# Patient Record
Sex: Male | Born: 1975
Health system: Southern US, Community
[De-identification: ages and names within clinical notes are randomized; demographics above are authoritative.]

## PROBLEM LIST (undated history)

## (undated) DIAGNOSIS — M1990 Unspecified osteoarthritis, unspecified site: Secondary | ICD-10-CM

## (undated) DIAGNOSIS — E669 Obesity, unspecified: Secondary | ICD-10-CM

## (undated) DIAGNOSIS — Z9989 Dependence on other enabling machines and devices: Secondary | ICD-10-CM

## (undated) DIAGNOSIS — Q336 Congenital hypoplasia and dysplasia of lung: Secondary | ICD-10-CM

## (undated) DIAGNOSIS — J302 Other seasonal allergic rhinitis: Secondary | ICD-10-CM

## (undated) DIAGNOSIS — I10 Essential (primary) hypertension: Secondary | ICD-10-CM

## (undated) DIAGNOSIS — R7301 Impaired fasting glucose: Secondary | ICD-10-CM

## (undated) DIAGNOSIS — E785 Hyperlipidemia, unspecified: Secondary | ICD-10-CM

## (undated) DIAGNOSIS — S8991XA Unspecified injury of right lower leg, initial encounter: Secondary | ICD-10-CM

## (undated) DIAGNOSIS — E119 Type 2 diabetes mellitus without complications: Secondary | ICD-10-CM

## (undated) HISTORY — DX: Obesity, unspecified: E66.9

## (undated) HISTORY — PX: OTHER SURGICAL HISTORY: SHX169

## (undated) HISTORY — DX: Impaired fasting glucose: R73.01

## (undated) HISTORY — DX: Hyperlipidemia, unspecified: E78.5

## (undated) HISTORY — DX: Essential (primary) hypertension: I10

## (undated) HISTORY — DX: Other seasonal allergic rhinitis: J30.2

## (undated) HISTORY — DX: Type 2 diabetes mellitus without complications: E11.9

## (undated) HISTORY — DX: Congenital hypoplasia and dysplasia of lung: Q33.6

---

## 2002-11-12 ENCOUNTER — Encounter: Payer: Self-pay | Admitting: Family Medicine

## 2002-11-12 ENCOUNTER — Ambulatory Visit (HOSPITAL_COMMUNITY): Admission: RE | Admit: 2002-11-12 | Discharge: 2002-11-12 | Payer: Self-pay | Admitting: Family Medicine

## 2004-03-09 ENCOUNTER — Ambulatory Visit: Payer: Self-pay | Admitting: Family Medicine

## 2004-05-10 ENCOUNTER — Ambulatory Visit: Payer: Self-pay | Admitting: Family Medicine

## 2004-09-12 ENCOUNTER — Ambulatory Visit: Payer: Self-pay | Admitting: Family Medicine

## 2004-10-20 ENCOUNTER — Ambulatory Visit: Payer: Self-pay | Admitting: Family Medicine

## 2005-01-03 ENCOUNTER — Ambulatory Visit: Payer: Self-pay | Admitting: Family Medicine

## 2005-01-25 ENCOUNTER — Ambulatory Visit: Payer: Self-pay | Admitting: Family Medicine

## 2005-03-06 HISTORY — PX: OTHER SURGICAL HISTORY: SHX169

## 2005-04-27 ENCOUNTER — Ambulatory Visit: Payer: Self-pay | Admitting: Family Medicine

## 2005-07-05 ENCOUNTER — Ambulatory Visit: Payer: Self-pay | Admitting: Family Medicine

## 2005-12-08 ENCOUNTER — Ambulatory Visit: Payer: Self-pay | Admitting: Family Medicine

## 2006-01-18 ENCOUNTER — Ambulatory Visit: Payer: Self-pay | Admitting: Family Medicine

## 2006-03-09 ENCOUNTER — Ambulatory Visit: Payer: Self-pay | Admitting: Family Medicine

## 2006-03-09 ENCOUNTER — Ambulatory Visit (HOSPITAL_COMMUNITY): Admission: RE | Admit: 2006-03-09 | Discharge: 2006-03-09 | Payer: Self-pay | Admitting: Family Medicine

## 2006-04-03 ENCOUNTER — Ambulatory Visit: Payer: Self-pay | Admitting: Family Medicine

## 2006-04-16 ENCOUNTER — Ambulatory Visit (HOSPITAL_COMMUNITY): Admission: RE | Admit: 2006-04-16 | Discharge: 2006-04-16 | Payer: Self-pay | Admitting: Family Medicine

## 2006-08-02 ENCOUNTER — Ambulatory Visit (HOSPITAL_COMMUNITY): Admission: RE | Admit: 2006-08-02 | Discharge: 2006-08-02 | Payer: Self-pay | Admitting: Family Medicine

## 2006-08-02 ENCOUNTER — Ambulatory Visit: Payer: Self-pay | Admitting: Family Medicine

## 2006-11-02 ENCOUNTER — Ambulatory Visit: Payer: Self-pay | Admitting: Family Medicine

## 2006-11-02 LAB — CONVERTED CEMR LAB
CO2: 23 meq/L (ref 19–32)
Calcium: 9.3 mg/dL (ref 8.4–10.5)
Chloride: 101 meq/L (ref 96–112)
Glucose, Bld: 93 mg/dL (ref 70–99)
LDL Cholesterol: 159 mg/dL — ABNORMAL HIGH (ref 0–99)
Sodium: 137 meq/L (ref 135–145)
Total CHOL/HDL Ratio: 5
VLDL: 16 mg/dL (ref 0–40)

## 2006-12-19 ENCOUNTER — Ambulatory Visit: Payer: Self-pay | Admitting: Family Medicine

## 2007-01-17 ENCOUNTER — Ambulatory Visit: Payer: Self-pay | Admitting: Family Medicine

## 2007-02-26 ENCOUNTER — Ambulatory Visit: Payer: Self-pay | Admitting: Family Medicine

## 2007-03-14 ENCOUNTER — Ambulatory Visit: Payer: Self-pay | Admitting: Family Medicine

## 2007-09-20 ENCOUNTER — Ambulatory Visit: Payer: Self-pay | Admitting: Family Medicine

## 2007-09-20 LAB — CONVERTED CEMR LAB
BUN: 10 mg/dL (ref 6–23)
CO2: 18 meq/L — ABNORMAL LOW (ref 19–32)
Chloride: 102 meq/L (ref 96–112)
Glucose, Bld: 92 mg/dL (ref 70–99)
LDL Cholesterol: 167 mg/dL — ABNORMAL HIGH (ref 0–99)
Lymphocytes Relative: 23 % (ref 12–46)
Lymphs Abs: 2.4 10*3/uL (ref 0.7–4.0)
MCV: 83.7 fL (ref 78.0–100.0)
Monocytes Relative: 6 % (ref 3–12)
Neutro Abs: 7.1 10*3/uL (ref 1.7–7.7)
Neutrophils Relative %: 68 % (ref 43–77)
Potassium: 4.1 meq/L (ref 3.5–5.3)
RBC: 5.03 M/uL (ref 4.22–5.81)
Sodium: 137 meq/L (ref 135–145)
Total CHOL/HDL Ratio: 4.9
VLDL: 12 mg/dL (ref 0–40)
WBC: 10.4 10*3/uL (ref 4.0–10.5)

## 2007-09-23 ENCOUNTER — Encounter: Payer: Self-pay | Admitting: Family Medicine

## 2007-09-23 DIAGNOSIS — J301 Allergic rhinitis due to pollen: Secondary | ICD-10-CM

## 2007-09-23 DIAGNOSIS — J309 Allergic rhinitis, unspecified: Secondary | ICD-10-CM | POA: Insufficient documentation

## 2007-09-23 DIAGNOSIS — I10 Essential (primary) hypertension: Secondary | ICD-10-CM | POA: Insufficient documentation

## 2007-09-23 DIAGNOSIS — E785 Hyperlipidemia, unspecified: Secondary | ICD-10-CM

## 2007-09-23 LAB — CONVERTED CEMR LAB
Albumin: 4.3 g/dL (ref 3.5–5.2)
Bilirubin, Direct: 0.4 mg/dL — ABNORMAL HIGH (ref 0.0–0.3)
Total Bilirubin: 0.4 mg/dL (ref 0.3–1.2)

## 2007-12-15 ENCOUNTER — Emergency Department (HOSPITAL_COMMUNITY): Admission: EM | Admit: 2007-12-15 | Discharge: 2007-12-15 | Payer: Self-pay | Admitting: Emergency Medicine

## 2007-12-27 ENCOUNTER — Ambulatory Visit: Payer: Self-pay | Admitting: Family Medicine

## 2007-12-30 ENCOUNTER — Encounter: Payer: Self-pay | Admitting: Family Medicine

## 2007-12-30 LAB — CONVERTED CEMR LAB
ALT: 21 units/L (ref 0–53)
AST: 20 units/L (ref 0–37)
Albumin: 4.1 g/dL (ref 3.5–5.2)
Bilirubin, Direct: <0.1 mg/dL (ref 0.0–0.3)
CO2: 23 meq/L (ref 19–32)
Calcium: 9.2 mg/dL (ref 8.4–10.5)
Cholesterol: 136 mg/dL (ref 0–200)
Glucose, Bld: 101 mg/dL — ABNORMAL HIGH (ref 70–99)
HDL: 47 mg/dL (ref >39)
Potassium: 4.3 meq/L (ref 3.5–5.3)
Sodium: 138 meq/L (ref 135–145)
Total Bilirubin: 0.5 mg/dL (ref 0.3–1.2)
Total CHOL/HDL Ratio: 2.9
VLDL: 14 mg/dL (ref 0–40)

## 2008-01-21 ENCOUNTER — Encounter: Payer: Self-pay | Admitting: Family Medicine

## 2008-01-29 ENCOUNTER — Encounter: Payer: Self-pay | Admitting: Family Medicine

## 2008-03-03 ENCOUNTER — Encounter: Payer: Self-pay | Admitting: Family Medicine

## 2008-04-27 ENCOUNTER — Encounter: Payer: Self-pay | Admitting: Family Medicine

## 2008-06-08 ENCOUNTER — Encounter: Payer: Self-pay | Admitting: Family Medicine

## 2008-07-06 ENCOUNTER — Ambulatory Visit: Payer: Self-pay | Admitting: Family Medicine

## 2008-07-06 DIAGNOSIS — J42 Unspecified chronic bronchitis: Secondary | ICD-10-CM | POA: Insufficient documentation

## 2008-08-05 ENCOUNTER — Encounter: Payer: Self-pay | Admitting: Family Medicine

## 2008-08-05 LAB — CONVERTED CEMR LAB
ALT: 11 units/L (ref 0–53)
AST: 12 units/L (ref 0–37)
Albumin: 3.7 g/dL (ref 3.5–5.2)
Bilirubin, Direct: 0.1 mg/dL (ref 0.0–0.3)
CO2: 25 meq/L (ref 19–32)
Calcium: 9.2 mg/dL (ref 8.4–10.5)
Cholesterol: 137 mg/dL (ref 0–200)
Glucose, Bld: 105 mg/dL — ABNORMAL HIGH (ref 70–99)
HDL: 49 mg/dL (ref >39)
Potassium: 4.5 meq/L (ref 3.5–5.3)
Sodium: 140 meq/L (ref 135–145)
Total CHOL/HDL Ratio: 2.8
VLDL: 9 mg/dL (ref 0–40)

## 2008-09-15 ENCOUNTER — Encounter: Payer: Self-pay | Admitting: Family Medicine

## 2008-10-06 ENCOUNTER — Ambulatory Visit: Payer: Self-pay | Admitting: Family Medicine

## 2008-10-11 DIAGNOSIS — F411 Generalized anxiety disorder: Secondary | ICD-10-CM

## 2008-11-19 ENCOUNTER — Telehealth: Payer: Self-pay | Admitting: Family Medicine

## 2008-12-24 ENCOUNTER — Ambulatory Visit: Payer: Self-pay | Admitting: Family Medicine

## 2009-02-03 ENCOUNTER — Encounter: Payer: Self-pay | Admitting: Family Medicine

## 2009-03-12 ENCOUNTER — Ambulatory Visit: Payer: Self-pay | Admitting: Family Medicine

## 2009-03-12 DIAGNOSIS — J209 Acute bronchitis, unspecified: Secondary | ICD-10-CM | POA: Insufficient documentation

## 2009-03-12 LAB — CONVERTED CEMR LAB
Albumin: 4 g/dL (ref 3.5–5.2)
Alkaline Phosphatase: 84 units/L (ref 39–117)
CO2: 27 meq/L (ref 19–32)
Chloride: 101 meq/L (ref 96–112)
Creatinine, Ser: 0.98 mg/dL (ref 0.40–1.50)
HDL: 50 mg/dL (ref >39)
LDL Cholesterol: 74 mg/dL (ref 0–99)
Lymphocytes Relative: 23 % (ref 12–46)
Lymphs Abs: 2.5 10*3/uL (ref 0.7–4.0)
Monocytes Relative: 8 % (ref 3–12)
Neutro Abs: 6.8 10*3/uL (ref 1.7–7.7)
Neutrophils Relative %: 63 % (ref 43–77)
Platelets: 321 10*3/uL (ref 150–400)
Potassium: 4.2 meq/L (ref 3.5–5.3)
RBC: 4.66 M/uL (ref 4.22–5.81)
Sodium: 140 meq/L (ref 135–145)
Total CHOL/HDL Ratio: 2.8
Total Protein: 7.9 g/dL (ref 6.0–8.3)
Triglycerides: 70 mg/dL (ref <150)
VLDL: 14 mg/dL (ref 0–40)
WBC: 10.9 10*3/uL — ABNORMAL HIGH (ref 4.0–10.5)

## 2009-03-15 ENCOUNTER — Encounter: Payer: Self-pay | Admitting: Family Medicine

## 2009-03-15 ENCOUNTER — Telehealth: Payer: Self-pay | Admitting: Family Medicine

## 2009-07-23 ENCOUNTER — Ambulatory Visit: Payer: Self-pay | Admitting: Family Medicine

## 2009-07-23 DIAGNOSIS — B369 Superficial mycosis, unspecified: Secondary | ICD-10-CM | POA: Insufficient documentation

## 2009-07-28 LAB — CONVERTED CEMR LAB
ALT: 11 units/L (ref 0–53)
Albumin: 3.8 g/dL (ref 3.5–5.2)
Basophils Absolute: 0 10*3/uL (ref 0.0–0.1)
Cholesterol: 193 mg/dL (ref 0–200)
Hemoglobin: 12.2 g/dL — ABNORMAL LOW (ref 13.0–17.0)
Hgb A1c MFr Bld: 6.5 % — ABNORMAL HIGH (ref <5.7)
Lymphocytes Relative: 24 % (ref 12–46)
Lymphs Abs: 2.8 10*3/uL (ref 0.7–4.0)
Monocytes Absolute: 0.7 10*3/uL (ref 0.1–1.0)
Neutro Abs: 7.5 10*3/uL (ref 1.7–7.7)
Potassium: 4.6 meq/L (ref 3.5–5.3)
RDW: 15.8 % — ABNORMAL HIGH (ref 11.5–15.5)
Sodium: 137 meq/L (ref 135–145)
Total CHOL/HDL Ratio: 4
Total Protein: 7.8 g/dL (ref 6.0–8.3)
Triglycerides: 57 mg/dL (ref <150)
VLDL: 11 mg/dL (ref 0–40)
Vit D, 25-Hydroxy: 11 ng/mL — ABNORMAL LOW (ref 30–89)
WBC: 11.5 10*3/uL — ABNORMAL HIGH (ref 4.0–10.5)

## 2009-11-19 ENCOUNTER — Ambulatory Visit: Payer: Self-pay | Admitting: Family Medicine

## 2009-12-08 ENCOUNTER — Ambulatory Visit: Payer: Self-pay | Admitting: Family Medicine

## 2009-12-09 ENCOUNTER — Telehealth: Payer: Self-pay | Admitting: Family Medicine

## 2009-12-09 DIAGNOSIS — R7301 Impaired fasting glucose: Secondary | ICD-10-CM | POA: Insufficient documentation

## 2009-12-09 HISTORY — DX: Impaired fasting glucose: R73.01

## 2009-12-09 LAB — CONVERTED CEMR LAB
AST: 15 units/L (ref 0–37)
Albumin: 4.1 g/dL (ref 3.5–5.2)
Alkaline Phosphatase: 87 units/L (ref 39–117)
Calcium: 9.5 mg/dL (ref 8.4–10.5)
Cholesterol: 140 mg/dL (ref 0–200)
Creatinine, Ser: 1.06 mg/dL (ref 0.40–1.50)
HDL: 45 mg/dL (ref >39)
Total Bilirubin: 0.5 mg/dL (ref 0.3–1.2)
Total Protein: 8.2 g/dL (ref 6.0–8.3)
Triglycerides: 64 mg/dL (ref <150)

## 2009-12-17 ENCOUNTER — Telehealth: Payer: Self-pay | Admitting: Family Medicine

## 2009-12-21 ENCOUNTER — Encounter: Payer: Self-pay | Admitting: Family Medicine

## 2009-12-24 ENCOUNTER — Encounter: Payer: Self-pay | Admitting: Family Medicine

## 2009-12-27 ENCOUNTER — Telehealth (INDEPENDENT_AMBULATORY_CARE_PROVIDER_SITE_OTHER): Payer: Self-pay | Admitting: *Deleted

## 2010-04-05 NOTE — Assessment & Plan Note (Signed)
Summary: office visit   Vital Signs:  Patient profile:   35 year old male Height:      66.5 inches Weight:      366.75 pounds BMI:     58.52 O2 Sat:      97 % on Room air Pulse rate:   86 / minute Pulse rhythm:   regular Resp:     16 per minute BP sitting:   104 / 68  (left arm)  Vitals Entered By: Adella Hare LPN (Jul 23, 2009 11:48 AM)  Nutrition Counseling: Patient's BMI is greater than 25 and therefore counseled on weight management options.  O2 Flow:  Room air CC: follow-up visit Is Patient Diabetic? No Pain Assessment Patient in pain? no        Primary Care Provider:  Syliva Overman MD  CC:  follow-up visit.  History of Present Illness: Reports  that he has been  doing well. Denies recent fever or chills. Denies sinus pressure, nasal congestion , ear pain or sore throat. Denies chest congestion, or cough productive of sputum. Denies chest pain, palpitations, PND, orthopnea or leg swelling. Denies abdominal pain, nausea, vomitting, diarrhea or constipation. Denies change in bowel movements or bloody stool. Denies dysuria , frequency, incontinence or hesitancy. Denies  joint pain, swelling, or reduced mobility. Denies headaches, vertigo, seizures. Denies depression, anxiety or insomnia. Denies  rash, lesions, or itch. [place] is still not walking regularly, neither jhas he modified his diet, he unfortunately has gaine d weight in the past severa;l mths.     Current Medications (verified): 1)  Hydrochlorothiazide 25 Mg Tabs (Hydrochlorothiazide) .... One Tab By Mouth Qd 2)  Klor-Con M20 20 Meq Cr-Tabs (Potassium Chloride Crys Cr) .... One Tab By Mouth Qd 3)  Loratadine 10 Mg Tabs (Loratadine) .... One Tab By Mouth Qd 4)  Nebulizing Machine 5)  Buspar 5 Mg Tabs (Buspirone Hcl) .... Take 1 Tablet By Mouth Three Times A Day 6)  Atrovent 0.03 % Soln (Ipratropium Bromide) .... One Inhalation Every 6 To 8 Hors As Needed 7)  Albuterol Sulfate (2.5 Mg/30ml)  0.083% Nebu (Albuterol Sulfate) .... One Vial Per Nebulizer Every 6 To 8 Hous Prn 8)  Simvastatin 40 Mg Tabs (Simvastatin) .... One Tab By Mouth At Bedtime  Allergies (verified): 1)  ! Sulfa  Review of Systems      See HPI Eyes:  Denies blurring and discharge. Derm:  Complains of itching, lesion(s), and rash; left leg hyperpigmented rash for years. Psych:  Complains of anxiety; anxiety uncontrolled on buspar, jumps at sounds. Endo:  Denies excessive thirst and excessive urination. Heme:  Denies abnormal bruising and bleeding. Allergy:  Denies hives or rash and itching eyes.  Physical Exam  General:  Well-developed,morbidly obese,in no acute distress; alert,appropriate and cooperative throughout examination HEENT: No facial asymmetry,  EOMI, No sinus tenderness, TM's Clear, oropharynx  pink and moist.   Chest: decreased air entry, bilateral crackles CVS: S1, S2, No murmurs, No S3.   Abd: Soft, Nontender.  MS: decreased  ROM spine, hips,  and knees.  Ext: No edema.   CNS: CN 2-12 intact, power tone and sensation normal throughout.   Skin: Intact, hyperpigmented maculop[apukar lesion on anterior right leg  Psych: Good eye contact, normal affect.  Memory intact, not anxious or depressed appearing.    Impression & Recommendations:  Problem # 1:  DERMATITIS (ICD-692.9) Assessment Comment Only  His updated medication list for this problem includes:    Loratadine 10 Mg Tabs (Loratadine) .Marland KitchenMarland KitchenMarland KitchenMarland Kitchen  One tab by mouth qd  Orders: Dermatology Referral (Derma)  Problem # 2:  GENERALIZED ANXIETY DISORDER (ICD-300.02) Assessment: Deteriorated  The following medications were removed from the medication list:    Buspar 5 Mg Tabs (Buspirone hcl) .Marland Kitchen... Take 1 tablet by mouth three times a day His updated medication list for this problem includes:    Buspirone Hcl 7.5 Mg Tabs (Buspirone hcl) .Marland Kitchen... Take 1 tablet by mouth two times a day  Problem # 3:  HYPERLIPIDEMIA (ICD-272.4) Assessment:  Comment Only  The following medications were removed from the medication list:    Zocor 40 Mg Tabs (Simvastatin) .Marland Kitchen... Take 1 tab by mouth at bedtime His updated medication list for this problem includes:    Simvastatin 40 Mg Tabs (Simvastatin) ..... One tab by mouth at bedtime  Orders: T-Lipid Profile [id number]) T-Hepatic Function [id number])  Labs Reviewed: SGOT: 17 (03/12/2009)   SGPT: 15 (03/12/2009)   HDL:50 (03/12/2009), 49 (08/05/2008)  LDL:74 (03/12/2009), 79 (08/05/2008)  Chol:138 (03/12/2009), 137 (08/05/2008)  Trig:70 (03/12/2009), 46 (08/05/2008)  Problem # 4:  HYPERTENSION (ICD-401.9) Assessment: Unchanged  His updated medication list for this problem includes:    Hydrochlorothiazide 25 Mg Tabs (Hydrochlorothiazide) ..... One tab by mouth qd  Orders: T-Basic Metabolic Panel [phone])  BP today: 104/68 Prior BP: 120/84 (03/12/2009)  Labs Reviewed: K+: 4.2 (03/12/2009) Creat: : 0.98 (03/12/2009)   Chol: 138 (03/12/2009)   HDL: 50 (03/12/2009)   LDL: 74 (03/12/2009)   TG: 70 (03/12/2009)  Problem # 5:  OBESITY (ICD-278.00) Assessment: Deteriorated  Ht: 66.5 (07/23/2009)   Wt: 366.75 (07/23/2009)   BMI: 58.52 (07/23/2009)  Complete Medication List: 1)  Hydrochlorothiazide 25 Mg Tabs (Hydrochlorothiazide) .... One tab by mouth qd 2)  Klor-con M20 20 Meq Cr-tabs (Potassium chloride crys cr) .... One tab by mouth qd 3)  Loratadine 10 Mg Tabs (Loratadine) .... One tab by mouth qd 4)  Nebulizing Machine  5)  Atrovent 0.03 % Soln (Ipratropium bromide) .... One inhalation every 6 to 8 hors as needed 6)  Albuterol Sulfate (2.5 Mg/98ml) 0.083% Nebu (Albuterol sulfate) .... One vial per nebulizer every 6 to 8 hous prn 7)  Simvastatin 40 Mg Tabs (Simvastatin) .... One tab by mouth at bedtime 8)  Buspirone Hcl 7.5 Mg Tabs (Buspirone hcl) .... Take 1 tablet by mouth two times a day  Other Orders: T-CBC w/Diff ([id number]) T- Hemoglobin A1C ([id number]) T-Vitamin  D (25-Hydroxy) ([id number])  Patient Instructions: 1)  Please schedule a follow-up appointment in 4.5 months. 2)  It is important that you exercise regularly at least 20 minutes 5 times a week. If you develop chest pain, have severe difficulty breathing, or feel very tired , stop exercising immediately and seek medical attention. 3)  You need to lose weight. Consider a lower calorie diet and regular exercise.  4)  BMP prior to visit, ICD-9: 5)  Hepatic Panel prior to visit, ICD-9: 6)  Lipid Panel prior to visit, ICD-9:   fasting today 7)  CBC w/ Diff prior to visit, ICD-9: 8)  HbgA1C prior to visit, ICD-9: 9)  Vitamin D 10)  You will be referred to skin doc next month Prescriptions: BUSPIRONE HCL 7.5 MG TABS (BUSPIRONE HCL) Take 1 tablet by mouth two times a day  #60 x 3   Entered by:   Adella Hare LPN   Authorized by:   Syliva Overman MD   Signed by:   Adella Hare LPN on 43/32/9518   Method used:   Printed then faxed  to ...       [facility]* (retail)       [address]       [place], [place]  [address]       Ph: [phone]       Fax: [phone]   RxID:   [id number] BUSPIRONE HCL 7.5 MG TABS (BUSPIRONE HCL) Take 1 tablet by mouth two times a day  #60 x 3   Entered and Authorized by:   Syliva Overman MD   Signed by:   Syliva Overman MD on 07/23/2009   Method used:   Electronically to        [facility]* (retail)       [address]       [place], [place]  [address]       Ph: [phone]       Fax: [phone]   RxID:   [id number]

## 2010-04-05 NOTE — Progress Notes (Signed)
  Phone Note From Pharmacy   Caller: [facility]* Summary of Call: medicaid will not pay for ipratropium/albuterol (duoneb)  ** however it will pay for the two seperately. Initial call taken by: Worthy Keeler LPN,  March 15, 2009 10:15 AM  Follow-up for Phone Call        substitue has been sent in let pt  know  Follow-up by: Syliva Overman MD,  March 15, 2009 12:23 PM  Additional Follow-up for Phone Call Additional follow up Details #1::        patient mother aware Additional Follow-up by: Worthy Keeler LPN,  March 15, 2009 2:13 PM    New/Updated Medications: ALBUTEROL SULFATE 1.25 MG/3ML NEBU (ALBUTEROL SULFATE) one inhalatuion every 6 to 8 hours as needed ATROVENT 0.03 % SOLN (IPRATROPIUM BROMIDE) one inhalation every 6 to 8 hors as needed Prescriptions: ATROVENT 0.03 % SOLN (IPRATROPIUM BROMIDE) one inhalation every 6 to 8 hors as needed  #120 x 4   Entered and Authorized by:   Syliva Overman MD   Signed by:   Syliva Overman MD on 03/15/2009   Method used:   Electronically to        [facility]* (retail)       [address]       [place], [place]  [address]       Ph: [phone]       Fax: [phone]   RxID:   [id number] ALBUTEROL SULFATE 1.25 MG/3ML NEBU (ALBUTEROL SULFATE) one inhalatuion every 6 to 8 hours as needed  #120 x 4   Entered and Authorized by:   Syliva Overman MD   Signed by:   Syliva Overman MD on 03/15/2009   Method used:   Electronically to        [facility]* (retail)       [address]       [place], [place]  [address]       Ph: [phone]       Fax: [phone]   RxID:   [id number]

## 2010-04-05 NOTE — Progress Notes (Signed)
  Phone Note Call from Patient   Summary of Call: Wanted to make you aware that patient was requesting refill on Simvastatin 80mg . He was here on 10/5 and it was left on his medlist. Should he still be taking or changed? [facility] Initial call taken by: Everitt Amber LPN,  December 17, 2009 2:28 PM  Follow-up for Phone Call        plThanks pls fax the new script entered for 40mg  , d/c 80mg  , and also let mom know Follow-up by: Syliva Overman MD,  December 17, 2009 3:12 PM  Additional Follow-up for Phone Call Additional follow up Details #1::        called mom, left message, wrote d/c simvastatin 80 mg and faxed in. Wrote on rx to notify patient of change on pickup Additional Follow-up by: Everitt Amber LPN,  December 17, 2009 3:20 PM    New/Updated Medications: SIMVASTATIN 40 MG TABS (SIMVASTATIN) Take 1 tab by mouth at bedtime Prescriptions: SIMVASTATIN 40 MG TABS (SIMVASTATIN) Take 1 tab by mouth at bedtime  #30 x 4   Entered by:   Everitt Amber LPN   Authorized by:   Syliva Overman MD   Signed by:   Everitt Amber LPN on 78/46/9629   Method used:   Printed then faxed to ...       [facility]* (retail)       [address]       [place], [place]  [address]       Ph: [phone]       Fax: [phone]   RxID:   [id number] SIMVASTATIN 40 MG TABS (SIMVASTATIN) Take 1 tab by mouth at bedtime  #30 x 4   Entered and Authorized by:   Syliva Overman MD   Signed by:   Syliva Overman MD on 12/17/2009   Method used:   Historical   RxID:   [id number]

## 2010-04-05 NOTE — Assessment & Plan Note (Signed)
Summary: flu shot  Nurse Visit   Allergies: 1)  ! Sulfa  Immunizations Administered:  Influenza Vaccine # 1:    Vaccine Type: Fluvax Non-MCR    Site: right deltoid    Mfr: novartis    Dose: 0.5 ml    Route: IM    Given by: Adella Hare LPN    Exp. Date: 07/2010    Lot #: [id number]    VIS given: 09/28/09 version given November 19, 2009.  Orders Added: 1)  Influenza Vaccine NON MCR [00028]

## 2010-04-05 NOTE — Miscellaneous (Signed)
Summary: refill  Clinical Lists Changes  Medications: Removed medication of ALBUTEROL SULFATE 1.25 MG/3ML NEBU (ALBUTEROL SULFATE) one inhalatuion every 6 to 8 hours as needed Added new medication of ALBUTEROL SULFATE (2.5 MG/3ML) 0.083% NEBU (ALBUTEROL SULFATE) one vial per nebulizer every 6 to 8 hous prn - Signed Rx of ALBUTEROL SULFATE (2.5 MG/3ML) 0.083% NEBU (ALBUTEROL SULFATE) one vial per nebulizer every 6 to 8 hous prn;  #336ml x 2;  Signed;  Entered by: Worthy Keeler LPN;  Authorized by: Syliva Overman MD;  Method used: [facility] to [facility] [address], [place], [place], [place]  [address], Ph: [phone], Fax: [phone]    Prescriptions: ALBUTEROL SULFATE (2.5 MG/3ML) 0.083% NEBU (ALBUTEROL SULFATE) one vial per nebulizer every 6 to 8 hous prn  #316ml x 2   Entered by:   Worthy Keeler LPN   Authorized by:   Syliva Overman MD   Signed by:   Worthy Keeler LPN on 78/46/9629   Method used:   Electronically to        [facility]* (retail)       [address]       [place], [place]  [address]       Ph: [phone]       Fax: [phone]   RxID:   [id number]

## 2010-04-05 NOTE — Progress Notes (Signed)
Summary: results of lab work  Phone Note Call from Patient   Summary of Call: Pt would like to get results of lab work [phone] Initial call taken by: Rudene Anda,  December 09, 2009 11:10 AM  Follow-up for Phone Call        Patients mom aware Follow-up by: Everitt Amber LPN,  December 09, 2009 11:47 AM  New Problems: IMPAIRED FASTING GLUCOSE (ICD-790.21)   New Problems: IMPAIRED FASTING GLUCOSE (ICD-790.21)

## 2010-04-05 NOTE — Progress Notes (Signed)
  Phone Note Call from Patient   Summary of Call: Patient's mother Rosey Bath called and wants to know his blood work results.  [phone] Initial call taken by: Rosine Beat,  December 27, 2009 12:08 PM  Follow-up for Phone Call        Patient's mother aware Follow-up by: Mauricia Area CMA,  December 27, 2009 2:13 PM

## 2010-04-05 NOTE — Assessment & Plan Note (Signed)
Summary: 5 month follow up/cnd   Vital Signs:  Patient profile:   35 year old male Height:      66.5 inches Weight:      359.75 pounds BMI:     57.40 O2 Sat:      94 % Pulse rate:   54 / minute Pulse rhythm:   regular Resp:     16 per minute BP sitting:   120 / 84 Cuff size:   xl  Vitals Entered By: Everitt Amber (March 12, 2009 11:24 AM)  Nutrition Counseling: Patient's BMI is greater than 25 and therefore counseled on weight management options. CC: Follow up chronic problems   Primary Care Provider:  Syliva Overman MD  CC:  Follow up chronic problems.  History of Present Illness: 2 week h/o chest congestion and cough productive of green sputum with chills. Reports  that he had been doing well prior to this. Denies recent fever or chills. Denies sinus pressure, nasal congestion , ear pain or sore throat.  Denies chest pain, palpitations, PND, orthopnea or leg swelling. Denies abdominal pain, nausea, vomitting, diarrhea or constipation. Denies change in bowel movements or bloody stool. Denies dysuria , frequency, incontinence or hesitancy. Denies  joint pain, swelling, or reduced mobility. Denies headaches, vertigo, seizures. Denies depression, or insomnia.Mother still reprts excesssive "nervousness", reportedly "jumps" often with little stimulation. Denies  rash, lesions, or itch.     Current Medications (verified): 1)  Hydrochlorothiazide 25 Mg Tabs (Hydrochlorothiazide) .... One Tab By Mouth Qd 2)  Klor-Con M20 20 Meq Cr-Tabs (Potassium Chloride Crys Cr) .... One Tab By Mouth Qd 3)  Loratadine 10 Mg Tabs (Loratadine) .... One Tab By Mouth Qd 4)  Zocor 40 Mg Tabs (Simvastatin) .... Take 1 Tab By Mouth At Bedtime 5)  Duoneb 0.5-2.5 (3) Mg/49ml Soln (Ipratropium-Albuterol) .... Use With Nebulizing Machine Every 6-8 Hours As Needed 6)  Nebulizing Machine 7)  Buspar 5 Mg Tabs (Buspirone Hcl) .... Take 1 Tablet By Mouth Two Times A Day  Allergies (verified): 1)  !  Sulfa  Review of Systems      See HPI Eyes:  Denies blurring and discharge. Neuro:  Denies headaches, seizures, and sensation of room spinning. Heme:  Denies abnormal bruising and bleeding. Allergy:  Complains of seasonal allergies; denies hives or rash and itching eyes.  Physical Exam  General:  Well-developed,morbidly obese,in no acute distress; alert,appropriate and cooperative throughout examination HEENT: No facial asymmetry,  EOMI, No sinus tenderness, TM's Clear, oropharynx  pink and moist.   Chest: decreased air entry, bilateral crackles CVS: S1, S2, No murmurs, No S3.   Abd: Soft, Nontender.  MS: decreased  ROM spine, hips,  and knees.  Ext: No edema.   CNS: CN 2-12 intact, power tone and sensation normal throughout.   Skin: Intact, no visible lesions or rashes.  Psych: Good eye contact, normal affect.  Memory intact, not anxious or depressed appearing.    Impression & Recommendations:  Problem # 1:  ACUTE BRONCHITIS (ICD-466.0) Assessment Comment Only  His updated medication list for this problem includes:    Duoneb 0.5-2.5 (3) Mg/63ml Soln (Ipratropium-albuterol) ..... Use with nebulizing machine every 6-8 hours as needed    Veetids 500 Mg Tabs (Penicillin v potassium) .Marland Kitchen... Take 1 tablet by mouth three times a day  Orders: T-CBC w/Diff ([id number]) Rocephin  250mg  (N0272) Admin of Therapeutic Inj  intramuscular or subcutaneous ([address])  Problem # 2:  GENERALIZED ANXIETY DISORDER (ICD-300.02) Assessment: Unchanged  The following medications  were removed from the medication list:    Buspar 5 Mg Tabs (Buspirone hcl) .Marland Kitchen... Take 1 tablet by mouth two times a day His updated medication list for this problem includes:    Buspar 5 Mg Tabs (Buspirone hcl) .Marland Kitchen... Take 1 tablet by mouth three times a day  Problem # 3:  HYPERLIPIDEMIA (ICD-272.4) Assessment: Comment Only  His updated medication list for this problem includes:    Zocor 40 Mg Tabs (Simvastatin) .Marland Kitchen...  Take 1 tab by mouth at bedtime  Orders: T-Lipid Profile [id number]) T-Hepatic Function [id number])  Labs Reviewed: SGOT: 12 (08/05/2008)   SGPT: 11 (08/05/2008)   HDL:49 (08/05/2008), 47 (12/27/2007)  LDL:79 (08/05/2008), 75 (12/27/2007)  Chol:137 (08/05/2008), 136 (12/27/2007)  Trig:46 (08/05/2008), 70 (12/27/2007)  Problem # 4:  OBESITY (ICD-278.00) Assessment: Improved  Ht: 66.5 (03/12/2009)   Wt: 359.75 (03/12/2009)   BMI: 57.40 (03/12/2009)  Problem # 5:  HYPERTENSION (ICD-401.9) Assessment: Unchanged  His updated medication list for this problem includes:    Hydrochlorothiazide 25 Mg Tabs (Hydrochlorothiazide) ..... One tab by mouth qd  Orders: T-Basic Metabolic Panel [id number])  BP today: 120/84 Prior BP: 110/80 (10/06/2008)  Labs Reviewed: K+: 4.5 (08/05/2008) Creat: : 0.98 (08/05/2008)   Chol: 137 (08/05/2008)   HDL: 49 (08/05/2008)   LDL: 79 (08/05/2008)   TG: 46 (08/05/2008)  Problem # 6:  ALLERGIC RHINITIS, SEASONAL (ICD-477.0) Assessment: Unchanged continue loratiine  Complete Medication List: 1)  Hydrochlorothiazide 25 Mg Tabs (Hydrochlorothiazide) .... One tab by mouth qd 2)  Klor-con M20 20 Meq Cr-tabs (Potassium chloride crys cr) .... One tab by mouth qd 3)  Loratadine 10 Mg Tabs (Loratadine) .... One tab by mouth qd 4)  Zocor 40 Mg Tabs (Simvastatin) .... Take 1 tab by mouth at bedtime 5)  Duoneb 0.5-2.5 (3) Mg/109ml Soln (Ipratropium-albuterol) .... Use with nebulizing machine every 6-8 hours as needed 6)  Nebulizing Machine  7)  Veetids 500 Mg Tabs (Penicillin v potassium) .... Take 1 tablet by mouth three times a day 8)  Buspar 5 Mg Tabs (Buspirone hcl) .... Take 1 tablet by mouth three times a day  Patient Instructions: 1)  Please schedule a follow-up appointment in 4 months. 2)  It is important that you exercise regularly at least 20 minutes 5 times a week. If you develop chest pain, have severe difficulty breathing, or feel very tired , stop  exercising immediately and seek medical attention. 3)  You need to lose weight. Consider a lower calorie diet and regular exercise.  4)  you are being treated for acute bronchitis. 5)  fasting  6)  BMP prior to visit, ICD-9: 7)  Hepatic Panel prior to visit, ICD-9:  today 8)  Lipid Panel prior to visit, ICD-9: 9)  CBC w/ Diff prior to visit, ICD-9: Prescriptions: ZOCOR 40 MG TABS (SIMVASTATIN) Take 1 tab by mouth at bedtime  #30 x 3   Entered by:   Worthy Keeler LPN   Authorized by:   Syliva Overman MD   Signed by:   Worthy Keeler LPN on 71/08/2692   Method used:   Electronically to        [facility]* (retail)       [address]       [place], [place]  [address]       Ph: [phone]       Fax: [phone]   RxID:   [id number] DUONEB 0.5-2.5 (3) MG/3ML SOLN (IPRATROPIUM-ALBUTEROL) Use  with nebulizing machine every 6-8 hours as needed  #1 month x 3   Entered by:   Worthy Keeler LPN   Authorized by:   Syliva Overman MD   Signed by:   Worthy Keeler LPN on 16/12/9602   Method used:   Electronically to        [facility]* (retail)       [address]       [place], [place]  [address]       Ph: [phone]       Fax: [phone]   RxID:   [id number] LORATADINE 10 MG TABS (LORATADINE) one tab by mouth qd  #30 x 3   Entered by:   Worthy Keeler LPN   Authorized by:   Syliva Overman MD   Signed by:   Worthy Keeler LPN on 12/31/2534   Method used:   Electronically to        [facility]* (retail)       [address]       [place], [place]  [address]       Ph: [phone]       Fax: [phone]   RxID:   [id number] HYDROCHLOROTHIAZIDE 25 MG TABS (HYDROCHLOROTHIAZIDE) one tab by mouth qd  #30 x 3   Entered by:   Worthy Keeler LPN   Authorized by:   Syliva Overman MD   Signed by:   Worthy Keeler LPN on 32/35/5732   Method used:    Electronically to        [facility]* (retail)       [address]       [place], [place]  [address]       Ph: [phone]       Fax: [phone]   RxID:   [id number] BUSPAR 5 MG TABS (BUSPIRONE HCL) Take 1 tablet by mouth three times a day  #90 x 4   Entered by:   Everitt Amber   Authorized by:   Syliva Overman MD   Signed by:   Everitt Amber on 03/12/2009   Method used:   Printed then faxed to ...       [facility]* (retail)       [address]       [place], [place]  [address]       Ph: [phone]       Fax: [phone]   RxID:   [id number] VEETIDS 500 MG TABS (PENICILLIN V POTASSIUM) Take 1 tablet by mouth three times a day  #30 x 0   Entered by:   Everitt Amber   Authorized by:   Syliva Overman MD   Signed by:   Everitt Amber on 03/12/2009   Method used:   Electronically to        [facility]* (retail)       [address]       [place], [place]  [address]       Ph: [phone]       Fax: [phone]   RxID:   [id number]    Medication Administration  Injection # 1:    Medication: Rocephin  250mg     Diagnosis: ACUTE BRONCHITIS (ICD-466.0)  Route: IM    Site: R deltoid    Exp Date: 9/12    Lot #: [id number]    Mfr: sandoz    Patient tolerated injection without complications    Given by: Worthy Keeler LPN (March 12, 2009 2:39 PM)  Orders Added: 1)  Est. Patient Level IV [99214] 2)  T-Basic Metabolic Panel [id number] 3)  T-Lipid Profile [[id number]] 4)  T-Hepatic Function [80076-22960] 5)  T-CBC w/Diff [[id number]] 6)  Rocephin  250mg  [J0696] 7)  Admin of Therapeutic Inj  intramuscular or subcutaneous [62952]

## 2010-04-05 NOTE — Letter (Signed)
Summary: SIMVASTATIN  SIMVASTATIN   Imported By: Lind Guest 12/21/2009 08:29:20  _____________________________________________________________________  External Attachment:    Type:   Image     Comment:   External Document

## 2010-04-05 NOTE — Assessment & Plan Note (Signed)
Summary: office visit   Vital Signs:  Patient profile:   35 year old male Height:      66.5 inches Weight:      371.50 pounds BMI:     59.28 O2 Sat:      96 % on Room air Pulse rhythm:   regular Resp:     16 per minute BP sitting:   118 / 82  (left arm)  Vitals Entered By: Mauricia Area CMA  Nutrition Counseling: Patient's BMI is greater than 25 and therefore counseled on weight management options.  O2 Flow:  Room air CC: follow up Is Patient Diabetic? No Pain Assessment Patient in pain? no      Comments patient did not bring meds   Primary Care Marquist Binstock:  Syliva Overman MD  CC:  follow up.  History of Present Illness: Reports  that he has been doing well. Denies recent fever or chills. Denies sinus pressure, nasal congestion , ear pain or sore throat. Denies chest congestion, or cough productive of sputum. Denies chest pain, palpitations, PND, orthopnea or leg swelling. Denies abdominal pain, nausea, vomitting, diarrhea or constipation. Denies change in bowel movements or bloody stool. Denies dysuria , frequency, incontinence or hesitancy. Denies  joint pain, swelling, or reduced mobility. Denies headaches, vertigo, seizures. Denies depression, anxiety or insomnia. Denies  rash, lesions, or itch. Jorja Loa is making no effort at reducing intake or exercising and continues to gain weight     Allergies: 1)  ! Sulfa  Review of Systems      See HPI General:  Complains of fatigue. Eyes:  Denies blurring and discharge. Endo:  Denies excessive thirst and excessive urination. Heme:  Denies abnormal bruising and bleeding. Allergy:  Complains of seasonal allergies; denies hives or rash and itching eyes.  Physical Exam  General:  Well-developed,morbidly obese,in no acute distress; alert,appropriate and cooperative throughout examination HEENT: No facial asymmetry,  EOMI, No sinus tenderness, TM's Clear, oropharynx  pink and moist.   Chest: decreased air entry,  bilateral crackles CVS: S1, S2, No murmurs, No S3.   Abd: Soft, Nontender.  MS: decreased  ROM spine, hips,  and knees.  Ext: No edema.   CNS: CN 2-12 intact, power tone and sensation normal throughout.   Skin: Intact, hyperpigmented maculop[apukar lesion on anterior right leg  Psych: Good eye contact, normal affect.  Memory intact, not anxious or depressed appearing.    Impression & Recommendations:  Problem # 1:  GENERALIZED ANXIETY DISORDER (ICD-300.02) Assessment Improved  His updated medication list for this problem includes:    Buspirone Hcl 7.5 Mg Tabs (Buspirone hcl) .Marland Kitchen... Take 1 tablet by mouth two times a day  Problem # 2:  HYPERLIPIDEMIA (ICD-272.4) Assessment: Comment Only  His updated medication list for this problem includes:    Simvastatin 80 Mg Tabs (Simvastatin) .Marland Kitchen... Take 1 tab by mouth at bedtime Low fat diet discussed and encouraged, and literature also given Will plan to reduce dose of simvastatin Orders: T-Hepatic Function [id number]) T-Lipid Profile [id number])  Labs Reviewed: SGOT: 15 (07/23/2009)   SGPT: 11 (07/23/2009)   HDL:48 (07/23/2009), 50 (03/12/2009)  LDL:134 (07/23/2009), 74 (29/56/2130)  Chol:193 (07/23/2009), 138 (03/12/2009)  Trig:57 (07/23/2009), 70 (03/12/2009)  Problem # 3:  OBESITY (ICD-278.00) Assessment: Deteriorated  Ht: 66.5 (12/08/2009)   Wt: 371.50 (12/08/2009)   BMI: 59.28 (12/08/2009) therapeutic lifestyle change discussed and encouraged  Problem # 4:  HYPERTENSION (ICD-401.9) Assessment: Unchanged  His updated medication list for this problem includes:    Hydrochlorothiazide 25  Mg Tabs (Hydrochlorothiazide) ..... One tab by mouth qd  Orders: T-Basic Metabolic Panel ([id number])  BP today: 118/82 Prior BP: 104/68 (07/23/2009)  Labs Reviewed: K+: 4.6 (07/23/2009) Creat: : 1.07 (07/23/2009)   Chol: 193 (07/23/2009)   HDL: 48 (07/23/2009)   LDL: 134 (07/23/2009)   TG: 57 (07/23/2009)  Complete Medication  List: 1)  Hydrochlorothiazide 25 Mg Tabs (Hydrochlorothiazide) .... One tab by mouth qd 2)  Klor-con M20 20 Meq Cr-tabs (Potassium chloride crys cr) .... One tab by mouth qd 3)  Loratadine 10 Mg Tabs (Loratadine) .... One tab by mouth qd 4)  Nebulizing Machine  5)  Atrovent 0.03 % Soln (Ipratropium bromide) .... One inhalation every 6 to 8 hors as needed 6)  Albuterol Sulfate (2.5 Mg/67ml) 0.083% Nebu (Albuterol sulfate) .... One vial per nebulizer every 6 to 8 hous prn 7)  Simvastatin 80 Mg Tabs (Simvastatin) .... Take 1 tab by mouth at bedtime 8)  Buspirone Hcl 7.5 Mg Tabs (Buspirone hcl) .... Take 1 tablet by mouth two times a day 9)  Vitamin D (ergocalciferol) 50000 Unit Caps (Ergocalciferol) .... One tab once a week  Patient Instructions: 1)  Please schedule a follow-up appointment in 4.54months. 2)  It is important that you exercise regularly at least 20 minutes 5 times a week. If you develop chest pain, have severe difficulty breathing, or feel very tired , stop exercising immediately and seek medical attention. 3)  You need to lose weight. Consider a lower calorie diet and regular exercise. pLS lose weight, eat less 4)  BMP prior to visit, ICD-9: 5)  Hepatic Panel prior to visit, ICD-9:   fasting today 6)  HbgA1C prior to visit, ICD-9:

## 2010-04-29 ENCOUNTER — Ambulatory Visit (INDEPENDENT_AMBULATORY_CARE_PROVIDER_SITE_OTHER): Payer: Medicaid Other | Admitting: Family Medicine

## 2010-04-29 ENCOUNTER — Encounter: Payer: Self-pay | Admitting: Family Medicine

## 2010-04-29 DIAGNOSIS — E669 Obesity, unspecified: Secondary | ICD-10-CM

## 2010-04-29 DIAGNOSIS — I10 Essential (primary) hypertension: Secondary | ICD-10-CM

## 2010-05-03 ENCOUNTER — Encounter: Payer: Self-pay | Admitting: Family Medicine

## 2010-05-03 LAB — CONVERTED CEMR LAB
Calcium: 9.6 mg/dL (ref 8.4–10.5)
Free T4: 1.12 ng/dL (ref 0.80–1.80)
Hgb A1c MFr Bld: 7.7 % — ABNORMAL HIGH (ref <5.7)
Sodium: 137 meq/L (ref 135–145)
T3, Free: 3 pg/mL (ref 2.3–4.2)
TSH: 4.826 microintl units/mL — ABNORMAL HIGH (ref 0.350–4.500)

## 2010-05-03 NOTE — Assessment & Plan Note (Signed)
Summary: F UP   Vital Signs:  Patient profile:   35 year old male Height:      66.5 inches Weight:      369.75 pounds BMI:     59.00 O2 Sat:      95 % on Room air Pulse rate:   87 / minute Pulse rhythm:   regular Resp:     16 per minute BP sitting:   102 / 64  (left arm)  Vitals Entered By: Adella Hare LPN (April 29, 2010 11:22 AM)  Nutrition Counseling: Patient's BMI is greater than 25 and therefore counseled on weight management options.  O2 Flow:  Room air CC: follow-up visit Is Patient Diabetic? No   Primary Care Provider:  Syliva Overman MD  CC:  follow-up visit.  History of Present Illness: Reports  that he is doing well, he denies any problems. Denies recent fever or chills. Denies sinus pressure, nasal congestion , ear pain or sore throat. Denies chest congestion, or cough productive of sputum. Denies chest pain, palpitations, PND, orthopnea or leg swelling. Denies abdominal pain, nausea, vomitting, diarrhea or constipation. Denies change in bowel movements or bloody stool. Denies dysuria , frequency, incontinence or hesitancy. Denies  joint pain, swelling, or reduced mobility. Denies headaches, vertigo, seizures. Denies depression, anxiety or insomnia. Denies  rash, lesions, or itch.     Current Medications (verified): 1)  Hydrochlorothiazide 25 Mg Tabs (Hydrochlorothiazide) .... One Tab By Mouth Qd 2)  Klor-Con M20 20 Meq Cr-Tabs (Potassium Chloride Crys Cr) .... One Tab By Mouth Qd 3)  Loratadine 10 Mg Tabs (Loratadine) .... One Tab By Mouth Qd 4)  Nebulizing Machine 5)  Atrovent 0.03 % Soln (Ipratropium Bromide) .... One Inhalation Every 6 To 8 Hors As Needed 6)  Albuterol Sulfate (2.5 Mg/61ml) 0.083% Nebu (Albuterol Sulfate) .... One Vial Per Nebulizer Every 6 To 8 Hous Prn 7)  Buspirone Hcl 7.5 Mg Tabs (Buspirone Hcl) .... Take 1 Tablet By Mouth Two Times A Day 8)  Simvastatin 40 Mg Tabs (Simvastatin) .... Take 1 Tab By Mouth At  Bedtime  Allergies (verified): 1)  ! Sulfa  Review of Systems      See HPI Eyes:  Denies discharge, eye pain, and red eye. Heme:  Denies abnormal bruising and bleeding. Allergy:  Denies hives or rash and itching eyes.  Physical Exam  General:  Well-developed,morbidly obese,in no acute distress; alert,appropriate and cooperative throughout examination. Mentally retarded HEENT: No facial asymmetry,  EOMI, No sinus tenderness, TM's Clear, oropharynx  pink and moist.   Chest: clear CVS: S1, S2, No murmurs, No S3.   Abd: Soft, Nontender.  MS: decreased  ROM spine, hips,  and knees.  Ext: No edema.   CNS: CN 2-12 intact, power tone and sensation normal throughout.   Skin: Intact,  Psych: Good eye contact, normal affect.  not anxious or depressed appearing.    Impression & Recommendations:  Problem # 1:  IMPAIRED FASTING GLUCOSE (ICD-790.21) Assessment Deteriorated  His updated medication list for this problem includes:    Metformin Hcl 750 Mg Xr24h-tab (Metformin hcl) .Marland Kitchen... Take 1 tablet by mouth two times a day  Orders: T- Hemoglobin A1C ([id number])  Problem # 2:  GENERALIZED ANXIETY DISORDER (ICD-300.02) Assessment: Improved  His updated medication list for this problem includes:    Buspirone Hcl 7.5 Mg Tabs (Buspirone hcl) .Marland Kitchen... Take 1 tablet by mouth two times a day  Problem # 3:  HYPERLIPIDEMIA (ICD-272.4) Assessment: Comment Only  His updated medication  list for this problem includes:    Simvastatin 40 Mg Tabs (Simvastatin) .Marland Kitchen... Take 1 tab by mouth at bedtime Low fat dietdiscussed and encouraged  Labs Reviewed: SGOT: 15 (12/08/2009)   SGPT: 13 (12/08/2009)   HDL:45 (12/08/2009), 48 (07/23/2009)  LDL:82 (12/08/2009), 134 (16/12/9602)  Chol:140 (12/08/2009), 193 (07/23/2009)  Trig:64 (12/08/2009), 57 (07/23/2009)  Problem # 4:  HYPERTENSION (ICD-401.9) Assessment: Unchanged  His updated medication list for this problem includes:    Hydrochlorothiazide 25 Mg  Tabs (Hydrochlorothiazide) ..... One tab by mouth qd  Orders: T-Basic Metabolic Panel [id number])  BP today: 102/64 Prior BP: 118/82 (12/08/2009)  Labs Reviewed: K+: 4.1 (12/08/2009) Creat: : 1.06 (12/08/2009)   Chol: 140 (12/08/2009)   HDL: 45 (12/08/2009)   LDL: 82 (12/08/2009)   TG: 64 (12/08/2009)  Complete Medication List: 1)  Hydrochlorothiazide 25 Mg Tabs (Hydrochlorothiazide) .... One tab by mouth qd 2)  Klor-con M20 20 Meq Cr-tabs (Potassium chloride crys cr) .... One tab by mouth qd 3)  Loratadine 10 Mg Tabs (Loratadine) .... One tab by mouth qd 4)  Nebulizing Machine  5)  Atrovent 0.03 % Soln (Ipratropium bromide) .... One inhalation every 6 to 8 hors as needed 6)  Albuterol Sulfate (2.5 Mg/92ml) 0.083% Nebu (Albuterol sulfate) .... One vial per nebulizer every 6 to 8 hous prn 7)  Buspirone Hcl 7.5 Mg Tabs (Buspirone hcl) .... Take 1 tablet by mouth two times a day 8)  Simvastatin 40 Mg Tabs (Simvastatin) .... Take 1 tab by mouth at bedtime 9)  Metformin Hcl 750 Mg Xr24h-tab (Metformin hcl) .... Take 1 tablet by mouth two times a day  Other Orders: T-TSH ([id number])  Patient Instructions: 1)  Please schedule a follow-up appointment in 3.5 months. 2)  It is important that you exercise regularly at least 20 minutes 5 times a week. If you develop chest pain, have severe difficulty breathing, or feel very tired , stop exercising immediately and seek medical attention. 3)  You need to lose weight. Consider a lower calorie diet and regular exercise.  4)  BMP prior to visit, ICD-9: 5)  HbgA1C prior to visit, ICD-9:   today. 6)  TSH prior to visit, ICD-9: 7)  No med changes today Prescriptions: METFORMIN HCL 750 MG XR24H-TAB (METFORMIN HCL) Take 1 tablet by mouth two times a day  #60 x 3   Entered and Authorized by:   Syliva Overman MD   Signed by:   Syliva Overman MD on 04/30/2010   Method used:   Historical   RxID:   [id number] BUSPIRONE HCL 7.5 MG TABS  (BUSPIRONE HCL) Take 1 tablet by mouth two times a day  #60 x 3   Entered by:   Adella Hare LPN   Authorized by:   Syliva Overman MD   Signed by:   Adella Hare LPN on 32/44/0102   Method used:   Electronically to        [facility]* (retail)       [address]       [place], [place]  [address]       Ph: [phone]       Fax: [phone]   RxID:   [id number] KLOR-CON M20 20 MEQ CR-TABS (POTASSIUM CHLORIDE CRYS CR) one tab by mouth qd  #30 x 3   Entered by:   Adella Hare LPN   Authorized by:   Syliva Overman MD   Signed by:   Marijean Niemann  Boothe LPN on 16/12/9602   Method used:   Electronically to        [facility]* (retail)       [address]       [place], [place]  [address]       Ph: [phone]       Fax: [phone]   RxID:   [id number] HYDROCHLOROTHIAZIDE 25 MG TABS (HYDROCHLOROTHIAZIDE) one tab by mouth qd  #30 x 3   Entered by:   Adella Hare LPN   Authorized by:   Syliva Overman MD   Signed by:   Adella Hare LPN on 12/31/2534   Method used:   Electronically to        [facility]* (retail)       [address]       [place], [place]  [address]       Ph: [phone]       Fax: [phone]   RxID:   [id number]    Orders Added: 1)  Est. Patient Level IV [[address]] 2)  T-Basic Metabolic Panel [id number] 3)  T- Hemoglobin A1C [[id number]] 4)  T-TSH [[id number]]

## 2010-05-06 ENCOUNTER — Encounter: Payer: Self-pay | Admitting: Family Medicine

## 2010-05-12 NOTE — Letter (Signed)
Summary: OUTPATIENT NUTRITIONAL CARE  OUTPATIENT NUTRITIONAL CARE   Imported By: Lind Guest 05/03/2010 16:33:46  _____________________________________________________________________  External Attachment:    Type:   Image     Comment:   External Document

## 2010-05-12 NOTE — Letter (Signed)
Summary: lab add on  lab add on   Imported By: Luann Bullins 05/06/2010 11:27:48  _____________________________________________________________________  External Attachment:    Type:   Image     Comment:   External Document

## 2010-05-13 ENCOUNTER — Ambulatory Visit: Payer: Medicaid Other

## 2010-05-13 ENCOUNTER — Encounter: Payer: Self-pay | Admitting: Family Medicine

## 2010-05-17 NOTE — Assessment & Plan Note (Signed)
Summary: diabetic teaching  Nurse Visit  Comments patient and mother in today for diabetic teaching, new diabetic information provided along with testing and meter teaching, patient was also given info on diabetic classes at the hospital, mother states they will both go and benefit and she agrees diet modification will be in both of their best interest. mother is also aware patient is started on new med for diabetes.   Allergies: 1)  ! Sulfa Prescriptions: METFORMIN HCL 750 MG XR24H-TAB (METFORMIN HCL) Take 1 tablet by mouth two times a day  #60 x 3   Entered by:   Adella Hare LPN   Authorized by:   Syliva Overman MD   Signed by:   Adella Hare LPN on 91/47/8295   Method used:   Faxed to ...       [facility]* (retail)       [address]       [place], [place]  [address]       Ph: [phone]       Fax: [phone]   RxID:   [id number]   Appended Document: diabetic teaching

## 2010-05-24 NOTE — Letter (Signed)
Summary: nutritional counseling  nutritional counseling   Imported By: Lind Guest 05/17/2010 09:31:29  _____________________________________________________________________  External Attachment:    Type:   Image     Comment:   External Document

## 2010-05-25 ENCOUNTER — Telehealth: Payer: Self-pay | Admitting: Family Medicine

## 2010-05-26 ENCOUNTER — Other Ambulatory Visit: Payer: Self-pay | Admitting: *Deleted

## 2010-05-26 DIAGNOSIS — E119 Type 2 diabetes mellitus without complications: Secondary | ICD-10-CM

## 2010-05-26 MED ORDER — GLUCOSE BLOOD VI STRP: ORAL_STRIP | Status: DC

## 2010-05-26 MED ORDER — ACCU-CHEK MULTICLIX LANCETS MISC: Status: DC

## 2010-05-26 NOTE — Telephone Encounter (Signed)
CALLED BACK AND NEEDED IT SENT TO [facility]

## 2010-06-07 ENCOUNTER — Other Ambulatory Visit: Payer: Self-pay

## 2010-06-07 MED ORDER — SIMVASTATIN 40 MG PO TABS: 40.0000 mg | ORAL_TABLET | Freq: Every day | ORAL | Status: DC

## 2010-06-10 ENCOUNTER — Ambulatory Visit: Payer: Medicaid Other | Admitting: Family Medicine

## 2010-06-15 ENCOUNTER — Other Ambulatory Visit: Payer: Self-pay | Admitting: *Deleted

## 2010-06-15 MED ORDER — HYDROCHLOROTHIAZIDE 25 MG PO TABS: 25.0000 mg | ORAL_TABLET | Freq: Every day | ORAL | Status: DC

## 2010-06-15 MED ORDER — POTASSIUM CHLORIDE CRYS ER 20 MEQ PO TBCR: 20.0000 meq | EXTENDED_RELEASE_TABLET | Freq: Every day | ORAL | Status: DC

## 2010-06-27 ENCOUNTER — Encounter: Payer: Self-pay | Admitting: Family Medicine

## 2010-06-29 ENCOUNTER — Encounter: Payer: Self-pay | Admitting: Family Medicine

## 2010-07-01 ENCOUNTER — Encounter: Payer: Self-pay | Admitting: Family Medicine

## 2010-07-01 ENCOUNTER — Ambulatory Visit (INDEPENDENT_AMBULATORY_CARE_PROVIDER_SITE_OTHER): Payer: Medicaid Other | Admitting: Family Medicine

## 2010-07-01 DIAGNOSIS — E669 Obesity, unspecified: Secondary | ICD-10-CM

## 2010-07-01 DIAGNOSIS — R7309 Other abnormal glucose: Secondary | ICD-10-CM

## 2010-07-01 DIAGNOSIS — R7302 Impaired glucose tolerance (oral): Secondary | ICD-10-CM

## 2010-07-01 DIAGNOSIS — E119 Type 2 diabetes mellitus without complications: Secondary | ICD-10-CM

## 2010-07-01 DIAGNOSIS — Z23 Encounter for immunization: Secondary | ICD-10-CM

## 2010-07-01 DIAGNOSIS — I10 Essential (primary) hypertension: Secondary | ICD-10-CM

## 2010-07-01 DIAGNOSIS — E785 Hyperlipidemia, unspecified: Secondary | ICD-10-CM

## 2010-07-01 MED ORDER — SIMVASTATIN 40 MG PO TABS: 40.0000 mg | ORAL_TABLET | Freq: Every day | ORAL | Status: DC

## 2010-07-01 MED ORDER — POTASSIUM CHLORIDE CRYS ER 20 MEQ PO TBCR: 20.0000 meq | EXTENDED_RELEASE_TABLET | Freq: Every day | ORAL | Status: DC

## 2010-07-01 MED ORDER — HYDROCHLOROTHIAZIDE 25 MG PO TABS: 25.0000 mg | ORAL_TABLET | Freq: Every day | ORAL | Status: DC

## 2010-07-01 NOTE — Patient Instructions (Signed)
Keep appt as before.  Fasting cmp and egfr, lipid and HBa1c  Before next appt.  Microalb today from office  It is important that you exercise regularly at least 30 minutes 5 times a week. If you develop chest pain, have severe difficulty breathing, or feel very tired, stop exercising immediately and seek medical attention   A healthy diet is rich in fruit, vegetables and whole grains. Poultry fish, nuts and beans are a healthy choice for protein rather then red meat. A low sodium diet and drinking 64 ounces of water daily is generally recommended. Oils and sweet should be limited. Carbohydrates especially for those who are diabetic or overweight, should be limited to 34-45 gram per meal. It is important to eat on a regular schedule, at least 3 times daily. Snacks should be primarily fruits, vegetables or nuts.

## 2010-07-01 NOTE — Progress Notes (Signed)
  Subjective:    Patient ID: Jamie Frost, male    DOB: 03/06/1976, 35 y.o.   MRN: [id number]  HPI Pt in due to his new diabetic status , to evaluate level of control and compliance. He is accompanied by his mother who is his caregiver, due to limited mental ability.  HYPERTENSION Disease Monitoring Blood pressure range-120's Chest pain- no      Dyspnea- yes, morbidly obese Medications Compliance- yes Lightheadedness- no   Edema- no   DIABETES Disease Monitoring Blood Sugar ranges-fastings are 70's and 80's Polyuria- no New Visual problems- no Medications Compliance- yes Hypoglycemic symptoms- no   HYPERLIPIDEMIA Disease Monitoring See symptoms for Hypertension Medications Compliance- yes RUQ pain- no  Muscle aches- no  ROS See HPI above   PMH Smoking Status noted   Review of Systems Denies recent fever or chills. Denies sinus pressure, nasal congestion, ear pain or sore throat. Denies chest congestion, productive cough or wheezing.Reports increased wheezing and is requesting an inhaler Denies chest pains, palpitations, paroxysmal nocturnal dyspnea, orthopnea and leg swelling Denies abdominal pain, nausea, vomiting,diarrhea or constipation.  Denies rectal bleeding or change in bowel movement. Denies dysuria, frequency, hesitancy or incontinence. Denies joint pain, swelling and limitation in mobility. Denies headaches, seizure, numbness, or tingling. Denies depression, anxiety or insomnia. Denies skin break down or rash.        Objective:   Physical Exam Patient alert and oriented and in no Cardiopulmonary distress.morbidly obese, with limited mental ability  HEENT: No facial asymmetry, EOMI, no sinus tenderness, TM's clear, Oropharynx pink and moist.  Neck supple no adenopathy.  Chest: Clear to auscultation bilaterally.  CVS: S1, S2 no murmurs, no S3.  ABD: Soft non tender. Bowel sounds normal.  Ext: No edema  MS: Adequate ROM spine, shoulders, hips  and knees.  Skin: Intact, no ulcerations or rash noted.  Psych: Good eye contact, normal affect. Memory intact not anxious or depressed appearing.  CNS: CN 2-12 intact, power, tone and sensation normal throughout.        Assessment & Plan:

## 2010-07-03 ENCOUNTER — Encounter: Payer: Self-pay | Admitting: Family Medicine

## 2010-07-03 DIAGNOSIS — E669 Obesity, unspecified: Secondary | ICD-10-CM | POA: Insufficient documentation

## 2010-07-03 DIAGNOSIS — E1169 Type 2 diabetes mellitus with other specified complication: Secondary | ICD-10-CM | POA: Insufficient documentation

## 2010-07-03 MED ORDER — BENAZEPRIL HCL 5 MG PO TABS: 5.0000 mg | ORAL_TABLET | Freq: Every day | ORAL | Status: DC

## 2010-07-03 NOTE — Assessment & Plan Note (Signed)
Unchanged, reduction in caloric intake and increased physical activity encouraged

## 2010-07-03 NOTE — Assessment & Plan Note (Signed)
Controlled , no med change °

## 2010-07-03 NOTE — Assessment & Plan Note (Signed)
New diagnosis: compliance with medication and testing is good. Dietary counseing done again, espescialy in light of his morbid obesity and young age of diagnosis. Need to add ACE

## 2010-07-03 NOTE — Assessment & Plan Note (Signed)
Uncontrolled. Recheck lipid panel. Low fat diet discussed and encouraged.

## 2010-07-05 LAB — MICROALBUMIN / CREATININE URINE RATIO: Creatinine, Urine: 218.5 mg/dL

## 2010-07-18 ENCOUNTER — Telehealth: Payer: Self-pay | Admitting: Family Medicine

## 2010-07-18 NOTE — Telephone Encounter (Signed)
Thought something was wrong with his kidneys. Advised her that ACEs were used to prevent renal problems in diabetics

## 2010-08-08 ENCOUNTER — Other Ambulatory Visit: Payer: Self-pay

## 2010-08-08 MED ORDER — POTASSIUM CHLORIDE CRYS ER 20 MEQ PO TBCR: EXTENDED_RELEASE_TABLET | ORAL | Status: DC

## 2010-08-08 MED ORDER — BUSPIRONE HCL 7.5 MG PO TABS: 7.5000 mg | ORAL_TABLET | Freq: Two times a day (BID) | ORAL | Status: DC

## 2010-08-08 MED ORDER — METFORMIN HCL ER 750 MG PO TB24: 750.0000 mg | ORAL_TABLET | Freq: Two times a day (BID) | ORAL | Status: DC

## 2010-08-27 LAB — BASIC METABOLIC PANEL WITH GFR
BUN: 10 mg/dL (ref 6–23)
CO2: 26 mEq/L (ref 19–32)
Chloride: 100 mEq/L (ref 96–112)
Glucose, Bld: 87 mg/dL (ref 70–99)
Potassium: 4 mEq/L (ref 3.5–5.3)
Sodium: 138 mEq/L (ref 135–145)

## 2010-08-27 LAB — LIPID PANEL
HDL: 47 mg/dL (ref >39)
LDL Cholesterol: 81 mg/dL (ref 0–99)
Total CHOL/HDL Ratio: 3 Ratio
Triglycerides: 72 mg/dL (ref <150)
VLDL: 14 mg/dL (ref 0–40)

## 2010-08-27 LAB — HEMOGLOBIN A1C: Hgb A1c MFr Bld: 6 % — ABNORMAL HIGH (ref <5.7)

## 2010-09-01 ENCOUNTER — Encounter: Payer: Self-pay | Admitting: Family Medicine

## 2010-09-02 ENCOUNTER — Ambulatory Visit (INDEPENDENT_AMBULATORY_CARE_PROVIDER_SITE_OTHER): Payer: Medicaid Other | Admitting: Family Medicine

## 2010-09-02 VITALS — BP 120/90 | HR 100 | Resp 16 | Ht 66.75 in | Wt 354.1 lb

## 2010-09-02 DIAGNOSIS — R5383 Other fatigue: Secondary | ICD-10-CM

## 2010-09-02 DIAGNOSIS — R5381 Other malaise: Secondary | ICD-10-CM

## 2010-09-02 DIAGNOSIS — E119 Type 2 diabetes mellitus without complications: Secondary | ICD-10-CM

## 2010-09-02 DIAGNOSIS — R946 Abnormal results of thyroid function studies: Secondary | ICD-10-CM

## 2010-09-02 DIAGNOSIS — Q333 Agenesis of lung: Secondary | ICD-10-CM

## 2010-09-02 DIAGNOSIS — E669 Obesity, unspecified: Secondary | ICD-10-CM

## 2010-09-02 DIAGNOSIS — I10 Essential (primary) hypertension: Secondary | ICD-10-CM

## 2010-09-02 DIAGNOSIS — Q336 Congenital hypoplasia and dysplasia of lung: Secondary | ICD-10-CM

## 2010-09-02 DIAGNOSIS — E785 Hyperlipidemia, unspecified: Secondary | ICD-10-CM

## 2010-09-02 MED ORDER — ALBUTEROL SULFATE HFA 108 (90 BASE) MCG/ACT IN AERS: 2.0000 | INHALATION_SPRAY | Freq: Four times a day (QID) | RESPIRATORY_TRACT | Status: DC | PRN

## 2010-09-02 MED ORDER — POTASSIUM CHLORIDE CRYS ER 20 MEQ PO TBCR: EXTENDED_RELEASE_TABLET | ORAL | Status: DC

## 2010-09-02 MED ORDER — METFORMIN HCL ER 750 MG PO TB24: 750.0000 mg | ORAL_TABLET | Freq: Two times a day (BID) | ORAL | Status: DC

## 2010-09-02 MED ORDER — LORATADINE 10 MG PO TABS: 10.0000 mg | ORAL_TABLET | Freq: Every day | ORAL | Status: DC

## 2010-09-02 MED ORDER — BUSPIRONE HCL 7.5 MG PO TABS: 7.5000 mg | ORAL_TABLET | Freq: Two times a day (BID) | ORAL | Status: DC

## 2010-09-02 NOTE — Patient Instructions (Addendum)
F/u in 4 months.  CONGRATS, your blood sugar is GREAT, and you  have lost 12 pounds, keep it up.  New med for wheezing , use  Only if needed Non fasting labs in 4 months, chem 7, hBA1c, tSH , cbc

## 2010-09-03 ENCOUNTER — Encounter: Payer: Self-pay | Admitting: Family Medicine

## 2010-09-03 NOTE — Assessment & Plan Note (Signed)
Controlled, no change in medication  

## 2010-09-03 NOTE — Assessment & Plan Note (Signed)
Improved, continue current medication, pt applauded on his success

## 2010-09-03 NOTE — Assessment & Plan Note (Signed)
Improved. Pt applauded on succesful weight loss through lifestyle change, and encouraged to continue same. Weight loss goal set for the next several months.  

## 2010-09-03 NOTE — Assessment & Plan Note (Signed)
Improved and controlled, encouraged to follow a low fat diet

## 2010-09-03 NOTE — Progress Notes (Signed)
  Subjective:    Patient ID: Jamie Frost, male    DOB: [birth year], 35 y.o.   MRN: [id number]  HPI HYPERTENSION Disease Monitoring Blood pressure range-unknown Chest pain- no      Dyspnea- no Medications Compliance- good Lightheadedness- no   Edema- no   DIABETES Disease Monitoring Blood Sugar ranges-fasting between 100 to 110 Polyuria- no New Visual problems- no Medications Compliance- good Hypoglycemic symptoms- no   HYPERLIPIDEMIA Disease Monitoring See symptoms for Hypertension Medications Compliance- good RUQ pain- no  Muscle aches- no    Review of Systems Denies recent fever or chills. Denies sinus pressure, nasal congestion, ear pain or sore throat. Denies chest congestion, productive cough or wheezing. Denies chest pains, palpitations, paroxysmal nocturnal dyspnea, orthopnea and leg swelling Denies abdominal pain, nausea, vomiting,diarrhea or constipation.  Denies rectal bleeding or change in bowel movement. Denies dysuria, frequency, hesitancy or incontinence. Denies joint pain, swelling and limitation in mobility. Denies headaches, seizure, numbness, or tingling. Denies depression, anxiety or insomnia. Denies skin break down or rash.        Objective:   Physical Exam Patient alert and oriented and in no Cardiopulmonary distress.  HEENT: No facial asymmetry, EOMI, no sinus tenderness, TM's clear, Oropharynx pink and moist.  Neck supple no adenopathy.  Chest: Clear to auscultation bilaterally.  CVS: S1, S2 no murmurs, no S3.  ABD: Soft non tender. Bowel sounds normal.  Ext: No edema  MS: Adequate ROM spine, shoulders, hips and knees.  Skin: Intact, no ulcerations or rash noted.  Psych: Good eye contact, normal affect. Memory intact not anxious or depressed appearing.  CNS: CN 2-12 intact, power, tone and sensation normal throughout.        Assessment & Plan:

## 2010-10-05 ENCOUNTER — Other Ambulatory Visit: Payer: Self-pay | Admitting: *Deleted

## 2010-10-05 MED ORDER — HYDROCHLOROTHIAZIDE 25 MG PO TABS: 25.0000 mg | ORAL_TABLET | Freq: Every day | ORAL | Status: DC

## 2010-10-05 MED ORDER — SIMVASTATIN 40 MG PO TABS: 40.0000 mg | ORAL_TABLET | Freq: Every day | ORAL | Status: DC

## 2010-10-05 MED ORDER — ERGOCALCIFEROL 1.25 MG (50000 UT) PO CAPS: 50000.0000 [IU] | ORAL_CAPSULE | ORAL | Status: DC

## 2010-10-19 ENCOUNTER — Other Ambulatory Visit: Payer: Self-pay

## 2010-10-19 MED ORDER — ERGOCALCIFEROL 1.25 MG (50000 UT) PO CAPS: 50000.0000 [IU] | ORAL_CAPSULE | ORAL | Status: DC

## 2010-10-19 MED ORDER — SIMVASTATIN 40 MG PO TABS: 40.0000 mg | ORAL_TABLET | Freq: Every day | ORAL | Status: DC

## 2010-10-19 MED ORDER — HYDROCHLOROTHIAZIDE 25 MG PO TABS: 25.0000 mg | ORAL_TABLET | Freq: Every day | ORAL | Status: DC

## 2010-10-19 MED ORDER — LORATADINE 10 MG PO TABS: 10.0000 mg | ORAL_TABLET | Freq: Every day | ORAL | Status: DC

## 2010-11-18 ENCOUNTER — Other Ambulatory Visit: Payer: Self-pay

## 2010-11-18 MED ORDER — HYDROCHLOROTHIAZIDE 25 MG PO TABS: 25.0000 mg | ORAL_TABLET | Freq: Every day | ORAL | Status: DC

## 2010-11-18 MED ORDER — SIMVASTATIN 40 MG PO TABS: 40.0000 mg | ORAL_TABLET | Freq: Every day | ORAL | Status: DC

## 2010-12-28 ENCOUNTER — Encounter: Payer: Self-pay | Admitting: Family Medicine

## 2010-12-30 ENCOUNTER — Ambulatory Visit (INDEPENDENT_AMBULATORY_CARE_PROVIDER_SITE_OTHER): Payer: Medicaid Other | Admitting: Family Medicine

## 2010-12-30 VITALS — BP 120/80 | HR 88 | Resp 16 | Ht 66.75 in | Wt 349.8 lb

## 2010-12-30 DIAGNOSIS — I10 Essential (primary) hypertension: Secondary | ICD-10-CM

## 2010-12-30 DIAGNOSIS — F411 Generalized anxiety disorder: Secondary | ICD-10-CM

## 2010-12-30 DIAGNOSIS — Z23 Encounter for immunization: Secondary | ICD-10-CM

## 2010-12-30 DIAGNOSIS — E1169 Type 2 diabetes mellitus with other specified complication: Secondary | ICD-10-CM

## 2010-12-30 DIAGNOSIS — M949 Disorder of cartilage, unspecified: Secondary | ICD-10-CM

## 2010-12-30 DIAGNOSIS — E669 Obesity, unspecified: Secondary | ICD-10-CM

## 2010-12-30 DIAGNOSIS — E785 Hyperlipidemia, unspecified: Secondary | ICD-10-CM

## 2010-12-30 DIAGNOSIS — R5381 Other malaise: Secondary | ICD-10-CM

## 2010-12-30 DIAGNOSIS — E119 Type 2 diabetes mellitus without complications: Secondary | ICD-10-CM

## 2010-12-30 NOTE — Patient Instructions (Signed)
F/U in 4 month  Fasting lipid, cmp and EGFR , HBA1C, Vit D and TSH in 4 month  Congrats on weight loss  Keep it up  TdAP and flu vaccines today

## 2010-12-31 LAB — CBC WITH DIFFERENTIAL/PLATELET
Basophils Absolute: 0 10*3/uL (ref 0.0–0.1)
Lymphocytes Relative: 25 % (ref 12–46)
Lymphs Abs: 2.7 10*3/uL (ref 0.7–4.0)
Neutrophils Relative %: 67 % (ref 43–77)
Platelets: 362 10*3/uL (ref 150–400)
RBC: 4.71 MIL/uL (ref 4.22–5.81)
RDW: 16 % — ABNORMAL HIGH (ref 11.5–15.5)
WBC: 11.2 10*3/uL — ABNORMAL HIGH (ref 4.0–10.5)

## 2010-12-31 LAB — HEMOGLOBIN A1C
Hgb A1c MFr Bld: 6.1 % — ABNORMAL HIGH (ref <5.7)
Mean Plasma Glucose: 128 mg/dL — ABNORMAL HIGH (ref <117)

## 2010-12-31 LAB — TSH: TSH: 3.093 u[IU]/mL (ref 0.350–4.500)

## 2010-12-31 LAB — BASIC METABOLIC PANEL
Calcium: 9.4 mg/dL (ref 8.4–10.5)
Creat: 0.87 mg/dL (ref 0.50–1.35)

## 2011-01-01 ENCOUNTER — Encounter: Payer: Self-pay | Admitting: Family Medicine

## 2011-01-01 NOTE — Assessment & Plan Note (Signed)
Improved. Pt applauded on succesful weight loss through lifestyle change, and encouraged to continue same. Weight loss goal set for the next several months.  

## 2011-01-01 NOTE — Assessment & Plan Note (Signed)
Controlled, no change in medication  

## 2011-01-01 NOTE — Progress Notes (Signed)
  Subjective:    Patient ID: Jamie Frost, male    DOB: [birth year], 35 y.o.   MRN: [id number]  HPI The PT is here for follow up and re-evaluation of chronic medical conditions, medication management and review of any available recent lab and radiology data.  Preventive health is updated, specifically  Cancer screening and Immunization.   The PT denies any adverse reactions to current medications since the last visit.  There are no new concerns.  There are no specific complaints. Pt has continued to work on weight loss through dietary change with great success      Review of Systems See HPI Denies recent fever or chills. Denies sinus pressure, nasal congestion, ear pain or sore throat. Denies chest congestion, productive cough or wheezing. Denies chest pains, palpitations and leg swelling Denies abdominal pain, nausea, vomiting,diarrhea or constipation.   Denies dysuria, frequency, hesitancy or incontinence. Denies joint pain, swelling and limitation in mobility. Denies headaches, seizures, numbness, or tingling. Denies depression, anxiety or insomnia. Denies skin break down or rash.        Objective:   Physical Exam Patient alert and oriented and in no cardiopulmonary distress.mildly mentally retarded  HEENT: No facial asymmetry, EOMI, no sinus tenderness,  oropharynx pink and moist.  Neck supple no adenopathy.  Chest: Clear to auscultation bilaterally.Decreased air entry  CVS: S1, S2 no murmurs, no S3.  ABD: Soft non tender. Bowel sounds normal.  Ext: No edema  MS: Adequate ROM spine, shoulders, hips and knees.  Skin: Intact, no ulcerations or rash noted.  Psych: Good eye contact, normal affect. Memory intact not anxious or depressed appearing.  CNS: CN 2-12 intact, power, tone and sensation normal throughout.        Assessment & Plan:

## 2011-01-01 NOTE — Assessment & Plan Note (Signed)
Controlled, no change in medication Hyperlipidemia:Low fat diet discussed and encouraged.  \ 

## 2011-01-18 ENCOUNTER — Other Ambulatory Visit: Payer: Self-pay

## 2011-01-18 MED ORDER — LORATADINE 10 MG PO TABS: 10.0000 mg | ORAL_TABLET | Freq: Every day | ORAL | Status: DC

## 2011-02-08 ENCOUNTER — Other Ambulatory Visit: Payer: Self-pay

## 2011-02-08 MED ORDER — SIMVASTATIN 40 MG PO TABS: 40.0000 mg | ORAL_TABLET | Freq: Every day | ORAL | Status: DC

## 2011-02-08 MED ORDER — LORATADINE 10 MG PO TABS: 10.0000 mg | ORAL_TABLET | Freq: Every day | ORAL | Status: DC

## 2011-02-08 MED ORDER — HYDROCHLOROTHIAZIDE 25 MG PO TABS: 25.0000 mg | ORAL_TABLET | Freq: Every day | ORAL | Status: DC

## 2011-02-10 ENCOUNTER — Other Ambulatory Visit: Payer: Self-pay

## 2011-02-10 MED ORDER — ERGOCALCIFEROL 1.25 MG (50000 UT) PO CAPS: 50000.0000 [IU] | ORAL_CAPSULE | ORAL | Status: DC

## 2011-04-12 ENCOUNTER — Other Ambulatory Visit: Payer: Self-pay

## 2011-04-12 MED ORDER — POTASSIUM CHLORIDE CRYS ER 20 MEQ PO TBCR: EXTENDED_RELEASE_TABLET | ORAL | Status: DC

## 2011-04-12 MED ORDER — BUSPIRONE HCL 7.5 MG PO TABS: 7.5000 mg | ORAL_TABLET | Freq: Two times a day (BID) | ORAL | Status: DC

## 2011-04-12 MED ORDER — METFORMIN HCL ER 750 MG PO TB24: 750.0000 mg | ORAL_TABLET | Freq: Two times a day (BID) | ORAL | Status: DC

## 2011-05-05 ENCOUNTER — Ambulatory Visit: Payer: Medicaid Other | Admitting: Family Medicine

## 2011-05-18 LAB — COMPLETE METABOLIC PANEL WITH GFR
ALT: 15 U/L (ref 0–53)
Albumin: 4.2 g/dL (ref 3.5–5.2)
Alkaline Phosphatase: 74 U/L (ref 39–117)
CO2: 21 mEq/L (ref 19–32)
GFR, Est African American: >89 mL/min (ref >90)
GFR, Est Non African American: >89 mL/min (ref >90)
Glucose, Bld: 89 mg/dL (ref 70–99)
Potassium: 4.2 mEq/L (ref 3.5–5.3)
Sodium: 134 mEq/L — ABNORMAL LOW (ref 135–145)
Total Bilirubin: 0.4 mg/dL (ref 0.3–1.2)
Total Protein: 7.6 g/dL (ref 6.0–8.3)

## 2011-05-18 LAB — LIPID PANEL
HDL: 51 mg/dL (ref >39)
LDL Cholesterol: 87 mg/dL (ref 0–99)

## 2011-05-18 LAB — HEMOGLOBIN A1C: Mean Plasma Glucose: 128 mg/dL — ABNORMAL HIGH (ref <117)

## 2011-05-19 ENCOUNTER — Ambulatory Visit (INDEPENDENT_AMBULATORY_CARE_PROVIDER_SITE_OTHER): Payer: Medicaid Other | Admitting: Family Medicine

## 2011-05-19 ENCOUNTER — Encounter: Payer: Self-pay | Admitting: Family Medicine

## 2011-05-19 VITALS — BP 120/82 | HR 79 | Resp 16 | Ht 66.75 in | Wt 341.0 lb

## 2011-05-19 DIAGNOSIS — R6889 Other general symptoms and signs: Secondary | ICD-10-CM

## 2011-05-19 DIAGNOSIS — E785 Hyperlipidemia, unspecified: Secondary | ICD-10-CM

## 2011-05-19 DIAGNOSIS — I10 Essential (primary) hypertension: Secondary | ICD-10-CM

## 2011-05-19 DIAGNOSIS — E1169 Type 2 diabetes mellitus with other specified complication: Secondary | ICD-10-CM

## 2011-05-19 DIAGNOSIS — E119 Type 2 diabetes mellitus without complications: Secondary | ICD-10-CM

## 2011-05-19 DIAGNOSIS — R7989 Other specified abnormal findings of blood chemistry: Secondary | ICD-10-CM | POA: Insufficient documentation

## 2011-05-19 DIAGNOSIS — E669 Obesity, unspecified: Secondary | ICD-10-CM

## 2011-05-19 DIAGNOSIS — F411 Generalized anxiety disorder: Secondary | ICD-10-CM

## 2011-05-19 MED ORDER — LORATADINE 10 MG PO TABS: 10.0000 mg | ORAL_TABLET | Freq: Every day | ORAL | Status: DC

## 2011-05-19 MED ORDER — HYDROCHLOROTHIAZIDE 25 MG PO TABS: 25.0000 mg | ORAL_TABLET | Freq: Every day | ORAL | Status: DC

## 2011-05-19 MED ORDER — ERGOCALCIFEROL 1.25 MG (50000 UT) PO CAPS: 50000.0000 [IU] | ORAL_CAPSULE | ORAL | Status: DC

## 2011-05-19 MED ORDER — SIMVASTATIN 40 MG PO TABS: 40.0000 mg | ORAL_TABLET | Freq: Every day | ORAL | Status: DC

## 2011-05-19 MED ORDER — BENAZEPRIL HCL 5 MG PO TABS: 5.0000 mg | ORAL_TABLET | Freq: Every day | ORAL | Status: DC

## 2011-05-19 NOTE — Assessment & Plan Note (Signed)
Improved. Pt applauded on succesful weight loss through lifestyle change, and encouraged to continue same. Weight loss goal set for the next several months.  

## 2011-05-19 NOTE — Assessment & Plan Note (Signed)
Controlled, no change in medication  

## 2011-05-19 NOTE — Progress Notes (Signed)
  Subjective:    Patient ID: Jamie Frost, male    DOB: 05-19-1975, 36 y.o.   MRN: [id number]  HPI The PT is here for follow up and re-evaluation of chronic medical conditions, medication management and review of any available recent lab and radiology data.  Preventive health is updated, specifically  Cancer screening and Immunization.   Questions or concerns regarding consultations or procedures which the PT has had in the interim are  addressed. The PT denies any adverse reactions to current medications since the last visit.  There are no new concerns.  There are no specific complaints       Review of Systems See HPI Denies recent fever or chills. Denies sinus pressure, nasal congestion, ear pain or sore throat. Denies chest congestion, productive cough or wheezing. Denies chest pains, palpitations and leg swelling Denies abdominal pain, nausea, vomiting,diarrhea or constipation.   Denies dysuria, frequency, hesitancy or incontinence. Denies joint pain, swelling and limitation in mobility. Denies headaches, seizures, numbness, or tingling. Denies depression, anxiety or insomnia. Denies skin break down or rash.        Objective:   Physical Exam   Patient alert and oriented and in no cardiopulmonary distress.  HEENT: No facial asymmetry, EOMI, no sinus tenderness,  oropharynx pink and moist.  Neck supple no adenopathy.  Chest: Clear to auscultation bilaterally.  CVS: S1, S2 no murmurs, no S3.  ABD: Soft non tender. Bowel sounds normal.  Ext: No edema  MS: Adequate ROM spine, shoulders, hips and knees.  Skin: Intact, no ulcerations or rash noted.  Psych: Good eye contact,. Memory impaired, pt has mild mental retardation, not anxious or depressed appearing.  CNS: CN 2-12 intact, power, tone and sensation normal throughout.      Assessment & Plan:

## 2011-05-19 NOTE — Patient Instructions (Signed)
F/u in 4 month.  Excellent labs and 30 pound weight loss in 1 year. CONGRATULATIONS!!!!  You are referred for an ultrasound of your thyroid gland.  No new medication.  It is important that you exercise regularly at least 30 minutes 5 times a week. If you develop chest pain, have severe difficulty breathing, or feel very tired, stop exercising immediately and seek medical attention   A healthy diet is rich in fruit, vegetables and whole grains. Poultry fish, nuts and beans are a healthy choice for protein rather then red meat. A low sodium diet and drinking 64 ounces of water daily is generally recommended. Oils and sweet should be limited. Carbohydrates especially for those who are diabetic or overweight, should be limited to 30-45 gram per meal. It is important to eat on a regular schedule, at least 3 times daily. Snacks should be primarily fruits, vegetables or nuts.   HBA1c, tSH and microalb in 4 months

## 2011-05-20 NOTE — Assessment & Plan Note (Signed)
Controlled, no change in medication  

## 2011-05-20 NOTE — Assessment & Plan Note (Addendum)
abnormal tSh, refer for [place], may need low dose of medication to supplement gland if hypofunction is repeated in next test

## 2011-06-05 ENCOUNTER — Ambulatory Visit (HOSPITAL_COMMUNITY): Payer: Medicaid Other

## 2011-09-15 ENCOUNTER — Telehealth: Payer: Self-pay | Admitting: Family Medicine

## 2011-09-18 ENCOUNTER — Telehealth: Payer: Self-pay

## 2011-09-18 NOTE — Telephone Encounter (Signed)
States Jamie Frost had [place] ordered back in March to be done but her brother died and she was unable to take him. She said she needs it scheduled for June 24 if possible.

## 2011-09-18 NOTE — Telephone Encounter (Signed)
pls trty and re order thyroid US for 24th if possible based on Mom's message, let her know

## 2011-09-19 NOTE — Telephone Encounter (Signed)
Luann to try and schedule it again for the 24th of July

## 2011-09-22 ENCOUNTER — Ambulatory Visit: Payer: Medicaid Other | Admitting: Family Medicine

## 2011-09-26 ENCOUNTER — Other Ambulatory Visit: Payer: Self-pay | Admitting: *Deleted

## 2011-09-26 MED ORDER — POTASSIUM CHLORIDE CRYS ER 20 MEQ PO TBCR: EXTENDED_RELEASE_TABLET | ORAL | Status: DC

## 2011-09-26 MED ORDER — METFORMIN HCL ER 750 MG PO TB24: 750.0000 mg | ORAL_TABLET | Freq: Two times a day (BID) | ORAL | Status: DC

## 2011-09-26 MED ORDER — BUSPIRONE HCL 7.5 MG PO TABS: 7.5000 mg | ORAL_TABLET | Freq: Two times a day (BID) | ORAL | Status: DC

## 2011-09-27 ENCOUNTER — Other Ambulatory Visit: Payer: Self-pay | Admitting: Family Medicine

## 2011-09-28 ENCOUNTER — Ambulatory Visit (INDEPENDENT_AMBULATORY_CARE_PROVIDER_SITE_OTHER): Payer: Medicaid Other | Admitting: Family Medicine

## 2011-09-28 ENCOUNTER — Encounter: Payer: Self-pay | Admitting: Family Medicine

## 2011-09-28 VITALS — BP 126/74 | HR 100 | Resp 20 | Ht 66.75 in | Wt 340.0 lb

## 2011-09-28 DIAGNOSIS — R7989 Other specified abnormal findings of blood chemistry: Secondary | ICD-10-CM

## 2011-09-28 DIAGNOSIS — E785 Hyperlipidemia, unspecified: Secondary | ICD-10-CM

## 2011-09-28 DIAGNOSIS — E669 Obesity, unspecified: Secondary | ICD-10-CM

## 2011-09-28 DIAGNOSIS — E119 Type 2 diabetes mellitus without complications: Secondary | ICD-10-CM

## 2011-09-28 DIAGNOSIS — I10 Essential (primary) hypertension: Secondary | ICD-10-CM

## 2011-09-28 DIAGNOSIS — E1169 Type 2 diabetes mellitus with other specified complication: Secondary | ICD-10-CM

## 2011-09-28 DIAGNOSIS — F411 Generalized anxiety disorder: Secondary | ICD-10-CM

## 2011-09-28 DIAGNOSIS — R6889 Other general symptoms and signs: Secondary | ICD-10-CM

## 2011-09-28 LAB — TSH: TSH: 1.85 u[IU]/mL (ref 0.350–4.500)

## 2011-09-28 LAB — HEMOGLOBIN A1C: Hgb A1c MFr Bld: 6.3 % — ABNORMAL HIGH (ref <5.7)

## 2011-09-28 NOTE — Patient Instructions (Addendum)
F/u in 4.5 month.Call if you need me before.  Call for flu vaccine in october  You are to get new date for thyroid ultrasound on the way out  Fasting lipid, cmp, HBA1C  3 to 5 days before next visit  Weight loss goal of 2 pounds per month  It is important that you exercise regularly at least 30 minutes 5 times a week. If you develop chest pain, have severe difficulty breathing, or feel very tired, stop exercising immediately and seek medical attention   A healthy diet is rich in fruit, vegetables and whole grains. Poultry fish, nuts and beans are a healthy choice for protein rather then red meat. A low sodium diet and drinking 64 ounces of water daily is generally recommended. Oils and sweet should be limited. Carbohydrates especially for those who are diabetic or overweight, should be limited to 30-45 gram per meal. It is important to eat on a regular schedule, at least 3 times daily. Snacks should be primarily fruits, vegetables or nuts.

## 2011-09-30 NOTE — Assessment & Plan Note (Signed)
Hyperlipidemia:Low fat diet discussed and encouraged.  Controlled, no change in medication   

## 2011-09-30 NOTE — Assessment & Plan Note (Signed)
unchanged Patient re-educated about  the importance of commitment to a  minimum of 150 minutes of exercise per week. The importance of healthy food choices with portion control discussed. Encouraged to start a food diary, count calories and to consider  joining a support group. Sample diet sheets offered. Goals set by the patient for the next several months.    

## 2011-09-30 NOTE — Assessment & Plan Note (Signed)
Controlled, no change in medication Needs to work more aggresively on weight loss and dietary change

## 2011-09-30 NOTE — Assessment & Plan Note (Signed)
needs thyroid ultrasound to further eval

## 2011-09-30 NOTE — Assessment & Plan Note (Signed)
Controlled, no change in medication  

## 2011-09-30 NOTE — Progress Notes (Signed)
  Subjective:    Patient ID: Jamie Frost, male    DOB: 09/28/1975, 36 y.o.   MRN: [id number]  HPI The PT is here for follow up and re-evaluation of chronic medical conditions, medication management and review of any available recent lab and radiology data.  Preventive health is updated, specifically   Immunization.    Review of Systems See HPI Denies recent fever or chills. Denies sinus pressure, nasal congestion, ear pain or sore throat. Denies chest congestion, productive cough or wheezing. Denies chest pains, palpitations and leg swelling Denies abdominal pain, nausea, vomiting,diarrhea or constipation.   Denies dysuria, frequency, hesitancy or incontinence. Denies joint pain, swelling and limitation in mobility. Denies headaches, seizures, numbness, or tingling. Denies depression, anxiety or insomnia. Denies skin break down or rash.        Objective:   Physical Exam Patient alert and oriented and in no cardiopulmonary distress.  HEENT: No facial asymmetry, EOMI, no sinus tenderness,  oropharynx pink and moist.  Neck supple no adenopathy.  Chest: Clear to auscultation bilaterally.  CVS: S1, S2 no murmurs, no S3.  ABD: Soft non tender. Bowel sounds normal.  Ext: No edema  MS: Adequate ROM spine, shoulders, hips and knees.  Skin: Intact, no ulcerations or rash noted.  Psych: Good eye contact, moderate mental retardation. Memory intacimpaired not anxious or depressed appearing.  CNS: CN 2-12 intact, power, tone and sensation normal throughout.        Assessment & Plan:

## 2011-10-10 ENCOUNTER — Telehealth: Payer: Self-pay | Admitting: Family Medicine

## 2011-10-13 NOTE — Telephone Encounter (Signed)
Mother is aware. 

## 2011-10-24 ENCOUNTER — Other Ambulatory Visit: Payer: Self-pay

## 2011-10-24 DIAGNOSIS — E669 Obesity, unspecified: Secondary | ICD-10-CM

## 2011-10-24 MED ORDER — BUSPIRONE HCL 7.5 MG PO TABS: 7.5000 mg | ORAL_TABLET | Freq: Two times a day (BID) | ORAL | Status: DC

## 2011-10-24 MED ORDER — BENAZEPRIL HCL 5 MG PO TABS: 5.0000 mg | ORAL_TABLET | Freq: Every day | ORAL | Status: DC

## 2011-10-24 MED ORDER — SIMVASTATIN 40 MG PO TABS: 40.0000 mg | ORAL_TABLET | Freq: Every day | ORAL | Status: DC

## 2011-10-24 MED ORDER — POTASSIUM CHLORIDE CRYS ER 20 MEQ PO TBCR: EXTENDED_RELEASE_TABLET | ORAL | Status: DC

## 2011-10-24 MED ORDER — METFORMIN HCL ER 750 MG PO TB24: 750.0000 mg | ORAL_TABLET | Freq: Two times a day (BID) | ORAL | Status: DC

## 2011-10-24 MED ORDER — HYDROCHLOROTHIAZIDE 25 MG PO TABS: 25.0000 mg | ORAL_TABLET | Freq: Every day | ORAL | Status: DC

## 2011-10-24 MED ORDER — LORATADINE 10 MG PO TABS: 10.0000 mg | ORAL_TABLET | Freq: Every day | ORAL | Status: DC

## 2011-10-24 MED ORDER — ERGOCALCIFEROL 1.25 MG (50000 UT) PO CAPS: 50000.0000 [IU] | ORAL_CAPSULE | ORAL | Status: DC

## 2011-10-27 ENCOUNTER — Ambulatory Visit (HOSPITAL_COMMUNITY)
Admission: RE | Admit: 2011-10-27 | Discharge: 2011-10-27 | Disposition: A | Discharge status: Home / Self Care | Payer: Medicaid Other | Source: Ambulatory Visit | Attending: Family Medicine | Admitting: Family Medicine

## 2011-10-27 DIAGNOSIS — R7989 Other specified abnormal findings of blood chemistry: Secondary | ICD-10-CM

## 2011-10-27 DIAGNOSIS — E049 Nontoxic goiter, unspecified: Secondary | ICD-10-CM | POA: Insufficient documentation

## 2011-11-29 ENCOUNTER — Ambulatory Visit (INDEPENDENT_AMBULATORY_CARE_PROVIDER_SITE_OTHER): Payer: Medicaid Other

## 2011-11-29 DIAGNOSIS — Z23 Encounter for immunization: Secondary | ICD-10-CM

## 2012-02-12 ENCOUNTER — Ambulatory Visit: Payer: Medicaid Other | Admitting: Family Medicine

## 2012-02-27 ENCOUNTER — Ambulatory Visit: Payer: Medicaid Other | Admitting: Family Medicine

## 2012-03-13 ENCOUNTER — Other Ambulatory Visit: Payer: Self-pay

## 2012-03-13 DIAGNOSIS — E1169 Type 2 diabetes mellitus with other specified complication: Secondary | ICD-10-CM

## 2012-03-13 DIAGNOSIS — E669 Obesity, unspecified: Secondary | ICD-10-CM

## 2012-03-13 MED ORDER — POTASSIUM CHLORIDE CRYS ER 20 MEQ PO TBCR: EXTENDED_RELEASE_TABLET | ORAL | Status: DC

## 2012-03-13 MED ORDER — METFORMIN HCL ER 750 MG PO TB24: 750.0000 mg | ORAL_TABLET | Freq: Two times a day (BID) | ORAL | Status: DC

## 2012-03-13 MED ORDER — SIMVASTATIN 40 MG PO TABS: 40.0000 mg | ORAL_TABLET | Freq: Every day | ORAL | Status: DC

## 2012-03-13 MED ORDER — BUSPIRONE HCL 7.5 MG PO TABS: 7.5000 mg | ORAL_TABLET | Freq: Two times a day (BID) | ORAL | Status: DC

## 2012-03-13 MED ORDER — BENAZEPRIL HCL 5 MG PO TABS: 5.0000 mg | ORAL_TABLET | Freq: Every day | ORAL | Status: DC

## 2012-03-13 MED ORDER — LORATADINE 10 MG PO TABS: 10.0000 mg | ORAL_TABLET | Freq: Every day | ORAL | Status: DC

## 2012-03-13 MED ORDER — HYDROCHLOROTHIAZIDE 25 MG PO TABS: 25.0000 mg | ORAL_TABLET | Freq: Every day | ORAL | Status: DC

## 2012-03-13 MED ORDER — ERGOCALCIFEROL 1.25 MG (50000 UT) PO CAPS: 50000.0000 [IU] | ORAL_CAPSULE | ORAL | Status: DC

## 2012-06-18 ENCOUNTER — Other Ambulatory Visit: Payer: Self-pay

## 2012-06-18 DIAGNOSIS — E669 Obesity, unspecified: Secondary | ICD-10-CM

## 2012-06-18 MED ORDER — METFORMIN HCL ER 750 MG PO TB24: 750.0000 mg | ORAL_TABLET | Freq: Two times a day (BID) | ORAL | Status: DC

## 2012-06-18 MED ORDER — HYDROCHLOROTHIAZIDE 25 MG PO TABS: 25.0000 mg | ORAL_TABLET | Freq: Every day | ORAL | Status: DC

## 2012-06-18 MED ORDER — ERGOCALCIFEROL 1.25 MG (50000 UT) PO CAPS: 50000.0000 [IU] | ORAL_CAPSULE | ORAL | Status: DC

## 2012-06-18 MED ORDER — BENAZEPRIL HCL 5 MG PO TABS: 5.0000 mg | ORAL_TABLET | Freq: Every day | ORAL | Status: DC

## 2012-06-18 MED ORDER — LORATADINE 10 MG PO TABS: 10.0000 mg | ORAL_TABLET | Freq: Every day | ORAL | Status: DC

## 2012-06-18 MED ORDER — SIMVASTATIN 40 MG PO TABS: 40.0000 mg | ORAL_TABLET | Freq: Every day | ORAL | Status: DC

## 2012-06-18 MED ORDER — BUSPIRONE HCL 7.5 MG PO TABS: 7.5000 mg | ORAL_TABLET | Freq: Two times a day (BID) | ORAL | Status: DC

## 2012-06-18 MED ORDER — POTASSIUM CHLORIDE CRYS ER 20 MEQ PO TBCR: EXTENDED_RELEASE_TABLET | ORAL | Status: DC

## 2012-08-01 ENCOUNTER — Encounter: Payer: Self-pay | Admitting: Family Medicine

## 2012-08-01 ENCOUNTER — Ambulatory Visit (INDEPENDENT_AMBULATORY_CARE_PROVIDER_SITE_OTHER): Payer: Medicaid Other | Admitting: Family Medicine

## 2012-08-01 VITALS — BP 130/90 | HR 78 | Resp 18 | Ht 66.75 in | Wt 330.1 lb

## 2012-08-01 DIAGNOSIS — I10 Essential (primary) hypertension: Secondary | ICD-10-CM

## 2012-08-01 DIAGNOSIS — L0390 Cellulitis, unspecified: Secondary | ICD-10-CM | POA: Insufficient documentation

## 2012-08-01 DIAGNOSIS — F411 Generalized anxiety disorder: Secondary | ICD-10-CM

## 2012-08-01 DIAGNOSIS — E669 Obesity, unspecified: Secondary | ICD-10-CM

## 2012-08-01 DIAGNOSIS — L0291 Cutaneous abscess, unspecified: Secondary | ICD-10-CM

## 2012-08-01 DIAGNOSIS — E119 Type 2 diabetes mellitus without complications: Secondary | ICD-10-CM

## 2012-08-01 DIAGNOSIS — E1169 Type 2 diabetes mellitus with other specified complication: Secondary | ICD-10-CM

## 2012-08-01 DIAGNOSIS — E785 Hyperlipidemia, unspecified: Secondary | ICD-10-CM

## 2012-08-01 DIAGNOSIS — J301 Allergic rhinitis due to pollen: Secondary | ICD-10-CM

## 2012-08-01 LAB — COMPREHENSIVE METABOLIC PANEL
AST: 18 U/L (ref 0–37)
Albumin: 4 g/dL (ref 3.5–5.2)
Alkaline Phosphatase: 86 U/L (ref 39–117)
BUN: 15 mg/dL (ref 6–23)
Potassium: 4.5 mEq/L (ref 3.5–5.3)
Sodium: 136 mEq/L (ref 135–145)
Total Bilirubin: 0.4 mg/dL (ref 0.3–1.2)

## 2012-08-01 LAB — LIPID PANEL
HDL: 45 mg/dL (ref >39)
LDL Cholesterol: 77 mg/dL (ref 0–99)
VLDL: 21 mg/dL (ref 0–40)

## 2012-08-01 LAB — HEMOGLOBIN A1C: Hgb A1c MFr Bld: 6 % — ABNORMAL HIGH (ref <5.7)

## 2012-08-01 MED ORDER — DOXYCYCLINE HYCLATE 100 MG PO TBEC: 100.0000 mg | DELAYED_RELEASE_TABLET | Freq: Two times a day (BID) | ORAL | Status: DC

## 2012-08-01 MED ORDER — BENAZEPRIL HCL 10 MG PO TABS: 10.0000 mg | ORAL_TABLET | Freq: Every day | ORAL | Status: DC

## 2012-08-01 NOTE — Patient Instructions (Addendum)
F/u in 4 month  Microalb, chem 7 and eGFR and hBA1C in 4 month, before visit  Doxycycline sent in for sore on the right arm  Dose increase in benazepril to 10mg  one daily for blood pressure. OK to take TWO 5mg  tablets daily, till done  Please work on weight l;oss  You are referred to eye specialist as well as to podiatrist, you do need special shoes

## 2012-08-01 NOTE — Progress Notes (Signed)
  Subjective:    Patient ID: Jamie Frost, male    DOB: 01-02-1976, 37 y.o.   MRN: [id number]  HPI The PT is here for follow up and re-evaluation of chronic medical conditions, medication management and review of any available recent lab and radiology data.  Preventive health is updated, specifically  Cancer screening and Immunization.   Questions or concerns regarding consultations or procedures which the PT has had in the interim are  addressed. The PT denies any adverse reactions to current medications since the last visit.  C/o sore on right inner arm for the past week, red and draining Denies polyuria, polydipsia or hypoglycemic episodes Has worked on reduced intake with weight loss. Requests referral for special shoes which he ahs qualified for in the past due to abnormal foot , with limitation in mobility      Review of Systems See HPI Denies recent fever or chills. Denies sinus pressure, nasal congestion, ear pain or sore throat. Denies chest congestion, productive cough or wheezing. Denies chest pains, palpitations and leg swelling Denies abdominal pain, nausea, vomiting,diarrhea or constipation.   Denies dysuria, frequency, hesitancy or incontinence.  Denies headaches, seizures, numbness, or tingling. Denies depression,uncontrolled  anxiety or insomnia.       Objective:   Physical Exam  Patient alert and oriented and in no cardiopulmonary distress.Moderate mental retardation  HEENT: No facial asymmetry, EOMI, no sinus tenderness,  oropharynx pink and moist.  Neck supple no adenopathy.  Chest: Clear to auscultation bilaterally.  CVS: S1, S2 no murmurs, no S3.  ABD: Soft non tender. Bowel sounds normal.  Ext: No edema  MS: Adequate ROM spine, shoulders, hips and knees.  Skin: Erythematous ulcer on right arm inner aspect, with scant drainage  Psych: Good eye contact, normal affect. not anxious or depressed appearing.  CNS: CN 2-12 intact, power, tone  and sensation normal throughout.       Assessment & Plan:

## 2012-08-03 NOTE — Assessment & Plan Note (Signed)
Controlled, no change in medication  

## 2012-08-03 NOTE — Assessment & Plan Note (Signed)
Improved. Pt applauded on succesful weight loss through lifestyle change, and encouraged to continue same. Weight loss goal set for the next several months.  

## 2012-08-03 NOTE — Assessment & Plan Note (Signed)
Acute infection on right inner arm, antibiotic prescribed

## 2012-08-03 NOTE — Assessment & Plan Note (Signed)
Controlled, no change in medication Patient advised to reduce carb and sweets, commit to regular physical activity, take meds as prescribed, test blood as directed, and attempt to lose weight, to improve blood sugar control.  

## 2012-08-03 NOTE — Assessment & Plan Note (Signed)
Controlled, no change in medication Hyperlipidemia:Low fat diet discussed and encouraged.  \ 

## 2012-08-03 NOTE — Assessment & Plan Note (Signed)
Uncontrolled. Dose increase in medication DASH diet and commitment to daily physical activity for a minimum of 30 minutes discussed and encouraged, as a part of hypertension management. The importance of attaining a healthy weight is also discussed.  

## 2012-08-05 ENCOUNTER — Telehealth: Payer: Self-pay | Admitting: Family Medicine

## 2012-08-06 ENCOUNTER — Telehealth: Payer: Self-pay | Admitting: Family Medicine

## 2012-08-06 NOTE — Telephone Encounter (Signed)
spoke with mother and told her that the office of Dr. Nolen Mu that is in [place] that she would have to go there and make appointment because of the Medicaid and when she did to let me know and I would fax over the paper work for him

## 2012-08-08 NOTE — Telephone Encounter (Signed)
Spoke with mother she is aware of the appointment

## 2012-09-12 ENCOUNTER — Other Ambulatory Visit: Payer: Self-pay

## 2012-12-16 ENCOUNTER — Encounter: Payer: Self-pay | Admitting: Family Medicine

## 2012-12-16 ENCOUNTER — Other Ambulatory Visit: Payer: Self-pay | Admitting: Family Medicine

## 2012-12-16 ENCOUNTER — Ambulatory Visit (INDEPENDENT_AMBULATORY_CARE_PROVIDER_SITE_OTHER): Payer: Medicaid Other | Admitting: Family Medicine

## 2012-12-16 ENCOUNTER — Encounter (INDEPENDENT_AMBULATORY_CARE_PROVIDER_SITE_OTHER): Payer: Self-pay

## 2012-12-16 VITALS — BP 110/78 | HR 87 | Resp 16 | Ht 66.75 in | Wt 332.4 lb

## 2012-12-16 DIAGNOSIS — I10 Essential (primary) hypertension: Secondary | ICD-10-CM

## 2012-12-16 DIAGNOSIS — E669 Obesity, unspecified: Secondary | ICD-10-CM

## 2012-12-16 DIAGNOSIS — E785 Hyperlipidemia, unspecified: Secondary | ICD-10-CM

## 2012-12-16 DIAGNOSIS — F411 Generalized anxiety disorder: Secondary | ICD-10-CM

## 2012-12-16 DIAGNOSIS — E119 Type 2 diabetes mellitus without complications: Secondary | ICD-10-CM

## 2012-12-16 DIAGNOSIS — B369 Superficial mycosis, unspecified: Secondary | ICD-10-CM

## 2012-12-16 DIAGNOSIS — J301 Allergic rhinitis due to pollen: Secondary | ICD-10-CM

## 2012-12-16 DIAGNOSIS — E1169 Type 2 diabetes mellitus with other specified complication: Secondary | ICD-10-CM

## 2012-12-16 DIAGNOSIS — Z23 Encounter for immunization: Secondary | ICD-10-CM

## 2012-12-16 MED ORDER — POTASSIUM CHLORIDE CRYS ER 20 MEQ PO TBCR: 20.0000 meq | EXTENDED_RELEASE_TABLET | Freq: Every day | ORAL | Status: DC

## 2012-12-16 MED ORDER — ERGOCALCIFEROL 1.25 MG (50000 UT) PO CAPS: 50000.0000 [IU] | ORAL_CAPSULE | ORAL | Status: DC

## 2012-12-16 MED ORDER — HYDROCHLOROTHIAZIDE 25 MG PO TABS: 25.0000 mg | ORAL_TABLET | Freq: Every day | ORAL | Status: DC

## 2012-12-16 MED ORDER — METFORMIN HCL ER 750 MG PO TB24: 750.0000 mg | ORAL_TABLET | Freq: Two times a day (BID) | ORAL | Status: DC

## 2012-12-16 MED ORDER — LORATADINE 10 MG PO TABS: 10.0000 mg | ORAL_TABLET | Freq: Every day | ORAL | Status: DC

## 2012-12-16 MED ORDER — CLOTRIMAZOLE-BETAMETHASONE 1-0.05 % EX CREA: TOPICAL_CREAM | Freq: Two times a day (BID) | CUTANEOUS | Status: AC

## 2012-12-16 MED ORDER — SIMVASTATIN 40 MG PO TABS: 40.0000 mg | ORAL_TABLET | Freq: Every day | ORAL | Status: DC

## 2012-12-16 MED ORDER — BUSPIRONE HCL 7.5 MG PO TABS: 7.5000 mg | ORAL_TABLET | Freq: Two times a day (BID) | ORAL | Status: DC

## 2012-12-16 NOTE — Progress Notes (Signed)
  Subjective:    Patient ID: Jamie Frost, male    DOB: 1975-04-30, 37 y.o.   MRN: [id number]  HPI The PT is here for follow up and re-evaluation of chronic medical conditions, medication management and review of any available recent lab and radiology data.  Preventive health is updated, specifically  Cancer screening and Immunization.  Still needs eye exam, finances are an issue   The PT denies any adverse reactions to current medications since the last visit.  2 week h/o pruritic rash on left neck, no drainage, fever or chills. Not reducing intake or exercising as much as needed to lose weight , needs to work on this. Denies polyuria, polydipsia or blurred vision    Review of Systems See HPI Denies recent fever or chills. Denies sinus pressure, nasal congestion, ear pain or sore throat. Denies chest congestion, productive cough or wheezing. Denies chest pains, palpitations and leg swelling Denies abdominal pain, nausea, vomiting,diarrhea or constipation.   Denies dysuria, frequency, hesitancy or incontinence. Denies joint pain, swelling and limitation in mobility. Denies headaches, seizures, numbness, or tingling. Denies depression, anxiety or insomnia.        Objective:   Physical Exam  Patient alert and oriented and in no cardiopulmonary distress.  HEENT: No facial asymmetry, EOMI, no sinus tenderness,  oropharynx pink and moist.  Neck supple no adenopathy.  Chest: Clear to auscultation bilaterally.  CVS: S1, S2 no murmurs, no S3.  ABD: Soft non tender.   Ext: No edema  MS: Adequate ROM spine, shoulders, hips and knees.  Skin: Intact, fungal  rash noted on left anterior neck, max diameter approx 2 inches  Psych: Good eye contact, normal affect. Memory  Impaired not anxious or depressed appearing.  CNS: CN 2-12 intact, power, tone and sensation normal throughout.       Assessment & Plan:

## 2012-12-16 NOTE — Patient Instructions (Addendum)
F/u Feb 20, call if you need me before  Flu vaccine today.  You need to make and keep an eye appointment  Please cut back on starchy and sweet food and move more so that you lose weight  HBA1C, Vit D , cmp and EGFR, fasting lipid and CBC in Feb  Cream prescribed  For rash on neck due to fungal infection for 2 weeks

## 2012-12-16 NOTE — Assessment & Plan Note (Addendum)
Rash on left neck x 2 weeks, antifungal cream prescribed

## 2012-12-16 NOTE — Assessment & Plan Note (Signed)
Updated lab needed  Patient advised to reduce carb and sweets, commit to regular physical activity, take meds as prescribed, test blood as directed, and attempt to lose weight, to improve blood sugar control.  

## 2012-12-16 NOTE — Assessment & Plan Note (Signed)
Controlled, no change in medication  

## 2012-12-16 NOTE — Assessment & Plan Note (Signed)
Controlled, no change in medication DASH diet and commitment to daily physical activity for a minimum of 30 minutes discussed and encouraged, as a part of hypertension management. The importance of attaining a healthy weight is also discussed.  

## 2012-12-16 NOTE — Assessment & Plan Note (Signed)
Unchanged. Patient re-educated about  the importance of commitment to a  minimum of 150 minutes of exercise per week. The importance of healthy food choices with portion control discussed. Encouraged to start a food diary, count calories and to consider  joining a support group. Sample diet sheets offered. Goals set by the patient for the next several months.    

## 2012-12-16 NOTE — Assessment & Plan Note (Signed)
Hyperlipidemia:Low fat diet discussed and encouraged.  Updated lab next visit 

## 2012-12-17 LAB — BASIC METABOLIC PANEL WITH GFR
Calcium: 10 mg/dL (ref 8.4–10.5)
Chloride: 98 mEq/L (ref 96–112)
Creat: 1 mg/dL (ref 0.50–1.35)
GFR, Est African American: 110 mL/min
GFR, Est Non African American: 95 mL/min
Sodium: 137 mEq/L (ref 135–145)

## 2012-12-17 LAB — MICROALBUMIN / CREATININE URINE RATIO: Microalb, Ur: 0.5 mg/dL (ref 0.00–1.89)

## 2013-04-25 ENCOUNTER — Ambulatory Visit: Payer: Medicaid Other | Admitting: Family Medicine

## 2013-07-08 ENCOUNTER — Other Ambulatory Visit: Payer: Self-pay | Admitting: Family Medicine

## 2013-07-08 LAB — CBC
HCT: 38.7 % — ABNORMAL LOW (ref 39.0–52.0)
HEMOGLOBIN: 12.1 g/dL — AB (ref 13.0–17.0)
MCH: 25.3 pg — AB (ref 26.0–34.0)
MCHC: 31.3 g/dL (ref 30.0–36.0)
MCV: 81 fL (ref 78.0–100.0)
Platelets: 375 10*3/uL (ref 150–400)
RBC: 4.78 MIL/uL (ref 4.22–5.81)
RDW: 15.5 % (ref 11.5–15.5)
WBC: 11 10*3/uL — ABNORMAL HIGH (ref 4.0–10.5)

## 2013-07-08 LAB — COMPLETE METABOLIC PANEL WITH GFR
ALBUMIN: 4 g/dL (ref 3.5–5.2)
ALK PHOS: 79 U/L (ref 39–117)
ALT: 13 U/L (ref 0–53)
AST: 14 U/L (ref 0–37)
BILIRUBIN TOTAL: 0.5 mg/dL (ref 0.2–1.2)
BUN: 13 mg/dL (ref 6–23)
CO2: 30 mEq/L (ref 19–32)
Calcium: 10 mg/dL (ref 8.4–10.5)
Chloride: 100 mEq/L (ref 96–112)
Creat: 1.15 mg/dL (ref 0.50–1.35)
GFR, Est African American: >89 mL/min
GFR, Est Non African American: 80 mL/min
Glucose, Bld: 102 mg/dL — ABNORMAL HIGH (ref 70–99)
POTASSIUM: 4.5 meq/L (ref 3.5–5.3)
Sodium: 139 mEq/L (ref 135–145)
TOTAL PROTEIN: 7.6 g/dL (ref 6.0–8.3)

## 2013-07-08 LAB — LIPID PANEL
Cholesterol: 154 mg/dL (ref 0–200)
HDL: 52 mg/dL (ref >39)
LDL Cholesterol: 89 mg/dL (ref 0–99)
Total CHOL/HDL Ratio: 3 Ratio
Triglycerides: 64 mg/dL (ref <150)
VLDL: 13 mg/dL (ref 0–40)

## 2013-07-08 LAB — HEMOGLOBIN A1C
HEMOGLOBIN A1C: 6.3 % — AB (ref <5.7)
Mean Plasma Glucose: 134 mg/dL — ABNORMAL HIGH (ref <117)

## 2013-07-09 LAB — VITAMIN D 25 HYDROXY (VIT D DEFICIENCY, FRACTURES): Vit D, 25-Hydroxy: 45 ng/mL (ref 30–89)

## 2013-07-09 LAB — FERRITIN: Ferritin: 53 ng/mL (ref 22–322)

## 2013-07-09 LAB — IRON: IRON: 41 ug/dL — AB (ref 42–165)

## 2013-07-10 ENCOUNTER — Ambulatory Visit: Payer: Medicaid Other | Admitting: Family Medicine

## 2013-07-10 ENCOUNTER — Other Ambulatory Visit: Payer: Self-pay | Admitting: Family Medicine

## 2013-08-15 ENCOUNTER — Other Ambulatory Visit: Payer: Self-pay | Admitting: Family Medicine

## 2013-09-10 ENCOUNTER — Encounter: Payer: Self-pay | Admitting: Family Medicine

## 2013-09-10 ENCOUNTER — Encounter (INDEPENDENT_AMBULATORY_CARE_PROVIDER_SITE_OTHER): Payer: Self-pay

## 2013-09-10 ENCOUNTER — Ambulatory Visit (INDEPENDENT_AMBULATORY_CARE_PROVIDER_SITE_OTHER): Payer: Medicaid Other | Admitting: Family Medicine

## 2013-09-10 VITALS — BP 126/82 | HR 84 | Resp 18 | Ht 66.5 in | Wt 340.1 lb

## 2013-09-10 DIAGNOSIS — R7989 Other specified abnormal findings of blood chemistry: Secondary | ICD-10-CM

## 2013-09-10 DIAGNOSIS — E785 Hyperlipidemia, unspecified: Secondary | ICD-10-CM

## 2013-09-10 DIAGNOSIS — I10 Essential (primary) hypertension: Secondary | ICD-10-CM

## 2013-09-10 DIAGNOSIS — L678 Other hair color and hair shaft abnormalities: Secondary | ICD-10-CM

## 2013-09-10 DIAGNOSIS — F411 Generalized anxiety disorder: Secondary | ICD-10-CM

## 2013-09-10 DIAGNOSIS — E1169 Type 2 diabetes mellitus with other specified complication: Secondary | ICD-10-CM

## 2013-09-10 DIAGNOSIS — L738 Other specified follicular disorders: Secondary | ICD-10-CM

## 2013-09-10 DIAGNOSIS — Z6841 Body Mass Index (BMI) 40.0 and over, adult: Secondary | ICD-10-CM

## 2013-09-10 DIAGNOSIS — E669 Obesity, unspecified: Secondary | ICD-10-CM

## 2013-09-10 DIAGNOSIS — E119 Type 2 diabetes mellitus without complications: Secondary | ICD-10-CM

## 2013-09-10 DIAGNOSIS — L739 Follicular disorder, unspecified: Secondary | ICD-10-CM

## 2013-09-10 MED ORDER — DOXYCYCLINE HYCLATE 100 MG PO TABS: 100.0000 mg | ORAL_TABLET | Freq: Two times a day (BID) | ORAL | Status: DC

## 2013-09-10 MED ORDER — MUPIROCIN CALCIUM 2 % EX CREA: 1.0000 "application " | TOPICAL_CREAM | Freq: Two times a day (BID) | CUTANEOUS | Status: DC

## 2013-09-10 NOTE — Patient Instructions (Addendum)
F/u in 3.5 month, call if you need  Me before  Chem 7 and EGFr, hBA1C and TSh in 3.5 month  Antibiotic oint prescribed for use when you start getting bumps and tablets prescribed if you get infected bumps  You are referred for eye exam You have gained weight and need to work on this to start losing weight again so that your health improves 

## 2013-09-11 ENCOUNTER — Other Ambulatory Visit: Payer: Self-pay | Admitting: Family Medicine

## 2013-09-14 NOTE — Assessment & Plan Note (Signed)
Controlled, no change in medication Hyperlipidemia:Low fat diet discussed and encouraged.  \ 

## 2013-09-14 NOTE — Progress Notes (Signed)
   Subjective:    Patient ID: Jamie Frost, male    DOB: [birth year], 38 y.o.   MRN: [id number]  HPI The PT is here for follow up and re-evaluation of chronic medical conditions, medication management and review of any available recent lab and radiology data.  Preventive health is updated, specifically  Cancer screening and Immunization.  Needs eye exam . The PT denies any adverse reactions to current medications since the last visit.  C/o weight gain, with less regular physical activity, and increased food intake C/o recurrent boils under armpits , currently c/o swollen left "knot" in left armpit which he burst about 2 weeks ago, now much improved, no pain or draiange Denies polyuria, polydipsia or hypoglycemic episodes    Review of Systems See HPI Denies recent fever or chills. Denies sinus pressure, nasal congestion, ear pain or sore throat. Denies chest congestion, productive cough or wheezing. Denies chest pains, palpitations and leg swelling Denies abdominal pain, nausea, vomiting,diarrhea or constipation.   Denies dysuria, frequency, hesitancy or incontinence. Denies joint pain, swelling and does  limitation in mobility primarily due to size Denies headaches, seizures, numbness, or tingling. Denies depression, uncontrolled  anxiety or insomnia.         Objective:   Physical Exam BP 126/82  Pulse 84  Resp 18  Ht 5' 6.5" (1.689 m)  Wt 340 lb 1.9 oz (154.277 kg)  BMI 54.08 kg/m2  SpO2 99% Patient alert and oriented and in no cardiopulmonary distress.Mild MR, morbidly obese  HEENT: No facial asymmetry, EOMI,   oropharynx pink and moist.  Neck supple no JVD, no mass.  Chest: Clear to auscultation bilaterally.Decreased though adequate air entry  CVS: S1, S2 no murmurs, no S3.Regular rate.  ABD: Soft non tender.   Ext: No edema  MS: Adequate ROM spine, shoulders, hips and knees.  Skin: Intact, no ulcerations or rash noted.non tendr nodule in left axilla in  area where pt recent had skin infection  Psych: Good eye contact, normal affect. Memory impairedt not anxious or depressed appearing.  CNS: CN 2-12 intact, power,  normal throughout.no focal deficits noted.        Assessment & Plan:  HYPERTENSION Controlled, no change in medication DASH diet and commitment to daily physical activity for a minimum of 30 minutes discussed and encouraged, as a part of hypertension management. The importance of attaining a healthy weight is also discussed.   Diabetes mellitus type 2 in obese Controlled, no change in medication Patient advised to reduce carb and sweets, commit to regular physical activity, take meds as prescribed, test blood as directed, and attempt to lose weight, to improve blood sugar control.   Folliculitis Recurrent episodes esp in axillae, topical ointment for use when early signs of infection start, script fir doxycycline for use if active infection develops, mother and patient educated  GENERALIZED ANXIETY DISORDER Controlled, no change in medication   HYPERLIPIDEMIA Controlled, no change in medication Hyperlipidemia:Low fat diet discussed and encouraged.    Morbid obesity with body mass index of 50.0-59.9 in adult Deteriorated. Patient re-educated about  the importance of commitment to a  minimum of 150 minutes of exercise per week. The importance of healthy food choices with portion control discussed. Encouraged to start a food diary, count calories and to consider  joining a support group. Sample diet sheets offered. Goals set by the patient for the next several months.

## 2013-09-14 NOTE — Assessment & Plan Note (Signed)
Deteriorated. Patient re-educated about  the importance of commitment to a  minimum of 150 minutes of exercise per week. The importance of healthy food choices with portion control discussed. Encouraged to start a food diary, count calories and to consider  joining a support group. Sample diet sheets offered. Goals set by the patient for the next several months.    

## 2013-09-14 NOTE — Assessment & Plan Note (Signed)
Controlled, no change in medication Patient advised to reduce carb and sweets, commit to regular physical activity, take meds as prescribed, test blood as directed, and attempt to lose weight, to improve blood sugar control.  

## 2013-09-14 NOTE — Assessment & Plan Note (Signed)
Recurrent episodes esp in axillae, topical ointment for use when early signs of infection start, script fir doxycycline for use if active infection develops, mother and patient educated

## 2013-09-14 NOTE — Assessment & Plan Note (Signed)
Controlled, no change in medication  

## 2013-09-14 NOTE — Assessment & Plan Note (Signed)
Controlled, no change in medication DASH diet and commitment to daily physical activity for a minimum of 30 minutes discussed and encouraged, as a part of hypertension management. The importance of attaining a healthy weight is also discussed.  

## 2013-12-12 ENCOUNTER — Other Ambulatory Visit: Payer: Self-pay | Admitting: Family Medicine

## 2013-12-13 LAB — COMPLETE METABOLIC PANEL WITH GFR
ALT: 10 U/L (ref 0–53)
AST: 14 U/L (ref 0–37)
Albumin: 4.2 g/dL (ref 3.5–5.2)
Alkaline Phosphatase: 83 U/L (ref 39–117)
BUN: 12 mg/dL (ref 6–23)
CO2: 25 meq/L (ref 19–32)
Calcium: 9.9 mg/dL (ref 8.4–10.5)
Chloride: 100 mEq/L (ref 96–112)
Creat: 0.97 mg/dL (ref 0.50–1.35)
GLUCOSE: 82 mg/dL (ref 70–99)
POTASSIUM: 4.5 meq/L (ref 3.5–5.3)
SODIUM: 139 meq/L (ref 135–145)
TOTAL PROTEIN: 8.2 g/dL (ref 6.0–8.3)
Total Bilirubin: 0.5 mg/dL (ref 0.2–1.2)

## 2013-12-13 LAB — HEMOGLOBIN A1C
Hgb A1c MFr Bld: 6.3 % — ABNORMAL HIGH (ref <5.7)
Mean Plasma Glucose: 134 mg/dL — ABNORMAL HIGH (ref <117)

## 2013-12-13 LAB — TSH: TSH: 1.96 u[IU]/mL (ref 0.350–4.500)

## 2013-12-17 ENCOUNTER — Encounter (INDEPENDENT_AMBULATORY_CARE_PROVIDER_SITE_OTHER): Payer: Self-pay

## 2013-12-17 ENCOUNTER — Ambulatory Visit (INDEPENDENT_AMBULATORY_CARE_PROVIDER_SITE_OTHER): Payer: Medicaid Other | Admitting: Family Medicine

## 2013-12-17 ENCOUNTER — Encounter: Payer: Self-pay | Admitting: Family Medicine

## 2013-12-17 VITALS — BP 112/80 | HR 100 | Resp 18 | Ht 66.75 in | Wt 340.0 lb

## 2013-12-17 DIAGNOSIS — E119 Type 2 diabetes mellitus without complications: Secondary | ICD-10-CM

## 2013-12-17 DIAGNOSIS — I10 Essential (primary) hypertension: Secondary | ICD-10-CM

## 2013-12-17 DIAGNOSIS — J301 Allergic rhinitis due to pollen: Secondary | ICD-10-CM

## 2013-12-17 DIAGNOSIS — F411 Generalized anxiety disorder: Secondary | ICD-10-CM

## 2013-12-17 DIAGNOSIS — E669 Obesity, unspecified: Secondary | ICD-10-CM

## 2013-12-17 DIAGNOSIS — E785 Hyperlipidemia, unspecified: Secondary | ICD-10-CM

## 2013-12-17 DIAGNOSIS — E1169 Type 2 diabetes mellitus with other specified complication: Secondary | ICD-10-CM

## 2013-12-17 DIAGNOSIS — Z23 Encounter for immunization: Secondary | ICD-10-CM | POA: Insufficient documentation

## 2013-12-17 DIAGNOSIS — Z6841 Body Mass Index (BMI) 40.0 and over, adult: Secondary | ICD-10-CM

## 2013-12-17 MED ORDER — ALBUTEROL SULFATE (2.5 MG/3ML) 0.083% IN NEBU
2.5000 mg | INHALATION_SOLUTION | Freq: Four times a day (QID) | RESPIRATORY_TRACT | Status: DC | PRN
Start: 2013-12-17 — End: 2014-07-08

## 2013-12-17 NOTE — Assessment & Plan Note (Signed)
Vaccine administered at visit.  

## 2013-12-17 NOTE — Assessment & Plan Note (Signed)
Lab added to current, will f/u Hyperlipidemia:Low fat diet discussed and encouraged.

## 2013-12-17 NOTE — Assessment & Plan Note (Signed)
Controlled, no change in medication DASH diet and commitment to daily physical activity for a minimum of 30 minutes discussed and encouraged, as a part of hypertension management. The importance of attaining a healthy weight is also discussed.  

## 2013-12-17 NOTE — Assessment & Plan Note (Signed)
Controlled, no change in medication  

## 2013-12-17 NOTE — Assessment & Plan Note (Signed)
Controlled, no change in medication Patient advised to reduce carb and sweets, commit to regular physical activity, take meds as prescribed, test blood as directed, and attempt to lose weight, to improve blood sugar control.  

## 2013-12-17 NOTE — Assessment & Plan Note (Signed)
Controlled, no change in medication Daily claritin as before

## 2013-12-17 NOTE — Patient Instructions (Signed)
F/u in 4.5 month, call if you need me before  Flu vaccine today  Microalb today  hBA1C and chem 7 in 4.5 month

## 2013-12-17 NOTE — Assessment & Plan Note (Signed)
Unchanged. Patient re-educated about  the importance of commitment to a  minimum of 150 minutes of exercise per week. The importance of healthy food choices with portion control discussed. Encouraged to start a food diary, count calories and to consider  joining a support group. Sample diet sheets offered. Goals set by the patient for the next several months.    

## 2013-12-17 NOTE — Progress Notes (Signed)
   Subjective:    Patient ID: Jamie Frost, male    DOB: 1975/09/14, 38 y.o.   MRN: [id number]  HPI The PT is here for follow up and re-evaluation of chronic medical conditions, medication management and review of any available recent lab and radiology data.  Preventive health is updated, specifically  Immunization.    The PT denies any adverse reactions to current medications since the last visit.  There are no new concerns.  There are no specific complaints       Review of Systems See HPI Denies recent fever or chills. Denies sinus pressure, nasal congestion, ear pain or sore throat. Denies chest congestion, productive cough or wheezing. Denies chest pains, palpitations and leg swelling Denies abdominal pain, nausea, vomiting,diarrhea or constipation.   Denies dysuria, frequency, hesitancy or incontinence. Denies joint pain, has chronic  limitation in mobility primarily due to size Denies headaches, seizures, numbness, or tingling. Denies depression, anxiety or insomnia. Denies skin break down or rash.        Objective:   Physical Exam  BP 112/80  Pulse 100  Resp 18  Ht 5' 6.75" (1.695 m)  Wt 340 lb (154.223 kg)  BMI 53.68 kg/m2  SpO2 100% Patient alert and oriented and in no cardiopulmonary distress.  HEENT: No facial asymmetry, EOMI,   oropharynx pink and moist.  Neck supple no JVD, no mass.  Chest: Clear to auscultation bilaterally.  CVS: S1, S2 no murmurs, no S3.Regular rate.  ABD: Soft non tender.   Ext: No edema  MS: Adequate ROM spine, shoulders, hips and knees.  Skin: Intact, no ulcerations or rash noted.  Psych: Good eye contact, normal affect. Mild MR, not anxious or depressed appearing.  CNS: CN 2-12 intact, power,  normal throughout.no focal deficits noted.        Assessment & Plan:  Essential hypertension Controlled, no change in medication DASH diet and commitment to daily physical activity for a minimum of 30 minutes  discussed and encouraged, as a part of hypertension management. The importance of attaining a healthy weight is also discussed.   ALLERGIC RHINITIS, SEASONAL Controlled, no change in medication Daily claritin as before  Diabetes mellitus type 2 in obese Controlled, no change in medication Patient advised to reduce carb and sweets, commit to regular physical activity, take meds as prescribed, test blood as directed, and attempt to lose weight, to improve blood sugar control.   GENERALIZED ANXIETY DISORDER Controlled, no change in medication   Hyperlipidemia LDL goal <100 Lab added to current, will f/u Hyperlipidemia:Low fat diet discussed and encouraged.    Morbid obesity with body mass index of 50.0-59.9 in adult Unchanged Patient re-educated about  the importance of commitment to a  minimum of 150 minutes of exercise per week. The importance of healthy food choices with portion control discussed. Encouraged to start a food diary, count calories and to consider  joining a support group. Sample diet sheets offered. Goals set by the patient for the next several months.     Need for prophylactic vaccination and inoculation against influenza Vaccine administered at visit.

## 2013-12-18 LAB — HEPATIC FUNCTION PANEL
ALBUMIN: 4.3 g/dL (ref 3.5–5.2)
ALT: 11 U/L (ref 0–53)
AST: 13 U/L (ref 0–37)
Alkaline Phosphatase: 86 U/L (ref 39–117)
BILIRUBIN DIRECT: 0.1 mg/dL (ref 0.0–0.3)
BILIRUBIN TOTAL: 0.4 mg/dL (ref 0.2–1.2)
Indirect Bilirubin: 0.3 mg/dL (ref 0.2–1.2)
Total Protein: 8 g/dL (ref 6.0–8.3)

## 2013-12-18 LAB — LIPID PANEL
CHOL/HDL RATIO: 3.2 ratio
Cholesterol: 163 mg/dL (ref 0–200)
HDL: 51 mg/dL (ref >39)
LDL Cholesterol: 98 mg/dL (ref 0–99)
Triglycerides: 70 mg/dL (ref <150)
VLDL: 14 mg/dL (ref 0–40)

## 2013-12-19 LAB — MICROALBUMIN / CREATININE URINE RATIO
Creatinine, Urine: 110.9 mg/dL
Microalb Creat Ratio: 2.7 mg/g (ref 0.0–30.0)
Microalb, Ur: 0.3 mg/dL (ref <2.0)

## 2014-02-13 ENCOUNTER — Other Ambulatory Visit: Payer: Self-pay | Admitting: Family Medicine

## 2014-04-24 ENCOUNTER — Other Ambulatory Visit: Payer: Self-pay

## 2014-04-24 MED ORDER — BUSPIRONE HCL 15 MG PO TABS: 7.5000 mg | ORAL_TABLET | Freq: Two times a day (BID) | ORAL | Status: DC

## 2014-04-24 MED ORDER — BENAZEPRIL HCL 10 MG PO TABS: ORAL_TABLET | ORAL | Status: DC

## 2014-04-24 MED ORDER — SIMVASTATIN 40 MG PO TABS: 40.0000 mg | ORAL_TABLET | Freq: Every day | ORAL | Status: DC

## 2014-04-24 MED ORDER — HYDROCHLOROTHIAZIDE 25 MG PO TABS: ORAL_TABLET | ORAL | Status: DC

## 2014-04-24 MED ORDER — METFORMIN HCL ER 750 MG PO TB24: 750.0000 mg | ORAL_TABLET | Freq: Two times a day (BID) | ORAL | Status: DC

## 2014-05-16 LAB — BASIC METABOLIC PANEL
BUN: 10 mg/dL (ref 6–23)
CO2: 26 mEq/L (ref 19–32)
Calcium: 9.6 mg/dL (ref 8.4–10.5)
Chloride: 101 mEq/L (ref 96–112)
Creat: 1 mg/dL (ref 0.50–1.35)
Glucose, Bld: 75 mg/dL (ref 70–99)
POTASSIUM: 4.1 meq/L (ref 3.5–5.3)
Sodium: 137 mEq/L (ref 135–145)

## 2014-05-16 LAB — HEMOGLOBIN A1C
Hgb A1c MFr Bld: 6.3 % — ABNORMAL HIGH (ref <5.7)
Mean Plasma Glucose: 134 mg/dL — ABNORMAL HIGH (ref <117)

## 2014-05-20 ENCOUNTER — Ambulatory Visit (INDEPENDENT_AMBULATORY_CARE_PROVIDER_SITE_OTHER): Payer: Medicaid Other | Admitting: Family Medicine

## 2014-05-20 ENCOUNTER — Encounter: Payer: Self-pay | Admitting: Family Medicine

## 2014-05-20 VITALS — BP 118/80 | HR 104 | Resp 16 | Ht 67.0 in | Wt 356.0 lb

## 2014-05-20 DIAGNOSIS — Z6841 Body Mass Index (BMI) 40.0 and over, adult: Secondary | ICD-10-CM

## 2014-05-20 DIAGNOSIS — E119 Type 2 diabetes mellitus without complications: Secondary | ICD-10-CM

## 2014-05-20 DIAGNOSIS — E785 Hyperlipidemia, unspecified: Secondary | ICD-10-CM

## 2014-05-20 DIAGNOSIS — D649 Anemia, unspecified: Secondary | ICD-10-CM

## 2014-05-20 DIAGNOSIS — E669 Obesity, unspecified: Secondary | ICD-10-CM

## 2014-05-20 DIAGNOSIS — I10 Essential (primary) hypertension: Secondary | ICD-10-CM

## 2014-05-20 DIAGNOSIS — F411 Generalized anxiety disorder: Secondary | ICD-10-CM

## 2014-05-20 DIAGNOSIS — E1169 Type 2 diabetes mellitus with other specified complication: Secondary | ICD-10-CM

## 2014-05-20 DIAGNOSIS — J301 Allergic rhinitis due to pollen: Secondary | ICD-10-CM

## 2014-05-20 MED ORDER — MOMETASONE FUROATE 50 MCG/ACT NA SUSP: 2.0000 | Freq: Every day | NASAL | Status: DC

## 2014-05-20 MED ORDER — PREDNISONE 5 MG PO TABS: 5.0000 mg | ORAL_TABLET | Freq: Two times a day (BID) | ORAL | Status: DC

## 2014-05-20 NOTE — Assessment & Plan Note (Signed)
Deteriorated. Patient re-educated about  the importance of commitment to a  minimum of 150 minutes of exercise per week. The importance of healthy food choices with portion control discussed. Encouraged to start a food diary, count calories and to consider  joining a support group. Sample diet sheets offered. Goals set by the patient for the next several months.    

## 2014-05-20 NOTE — Patient Instructions (Signed)
F/u end July, call if you need me before  New addtional medication for your allergies , nasal; spray and short course of prednisone  Need to change eating, more vegetables and smaller portions so that you lose weight    Fasting lipid, cmp and EGFR, HBA1C , CBc , iron and ferritin and Vit D in July , before f/u  Thanks for choosing [facility], we consider it a privelige to serve you.

## 2014-05-20 NOTE — Assessment & Plan Note (Signed)
Controlled, no change in medication DASH diet and commitment to daily physical activity for a minimum of 30 minutes discussed and encouraged, as a part of hypertension management. The importance of attaining a healthy weight is also discussed.  

## 2014-05-20 NOTE — Progress Notes (Signed)
   Subjective:    Patient ID: Jamie Frost, male    DOB: 05-Jul-1975, 39 y.o.   MRN: [id number]  HPI The PT is here for follow up and re-evaluation of chronic medical conditions, medication management and review of any available recent lab and radiology data.  Preventive health is updated, specifically  Cancer screening and Immunization.   Questions or concerns regarding consultations or procedures which the PT has had in the interim are  addressed. The PT denies any adverse reactions to current medications since the last visit.  1 week h/o increased cough , chest congestion , sneezing and clear nasal d/c . No change in appetite or behavior Significant weight gain in last 5 months due to increased food intake    Review of Systems See HPI, history provided by his mother Rosey Batheresa as he has moderate MR Denies recent fever or chills.  Denies chest congestion, productive cough or wheezing. Denies chest pains, palpitations and leg swelling Denies abdominal pain, nausea, vomiting,diarrhea or constipation.   Denies dysuria, frequency, hesitancy or incontinence. Denies joint pain, swelling and limitation in mobility. Denies headaches, seizures, numbness, or tingling. Denies depression, anxiety or insomnia. Denies skin break down or rash.        Objective:   Physical Exam BP 118/80 mmHg  Pulse 104  Resp 16  Ht 5\' 7"  (1.702 m)  Wt 356 lb (161.481 kg)  BMI 55.74 kg/m2  SpO2 96% Patient alert and oriented and in no cardiopulmonary distress.  HEENT: No facial asymmetry, EOMI,   oropharynx pink and moist.  Neck supple no JVD, no mass. Periorbital edema, and increased nasal congestion and erythema Chest: Clear to auscultation bilaterally.  CVS: S1, S2 no murmurs, no S3.Regular rate.  ABD: Soft non tender.   Ext: No edema  MS: Adequate ROM spine, shoulders, hips and knees.  Skin: Intact, no ulcerations or rash noted.  Psych: Good eye contact, normal affect. Memory intact not  anxious or depressed appearing.  CNS: CN 2-12 intact, power,  normal throughout.no focal deficits noted.        Assessment & Plan:  ALLERGIC RHINITIS, SEASONAL Uncontrolled , add nasal spray and short steroid course   Morbid obesity with body mass index of 50.0-59.9 in adult Deteriorated. Patient re-educated about  the importance of commitment to a  minimum of 150 minutes of exercise per week. The importance of healthy food choices with portion control discussed. Encouraged to start a food diary, count calories and to consider  joining a support group. Sample diet sheets offered. Goals set by the patient for the next several months.      Diabetes mellitus type 2 in obese Controlled, no change in medication Patient advised to reduce carb and sweets, commit to regular physical activity, take meds as prescribed, test blood as directed, and attempt to lose weight, to improve blood sugar control.    Essential hypertension Controlled, no change in medication DASH diet and commitment to daily physical activity for a minimum of 30 minutes discussed and encouraged, as a part of hypertension management. The importance of attaining a healthy weight is also discussed.    GENERALIZED ANXIETY DISORDER Controlled, no change in medication    Hyperlipidemia LDL goal <100 Controlled, no change in medication Hyperlipidemia:Low fat diet discussed and encouraged.  Updated lab needed at/ before next visit.

## 2014-05-20 NOTE — Assessment & Plan Note (Signed)
Controlled, no change in medication Patient advised to reduce carb and sweets, commit to regular physical activity, take meds as prescribed, test blood as directed, and attempt to lose weight, to improve blood sugar control.  

## 2014-05-20 NOTE — Assessment & Plan Note (Signed)
Controlled, no change in medication  

## 2014-05-20 NOTE — Assessment & Plan Note (Signed)
Uncontrolled , add nasal spray and short steroid course

## 2014-05-20 NOTE — Assessment & Plan Note (Signed)
Controlled, no change in medication Hyperlipidemia:Low fat diet discussed and encouraged.  Updated lab needed at/ before next visit.  

## 2014-05-27 ENCOUNTER — Other Ambulatory Visit: Payer: Self-pay

## 2014-05-27 MED ORDER — VITAMIN D (ERGOCALCIFEROL) 1.25 MG (50000 UNIT) PO CAPS: ORAL_CAPSULE | ORAL | Status: DC

## 2014-05-27 MED ORDER — BUSPIRONE HCL 15 MG PO TABS
7.5000 mg | ORAL_TABLET | Freq: Two times a day (BID) | ORAL | Status: DC
Start: 2014-05-27 — End: 2014-09-30

## 2014-05-27 MED ORDER — POTASSIUM CHLORIDE CRYS ER 20 MEQ PO TBCR: EXTENDED_RELEASE_TABLET | ORAL | Status: DC

## 2014-05-27 MED ORDER — SIMVASTATIN 40 MG PO TABS: 40.0000 mg | ORAL_TABLET | Freq: Every day | ORAL | Status: DC

## 2014-05-27 MED ORDER — METFORMIN HCL ER 750 MG PO TB24: 750.0000 mg | ORAL_TABLET | Freq: Two times a day (BID) | ORAL | Status: DC

## 2014-05-27 MED ORDER — LORATADINE 10 MG PO TABS: ORAL_TABLET | ORAL | Status: DC

## 2014-05-27 MED ORDER — HYDROCHLOROTHIAZIDE 25 MG PO TABS: ORAL_TABLET | ORAL | Status: DC

## 2014-05-27 MED ORDER — BENAZEPRIL HCL 10 MG PO TABS: ORAL_TABLET | ORAL | Status: DC

## 2014-07-08 ENCOUNTER — Other Ambulatory Visit: Payer: Self-pay

## 2014-07-08 MED ORDER — ALBUTEROL SULFATE (2.5 MG/3ML) 0.083% IN NEBU: 2.5000 mg | INHALATION_SOLUTION | Freq: Four times a day (QID) | RESPIRATORY_TRACT | Status: DC | PRN

## 2014-09-22 ENCOUNTER — Telehealth: Payer: Self-pay

## 2014-09-22 DIAGNOSIS — M79672 Pain in left foot: Secondary | ICD-10-CM

## 2014-09-22 NOTE — Addendum Note (Signed)
Addended by: Kandis FantasiaSLADE, COURTNEY B on: 09/22/2014 04:39 PM   Modules accepted: Orders

## 2014-09-22 NOTE — Telephone Encounter (Signed)
pls order , I will sign 

## 2014-09-22 NOTE — Telephone Encounter (Signed)
X-ray ordered.

## 2014-09-24 LAB — CBC
HCT: 38.3 % — ABNORMAL LOW (ref 39.0–52.0)
Hemoglobin: 11.8 g/dL — ABNORMAL LOW (ref 13.0–17.0)
MCH: 25.6 pg — AB (ref 26.0–34.0)
MCHC: 30.8 g/dL (ref 30.0–36.0)
MCV: 83.1 fL (ref 78.0–100.0)
MPV: 10.5 fL (ref 8.6–12.4)
Platelets: 374 10*3/uL (ref 150–400)
RBC: 4.61 MIL/uL (ref 4.22–5.81)
RDW: 15.8 % — AB (ref 11.5–15.5)
WBC: 13.5 10*3/uL — ABNORMAL HIGH (ref 4.0–10.5)

## 2014-09-24 LAB — HEMOGLOBIN A1C
HEMOGLOBIN A1C: 6.2 % — AB (ref <5.7)
Mean Plasma Glucose: 131 mg/dL — ABNORMAL HIGH (ref <117)

## 2014-09-24 LAB — FERRITIN: Ferritin: 51 ng/mL (ref 22–322)

## 2014-09-25 ENCOUNTER — Ambulatory Visit (HOSPITAL_COMMUNITY)
Admission: RE | Admit: 2014-09-25 | Discharge: 2014-09-25 | Disposition: A | Discharge status: Home / Self Care | Payer: Medicaid Other | Source: Ambulatory Visit | Attending: Family Medicine | Admitting: Family Medicine

## 2014-09-25 DIAGNOSIS — M79672 Pain in left foot: Secondary | ICD-10-CM | POA: Diagnosis not present

## 2014-09-25 LAB — VITAMIN D 25 HYDROXY (VIT D DEFICIENCY, FRACTURES): Vit D, 25-Hydroxy: 39 ng/mL (ref 30–100)

## 2014-09-25 LAB — COMPLETE METABOLIC PANEL WITH GFR
ALT: 16 U/L (ref 0–53)
AST: 14 U/L (ref 0–37)
Albumin: 3.7 g/dL (ref 3.5–5.2)
Alkaline Phosphatase: 76 U/L (ref 39–117)
BUN: 11 mg/dL (ref 6–23)
CO2: 21 mEq/L (ref 19–32)
CREATININE: 1.05 mg/dL (ref 0.50–1.35)
Calcium: 9.8 mg/dL (ref 8.4–10.5)
Chloride: 100 mEq/L (ref 96–112)
GFR, Est Non African American: 89 mL/min
Glucose, Bld: 87 mg/dL (ref 70–99)
Potassium: 4.2 mEq/L (ref 3.5–5.3)
SODIUM: 140 meq/L (ref 135–145)
Total Bilirubin: 0.5 mg/dL (ref 0.2–1.2)
Total Protein: 7.8 g/dL (ref 6.0–8.3)

## 2014-09-25 LAB — LIPID PANEL
Cholesterol: 122 mg/dL (ref 0–200)
HDL: 41 mg/dL (ref >=40)
LDL Cholesterol: 63 mg/dL (ref 0–99)
Total CHOL/HDL Ratio: 3 Ratio
Triglycerides: 90 mg/dL (ref <150)
VLDL: 18 mg/dL (ref 0–40)

## 2014-09-25 LAB — IRON: Iron: 50 ug/dL (ref 42–165)

## 2014-09-30 ENCOUNTER — Encounter: Payer: Self-pay | Admitting: Family Medicine

## 2014-09-30 ENCOUNTER — Ambulatory Visit (INDEPENDENT_AMBULATORY_CARE_PROVIDER_SITE_OTHER): Payer: Medicaid Other | Admitting: Family Medicine

## 2014-09-30 VITALS — BP 118/80 | HR 92 | Resp 16 | Ht 67.0 in | Wt 344.1 lb

## 2014-09-30 DIAGNOSIS — I10 Essential (primary) hypertension: Secondary | ICD-10-CM | POA: Diagnosis not present

## 2014-09-30 DIAGNOSIS — Q336 Congenital hypoplasia and dysplasia of lung: Secondary | ICD-10-CM

## 2014-09-30 DIAGNOSIS — L97519 Non-pressure chronic ulcer of other part of right foot with unspecified severity: Secondary | ICD-10-CM | POA: Diagnosis not present

## 2014-09-30 DIAGNOSIS — E785 Hyperlipidemia, unspecified: Secondary | ICD-10-CM

## 2014-09-30 DIAGNOSIS — E119 Type 2 diabetes mellitus without complications: Secondary | ICD-10-CM | POA: Diagnosis not present

## 2014-09-30 DIAGNOSIS — F411 Generalized anxiety disorder: Secondary | ICD-10-CM

## 2014-09-30 DIAGNOSIS — Z6841 Body Mass Index (BMI) 40.0 and over, adult: Secondary | ICD-10-CM

## 2014-09-30 DIAGNOSIS — Q333 Agenesis of lung: Secondary | ICD-10-CM

## 2014-09-30 DIAGNOSIS — E669 Obesity, unspecified: Secondary | ICD-10-CM

## 2014-09-30 DIAGNOSIS — M79672 Pain in left foot: Secondary | ICD-10-CM | POA: Diagnosis not present

## 2014-09-30 DIAGNOSIS — E1169 Type 2 diabetes mellitus with other specified complication: Secondary | ICD-10-CM

## 2014-09-30 MED ORDER — BENAZEPRIL HCL 10 MG PO TABS: ORAL_TABLET | ORAL | Status: DC

## 2014-09-30 MED ORDER — BUSPIRONE HCL 15 MG PO TABS: 7.5000 mg | ORAL_TABLET | Freq: Two times a day (BID) | ORAL | Status: DC

## 2014-09-30 MED ORDER — CEFTRIAXONE SODIUM 1 G IJ SOLR
500.0000 mg | Freq: Once | INTRAMUSCULAR | Status: AC
  Administered 2014-09-30: 500 mg via INTRAMUSCULAR

## 2014-09-30 MED ORDER — CEFTRIAXONE SODIUM 1 G IJ SOLR: 500.0000 mg | INTRAMUSCULAR | Status: DC

## 2014-09-30 MED ORDER — AMOXICILLIN-POT CLAVULANATE 875-125 MG PO TABS: 1.0000 | ORAL_TABLET | Freq: Two times a day (BID) | ORAL | Status: DC

## 2014-09-30 NOTE — Progress Notes (Signed)
Subjective:    Patient ID: Jamie Frost, male    DOB: 1975/06/08, 39 y.o.   MRN: [id number]  HPI  Pt in with his aide and his Mom states that she was only aware of laceration to rigth 5th toe as of 2 day ago, states no problem involved last week Friday, noted bleeding from the right toe  2 days ago Left foot swollen and has been unable to weight bear for past 2 weeks, recent x ray of foot negative for fracture. No fever or chills, not in pain He is here for routine follow up visit, otherwise has been doing well and has succeded in both losing weight and improving blood sugar  Review of Systems See HPI Denies recent fever or chills. Denies sinus pressure, nasal congestion, ear pain or sore throat. Denies chest congestion, productive cough or wheezing. Denies chest pains, palpitations and leg swelling Denies abdominal pain, nausea, vomiting,diarrhea or constipation.   Denies dysuria, frequency, hesitancy or incontinence.  Denies headaches, seizures, numbness, or tingling. Denies  Uncontrolled , anxiety or insomnia.       Objective:   Physical Exam BP 118/80 mmHg  Pulse 92  Resp 16  Ht  (1.702 m)  Wt 344 lb 1.9 oz (156.092 kg)  BMI 53.88 kg/m2  SpO2 96%   Patient alert  and in no cardiopulmonary distress.mild MR  HEENT: No facial asymmetry, EOMI,   oropharynx pink and moist.  Neck supple no JVD, no mass.  Chest: Clear to auscultation bilaterally.  CVS: S1, S2 no murmurs, no S3.Regular rate.  ABD: Soft non tender.   Ext: No edema  MS: decreased  ROM spine, shoulders, hips and knees.ankle deformity with in turning of left foot, tender generally to palpation and swollen  Skin: open ulcer  5th toe, no purulent drainage noted, also multiple flea bites on lower extremities.  Psych: Good eye contact, normal affect.  not anxious or depressed appearing.  CNS: CN 2-12 intact, power,  normal throughout.no focal deficits noted.        Assessment & Plan:    Foot ulcer, right Ulcer under right 5th toe, slit present at base of 5th toe with partial amputation, unknown duration per caregiver probably 3 to 5 days Rocephin in office followed by oral antibiotics, cleaned and dressed at visit Urgent referral to podiatry to follow pt until healed fully  Left foot pain Acute onset left foot pain, x approx 2 weeks, pt unable to weight bear, no known trauma, xray shows no fracture, needs further evaluation by podiatry. Walker with seat prescribed to assist with safe ambulationm  Diabetes mellitus type 2 in obese Controlled, no change in medication Jamie Frost is reminded of the importance of commitment to daily physical activity for 30 minutes or more, as able and the need to limit carbohydrate intake to 30 to 60 grams per meal to help with blood sugar control.   The need to take medication as prescribed, test blood sugar as directed, and to call between visits if there is a concern that blood sugar is uncontrolled is also discussed.   Jamie Frost is reminded of the importance of daily foot exam, annual eye examination, and good blood sugar, blood pressure and cholesterol control.  Diabetic Labs Latest Ref Rng 09/24/2014 05/15/2014 12/17/2013 12/12/2013 07/08/2013  HbA1c <5.7 % 6.2(H) 6.3(H) - 6.3(H) 6.3(H)  Microalbumin <2.0 mg/dL - - 0.3 - -  Micro/Creat Ratio 0.0 - 30.0 mg/g - - 2.7 - -  Chol 0 -  200 mg/dL 956 - - 213 086  HDL >=57 mg/dL 41 - - 51 52  Calc LDL 0 - 99 mg/dL 63 - - 98 89  Triglycerides <150 mg/dL 90 - - 70 64  Creatinine 0.50 - 1.35 mg/dL 8.46 9.62 - 9.52 8.41   BP/Weight 09/30/2014 05/20/2014 12/17/2013 09/10/2013 12/16/2012 08/01/2012 09/28/2011  Systolic BP 118 118 112 126 110 130 126  Diastolic BP 80 80 80 82 78 90 74  Wt. (Lbs) 344.12 356 340 340.12 332.4 330.12 340  BMI 53.88 55.74 53.68 54.08 52.48 52.12 53.68   Foot/eye exam completion dates 09/30/2014 09/10/2013  Foot Form Completion Done Done         Essential  hypertension Controlled, no change in medication DASH diet and commitment to daily physical activity for a minimum of 30 minutes discussed and encouraged, as a part of hypertension management. The importance of attaining a healthy weight is also discussed.  BP/Weight 09/30/2014 05/20/2014 12/17/2013 09/10/2013 12/16/2012 08/01/2012 09/28/2011  Systolic BP 118 118 112 126 110 130 126  Diastolic BP 80 80 80 82 78 90 74  Wt. (Lbs) 344.12 356 340 340.12 332.4 330.12 340  BMI 53.88 55.74 53.68 54.08 52.48 52.12 53.68        Pulmonary hypoplasia Maintained on chronic medication to assist breathing which is beneficial, no decompensation  Hyperlipidemia LDL goal <100 Controlled, no change in medication Hyperlipidemia:Low fat diet discussed and encouraged.   Lipid Panel  Lab Results  Component Value Date   CHOL 122 09/24/2014   HDL 41 09/24/2014   LDLCALC 63 09/24/2014   TRIG 90 09/24/2014   CHOLHDL 3.0 09/24/2014        GENERALIZED ANXIETY DISORDER Controlled, no change in medication   Morbid obesity with body mass index of 50.0-59.9 in adult Improved. Patient re-educated about  the importance of commitment to a  minimum of 150 minutes of exercise per week.  The importance of healthy food choices with portion control discussed. Encouraged to start a food diary, count calories and to consider  joining a support group. Sample diet sheets offered. Goals set by the patient for the next several months.   Weight /BMI 09/30/2014 05/20/2014 12/17/2013  WEIGHT 344 lb 1.9 oz 356 lb 340 lb  HEIGHT 5\' 7"  5\' 7"  5' 6.75"  BMI 53.88 kg/m2 55.74 kg/m2 53.68 kg/m2    Current exercise per week 0 minutes.

## 2014-09-30 NOTE — Patient Instructions (Signed)
F/u in 3 month, call if you need me before  Rocephin in office today and antibiotic sent for ulcer under rigth foot  You are referred to podiatrist for soonest availabnble appouiintment to work with problems with both feet  Clean ulcer twice daily with salt water and  Apply dry gauze loosely to protect it  Labs are good ,  No change in medication

## 2014-10-04 DIAGNOSIS — M79672 Pain in left foot: Secondary | ICD-10-CM | POA: Insufficient documentation

## 2014-10-04 NOTE — Assessment & Plan Note (Signed)
Ulcer under right 5th toe, slit present at base of 5th toe with partial amputation, unknown duration per caregiver probably 3 to 5 days Rocephin in office followed by oral antibiotics, cleaned and dressed at visit Urgent referral to podiatry to follow pt until healed fully

## 2014-10-04 NOTE — Assessment & Plan Note (Signed)
Improved. Patient re-educated about  the importance of commitment to a  minimum of 150 minutes of exercise per week.  The importance of healthy food choices with portion control discussed. Encouraged to start a food diary, count calories and to consider  joining a support group. Sample diet sheets offered. Goals set by the patient for the next several months.   Weight /BMI 09/30/2014 05/20/2014 12/17/2013  WEIGHT 344 lb 1.9 oz 356 lb 340 lb  HEIGHT   5' 6.75"  BMI 53.88 kg/m2 55.74 kg/m2 53.68 kg/m2    Current exercise per week 0 minutes.

## 2014-10-04 NOTE — Assessment & Plan Note (Signed)
Controlled, no change in medication  

## 2014-10-04 NOTE — Assessment & Plan Note (Signed)
Controlled, no change in medication Jamie Frost is reminded of the importance of commitment to daily physical activity for 30 minutes or more, as able and the need to limit carbohydrate intake to 30 to 60 grams per meal to help with blood sugar control.   The need to take medication as prescribed, test blood sugar as directed, and to call between visits if there is a concern that blood sugar is uncontrolled is also discussed.   Jamie Frost is reminded of the importance of daily foot exam, annual eye examination, and good blood sugar, blood pressure and cholesterol control.  Diabetic Labs Latest Ref Rng 09/24/2014 05/15/2014 12/17/2013 12/12/2013 07/08/2013  HbA1c <5.7 % 6.2(H) 6.3(H) - 6.3(H) 6.3(H)  Microalbumin <2.0 mg/dL - - 0.3 - -  Micro/Creat Ratio 0.0 - 30.0 mg/g - - 2.7 - -  Chol 0 - 200 mg/dL 161 - - 096 045  HDL >=40 mg/dL 41 - - 51 52  Calc LDL 0 - 99 mg/dL 63 - - 98 89  Triglycerides <150 mg/dL 90 - - 70 64  Creatinine 0.50 - 1.35 mg/dL 9.81 1.91 - 4.78 2.95   BP/Weight 09/30/2014 05/20/2014 12/17/2013 09/10/2013 12/16/2012 08/01/2012 09/28/2011  Systolic BP 118 118 112 126 110 130 126  Diastolic BP 80 80 80 82 78 90 74  Wt. (Lbs) 344.12 356 340 340.12 332.4 330.12 340  BMI 53.88 55.74 53.68 54.08 52.48 52.12 53.68   Foot/eye exam completion dates 09/30/2014 09/10/2013  Foot Form Completion Done Done

## 2014-10-04 NOTE — Assessment & Plan Note (Signed)
Controlled, no change in medication Hyperlipidemia:Low fat diet discussed and encouraged.   Lipid Panel  Lab Results  Component Value Date   CHOL 122 09/24/2014   HDL 41 09/24/2014   LDLCALC 63 09/24/2014   TRIG 90 09/24/2014   CHOLHDL 3.0 09/24/2014

## 2014-10-04 NOTE — Assessment & Plan Note (Signed)
Controlled, no change in medication DASH diet and commitment to daily physical activity for a minimum of 30 minutes discussed and encouraged, as a part of hypertension management. The importance of attaining a healthy weight is also discussed.  BP/Weight 09/30/2014 05/20/2014 12/17/2013 09/10/2013 12/16/2012 08/01/2012 09/28/2011  Systolic BP 118 118 112 126 110 130 126  Diastolic BP 80 80 80 82 78 90 74  Wt. (Lbs) 344.12 356 340 340.12 332.4 330.12 340  BMI 53.88 55.74 53.68 54.08 52.48 52.12 53.68

## 2014-10-04 NOTE — Assessment & Plan Note (Addendum)
Maintained on chronic medication to assist breathing which is beneficial, no decompensation

## 2014-10-04 NOTE — Assessment & Plan Note (Signed)
Acute onset left foot pain, x approx 2 weeks, pt unable to weight bear, no known trauma, xray shows no fracture, needs further evaluation by podiatry. Walker with seat prescribed to assist with safe ambulationm

## 2014-10-05 ENCOUNTER — Telehealth: Payer: Self-pay | Admitting: *Deleted

## 2014-10-05 ENCOUNTER — Telehealth: Payer: Self-pay

## 2014-10-05 DIAGNOSIS — N39498 Other specified urinary incontinence: Secondary | ICD-10-CM

## 2014-10-05 DIAGNOSIS — R2689 Other abnormalities of gait and mobility: Secondary | ICD-10-CM

## 2014-10-05 DIAGNOSIS — R159 Full incontinence of feces: Secondary | ICD-10-CM

## 2014-10-05 NOTE — Telephone Encounter (Signed)
I called [facility] and spoke with Marcelino Duster, she stated pt had appt 10/01/14 and has a follow up on 10/08/14. The 10/01/14 note has not been signed off on yet, and when it is they will fax it over.

## 2014-10-05 NOTE — Telephone Encounter (Signed)
Aunt [name] called requesting to speak with Dr Lodema Hong before she left today. I tried to take a message but she was insisting to talk with Dr. I asked what it was pertaining to and she only said [place]. I took her number and told her I would have Dr call her back [phone]

## 2014-10-06 NOTE — Telephone Encounter (Signed)
I spoke with Aunt yesterday, Jamie Frost is unable to weight ber, incontinent, cannot be managed at home currently , will see if [facility] will diret admit, if not may need to go through eD route Will be calling this am

## 2014-10-07 DIAGNOSIS — R2689 Other abnormalities of gait and mobility: Secondary | ICD-10-CM | POA: Insufficient documentation

## 2014-10-07 DIAGNOSIS — R159 Full incontinence of feces: Secondary | ICD-10-CM | POA: Insufficient documentation

## 2014-10-07 DIAGNOSIS — R32 Unspecified urinary incontinence: Secondary | ICD-10-CM | POA: Insufficient documentation

## 2014-10-07 NOTE — Telephone Encounter (Signed)
Nurse Lorenda Hatchet has been in direct contact with [facility] and pt's mother yesterday. [facility] was going to look on database to see if pt has medicare services in addition to his medicaid. The mother per Toni Amend, is interested in home health services , and equipment to help with his care. I will prescribe commode, incontinence supplies and a urinal Courtney to foloow through on home health referral today, and let Mother know scripts sent for supplies and what is being done

## 2014-10-08 NOTE — Telephone Encounter (Signed)
Called to follow up with mother in reference to [facility] referral.  Mother states that she has not heard from [facility] yet.  Given the # for [facility].  She will call office back if she has not heard from them by this evening.     Patient has a followup with podiatry today.  No voiced concerns made by mother

## 2014-10-09 NOTE — Telephone Encounter (Signed)
pls call and check with Mom next Monday re ability to put weight on left foot, if none  Still he will also be referred to orhto since from podiatery note the only attention is to theulcer. Refer to Dr Romeo Apple if that is the case please I will sign

## 2014-10-12 NOTE — Telephone Encounter (Signed)
GREAT news

## 2014-10-12 NOTE — Telephone Encounter (Addendum)
Does that also meanm that he can weight bear on the left foort again, if so that is good, of NOTE that food did not have laceration

## 2014-10-12 NOTE — Telephone Encounter (Signed)
Patient is regaining mobility and is able to bear weight.  He is no longer having problems with incontinence as he was before.

## 2014-10-12 NOTE — Telephone Encounter (Signed)
Spoke with mother and she states that [facility] came out on 8/6.  Patient is doing much better.   She states that incontinence is better.  He is to followup with podiatry on 8/18

## 2014-12-07 ENCOUNTER — Other Ambulatory Visit: Payer: Self-pay

## 2014-12-07 MED ORDER — ALBUTEROL SULFATE (2.5 MG/3ML) 0.083% IN NEBU: 2.5000 mg | INHALATION_SOLUTION | Freq: Four times a day (QID) | RESPIRATORY_TRACT | Status: DC | PRN

## 2014-12-07 MED ORDER — POTASSIUM CHLORIDE CRYS ER 20 MEQ PO TBCR: EXTENDED_RELEASE_TABLET | ORAL | Status: DC

## 2014-12-21 ENCOUNTER — Ambulatory Visit (INDEPENDENT_AMBULATORY_CARE_PROVIDER_SITE_OTHER): Payer: Medicaid Other

## 2014-12-21 DIAGNOSIS — Z23 Encounter for immunization: Secondary | ICD-10-CM | POA: Diagnosis not present

## 2015-02-11 ENCOUNTER — Other Ambulatory Visit: Payer: Self-pay

## 2015-02-11 MED ORDER — VITAMIN D (ERGOCALCIFEROL) 1.25 MG (50000 UNIT) PO CAPS: ORAL_CAPSULE | ORAL | Status: DC

## 2015-02-11 MED ORDER — LORATADINE 10 MG PO TABS: ORAL_TABLET | ORAL | Status: DC

## 2015-05-05 ENCOUNTER — Telehealth: Payer: Self-pay | Admitting: Family Medicine

## 2015-05-05 NOTE — Telephone Encounter (Signed)
Letter typed

## 2015-05-05 NOTE — Telephone Encounter (Signed)
A Letter needs to be done by Dr. Lodema Hong and faxed to [employer] Fax# [phone] stating that Tim cant get the trash to the road due to medical conditions, please advise?

## 2015-05-21 ENCOUNTER — Ambulatory Visit: Payer: Medicaid Other | Admitting: Family Medicine

## 2015-06-10 ENCOUNTER — Ambulatory Visit: Payer: Medicaid Other | Admitting: Family Medicine

## 2015-07-06 ENCOUNTER — Other Ambulatory Visit: Payer: Self-pay

## 2015-07-06 MED ORDER — HYDROCHLOROTHIAZIDE 25 MG PO TABS: ORAL_TABLET | ORAL | Status: DC

## 2015-07-06 MED ORDER — METFORMIN HCL ER 750 MG PO TB24: 750.0000 mg | ORAL_TABLET | Freq: Two times a day (BID) | ORAL | Status: DC

## 2015-07-06 MED ORDER — SIMVASTATIN 40 MG PO TABS: 40.0000 mg | ORAL_TABLET | Freq: Every day | ORAL | Status: DC

## 2015-09-15 ENCOUNTER — Other Ambulatory Visit (HOSPITAL_COMMUNITY)
Admission: AD | Admit: 2015-09-15 | Discharge: 2015-09-15 | Disposition: A | Discharge status: Home / Self Care | Payer: Medicaid Other | Source: Skilled Nursing Facility | Attending: Family Medicine | Admitting: Family Medicine

## 2015-09-15 ENCOUNTER — Encounter: Payer: Self-pay | Admitting: Family Medicine

## 2015-09-15 ENCOUNTER — Ambulatory Visit (INDEPENDENT_AMBULATORY_CARE_PROVIDER_SITE_OTHER): Payer: Medicaid Other | Admitting: Family Medicine

## 2015-09-15 VITALS — BP 114/80 | HR 80 | Resp 16 | Ht 67.0 in | Wt 285.0 lb

## 2015-09-15 DIAGNOSIS — E119 Type 2 diabetes mellitus without complications: Secondary | ICD-10-CM | POA: Diagnosis not present

## 2015-09-15 DIAGNOSIS — Z23 Encounter for immunization: Secondary | ICD-10-CM

## 2015-09-15 DIAGNOSIS — E669 Obesity, unspecified: Secondary | ICD-10-CM

## 2015-09-15 DIAGNOSIS — Z6841 Body Mass Index (BMI) 40.0 and over, adult: Secondary | ICD-10-CM

## 2015-09-15 DIAGNOSIS — I10 Essential (primary) hypertension: Secondary | ICD-10-CM

## 2015-09-15 DIAGNOSIS — E785 Hyperlipidemia, unspecified: Secondary | ICD-10-CM

## 2015-09-15 DIAGNOSIS — Z114 Encounter for screening for human immunodeficiency virus [HIV]: Secondary | ICD-10-CM | POA: Diagnosis not present

## 2015-09-15 DIAGNOSIS — F411 Generalized anxiety disorder: Secondary | ICD-10-CM

## 2015-09-15 DIAGNOSIS — E1169 Type 2 diabetes mellitus with other specified complication: Secondary | ICD-10-CM

## 2015-09-15 DIAGNOSIS — J301 Allergic rhinitis due to pollen: Secondary | ICD-10-CM

## 2015-09-15 DIAGNOSIS — Z125 Encounter for screening for malignant neoplasm of prostate: Secondary | ICD-10-CM

## 2015-09-15 LAB — GLUCOSE, POCT (MANUAL RESULT ENTRY): POC GLUCOSE: 82 mg/dL (ref 70–99)

## 2015-09-15 NOTE — Patient Instructions (Signed)
F/u in 4 month, call if you need me sooner  Labs today   Microalb from office today  Doing great, no medication changes

## 2015-09-15 NOTE — Progress Notes (Signed)
MERVYN PFLAUM     MRN: [id number]      DOB: [birth year]   HPI Mr. Velardi is here for follow up and re-evaluation of chronic medical conditions, medication management and review of any available recent lab and radiology data.  Preventive health is updated, specifically  Cancer screening and Immunization.    The PT denies any adverse reactions to current medications since the last visit.  There are no new concerns.  There are no specific complaints  Denies polyuria, polydipsia, blurred vision , or hypoglycemic episodes. Has lost a significant amount of weight in the past 1 year, and reports good blood sugars  ROS Denies recent fever or chills. Denies sinus pressure, nasal congestion, ear pain or sore throat. Denies chest congestion, productive cough or wheezing. Denies chest pains, palpitations and leg swelling Denies abdominal pain, nausea, vomiting,diarrhea or constipation.   Denies dysuria, frequency, hesitancy or incontinence. Denies joint pain, swelling and does have  limitation in mobility. Denies headaches, seizures, numbness, or tingling. Denies depression, anxiety or insomnia. Denies skin break down or rash.   PE  BP 114/80 mmHg  Pulse 80  Resp 16  Ht  (1.702 m)  Wt 285 lb (129.275 kg)  BMI 44.63 kg/m2  SpO2 97%  Patient alert and oriented and in no cardiopulmonary distress.Mentwlly challenged, poor historian  HEENT: No facial asymmetry, EOMI,   oropharynx pink and moist.  Neck supple no JVD, no mass.  Chest: Clear to auscultation bilaterally.  CVS: S1, S2 no murmurs, no S3.Regular rate.  ABD: Soft non tender.   Ext: No edema  MS: Adequate ROM spine, shoulders, hips and knees.  Skin: Intact, no ulcerations or rash noted.  Psych: Good eye contact, normal affect. Memory impaired  not anxious or depressed appearing.  CNS: CN 2-12 intact, power,  normal throughout.no focal deficits noted.   Assessment & Plan  Diabetes mellitus type 2 in  obese Improved, reduce dose of  metformin to one daily Mr. Burdell is reminded of the importance of commitment to daily physical activity for 30 minutes or more, as able and the need to limit carbohydrate intake to 30 to 60 grams per meal to help with blood sugar control.   The need to take medication as prescribed, test blood sugar as directed, and to call between visits if there is a concern that blood sugar is uncontrolled is also discussed.   Mr. Topel is reminded of the importance of daily foot exam, annual eye examination, and good blood sugar, blood pressure and cholesterol control.  Diabetic Labs Latest Ref Rng 09/15/2015 09/24/2014 05/15/2014 12/17/2013 12/12/2013  HbA1c <5.7 % - 6.2(H) 6.3(H) - 6.3(H)  Microalbumin Not Estab. ug/mL 15.5(H) - - 0.3 -  Micro/Creat Ratio 0.0 - 30.0 mg/g creat 4.4 - - 2.7 -  Chol 0 - 200 mg/dL - 409 - - 811  HDL >=91 mg/dL - 41 - - 51  Calc LDL 0 - 99 mg/dL - 63 - - 98  Triglycerides <150 mg/dL - 90 - - 70  Creatinine 0.50 - 1.35 mg/dL - 4.78 2.95 - 6.21   BP/Weight 09/15/2015 09/30/2014 05/20/2014 12/17/2013 09/10/2013 12/16/2012 08/01/2012  Systolic BP 114 118 118 112 126 110 130  Diastolic BP 80 80 80 80 82 78 90  Wt. (Lbs) 285 344.12 356 340 340.12 332.4 330.12  BMI 44.63 53.88 55.74 53.68 54.08 52.48 52.12   Foot/eye exam completion dates 09/30/2014 09/10/2013  Foot Form Completion Done Done  Essential hypertension Controlled, no change in medication DASH diet and commitment to daily physical activity for a minimum of 30 minutes discussed and encouraged, as a part of hypertension management. The importance of attaining a healthy weight is also discussed.  BP/Weight 09/15/2015 09/30/2014 05/20/2014 12/17/2013 09/10/2013 12/16/2012 08/01/2012  Systolic BP 114 118 118 112 126 110 130  Diastolic BP 80 80 80 80 82 78 90  Wt. (Lbs) 285 344.12 356 340 340.12 332.4 330.12  BMI 44.63 53.88 55.74 53.68 54.08 52.48 52.12        Morbid  obesity with body mass index of 50.0-59.9 in adult Improved. Pt applauded on succesful weight loss through lifestyle change, and encouraged to continue same. Weight loss goal set for the next several months.   GENERALIZED ANXIETY DISORDER Controlled, no change in medication   ALLERGIC RHINITIS, SEASONAL Controlled, no change in medication   Hyperlipidemia LDL goal <100 Hyperlipidemia:Low fat diet discussed and encouraged.   Lipid Panel  Lab Results  Component Value Date   CHOL 122 09/24/2014   HDL 41 09/24/2014   LDLCALC 63 09/24/2014   TRIG 90 09/24/2014   CHOLHDL 3.0 09/24/2014      Updated lab needed at/ before next visit.

## 2015-09-16 ENCOUNTER — Telehealth: Payer: Self-pay | Admitting: Family Medicine

## 2015-09-16 LAB — MICROALBUMIN / CREATININE URINE RATIO
Creatinine, Urine: 349.4 mg/dL
MICROALB UR: 15.5 ug/mL — AB
Microalb Creat Ratio: 4.4 mg/g creat (ref 0.0–30.0)

## 2015-09-16 NOTE — Telephone Encounter (Signed)
pls reduce metformin dose to metformin Er 750 mg ONE daily, now on 2 , blood sugar MUCH improved, pls also enter HBA1C and LDL; 5.9 and 112, needs to reduce fried and fatty foods, no other med changes. pls also notify pharmacy and send in script, thanks

## 2015-09-17 ENCOUNTER — Other Ambulatory Visit: Payer: Self-pay

## 2015-09-17 MED ORDER — METFORMIN HCL ER 750 MG PO TB24: 750.0000 mg | ORAL_TABLET | Freq: Every day | ORAL | Status: DC

## 2015-09-17 NOTE — Assessment & Plan Note (Signed)
Improved, reduce dose of  metformin to one daily Mr. Jamie Frost is reminded of the importance of commitment to daily physical activity for 30 minutes or more, as able and the need to limit carbohydrate intake to 30 to 60 grams per meal to help with blood sugar control.   The need to take medication as prescribed, test blood sugar as directed, and to call between visits if there is a concern that blood sugar is uncontrolled is also discussed.   Mr. Jamie Frost is reminded of the importance of daily foot exam, annual eye examination, and good blood sugar, blood pressure and cholesterol control.  Diabetic Labs Latest Ref Rng 09/15/2015 09/24/2014 05/15/2014 12/17/2013 12/12/2013  HbA1c <5.7 % - 6.2(H) 6.3(H) - 6.3(H)  Microalbumin Not Estab. ug/mL 15.5(H) - - 0.3 -  Micro/Creat Ratio 0.0 - 30.0 mg/g creat 4.4 - - 2.7 -  Chol 0 - 200 mg/dL - 161122 - - 096163  HDL >=04>=40 mg/dL - 41 - - 51  Calc LDL 0 - 99 mg/dL - 63 - - 98  Triglycerides <150 mg/dL - 90 - - 70  Creatinine 0.50 - 1.35 mg/dL - 5.401.05 9.811.00 - 1.910.97   BP/Weight 09/15/2015 09/30/2014 05/20/2014 12/17/2013 09/10/2013 12/16/2012 08/01/2012  Systolic BP 114 118 118 112 126 110 130  Diastolic BP 80 80 80 80 82 78 90  Wt. (Lbs) 285 344.12 356 340 340.12 332.4 330.12  BMI 44.63 53.88 55.74 53.68 54.08 52.48 52.12   Foot/eye exam completion dates 09/30/2014 09/10/2013  Foot Form Completion Done Done

## 2015-09-17 NOTE — Assessment & Plan Note (Signed)
Improved. Pt applauded on succesful weight loss through lifestyle change, and encouraged to continue same. Weight loss goal set for the next several months.  

## 2015-09-17 NOTE — Telephone Encounter (Signed)
Called patient and left message for them to return call at the office   

## 2015-09-17 NOTE — Assessment & Plan Note (Signed)
Controlled, no change in medication  

## 2015-09-17 NOTE — Assessment & Plan Note (Signed)
Controlled, no change in medication DASH diet and commitment to daily physical activity for a minimum of 30 minutes discussed and encouraged, as a part of hypertension management. The importance of attaining a healthy weight is also discussed.  BP/Weight 09/15/2015 09/30/2014 05/20/2014 12/17/2013 09/10/2013 12/16/2012 08/01/2012  Systolic BP 114 118 118 112 126 110 130  Diastolic BP 80 80 80 80 82 78 90  Wt. (Lbs) 285 344.12 356 340 340.12 332.4 330.12  BMI 44.63 53.88 55.74 53.68 54.08 52.48 52.12

## 2015-09-17 NOTE — Assessment & Plan Note (Signed)
Hyperlipidemia:Low fat diet discussed and encouraged.   Lipid Panel  Lab Results  Component Value Date   CHOL 122 09/24/2014   HDL 41 09/24/2014   LDLCALC 63 09/24/2014   TRIG 90 09/24/2014   CHOLHDL 3.0 09/24/2014   Updated lab needed at/ before next visit.    

## 2015-09-22 ENCOUNTER — Encounter: Payer: Self-pay | Admitting: Family Medicine

## 2015-09-30 NOTE — Telephone Encounter (Signed)
Letter sent.

## 2015-09-30 NOTE — Telephone Encounter (Signed)
Cannot reach patients caregiver but a message was sent to the pharmacy to reduce the dose of metformin. Will mail letter to call the office. (Jamie Frost's medication was left at the office at his last appt but nobody has came yet to collect it.)

## 2015-10-15 ENCOUNTER — Other Ambulatory Visit: Payer: Self-pay

## 2015-10-15 MED ORDER — LORATADINE 10 MG PO TABS: ORAL_TABLET | ORAL | 5 refills | Status: DC

## 2015-10-15 MED ORDER — HYDROCHLOROTHIAZIDE 25 MG PO TABS: ORAL_TABLET | ORAL | 5 refills | Status: DC

## 2015-12-14 ENCOUNTER — Other Ambulatory Visit: Payer: Self-pay

## 2015-12-14 MED ORDER — SIMVASTATIN 40 MG PO TABS: 40.0000 mg | ORAL_TABLET | Freq: Every day | ORAL | 2 refills | Status: DC

## 2015-12-20 ENCOUNTER — Ambulatory Visit (INDEPENDENT_AMBULATORY_CARE_PROVIDER_SITE_OTHER): Payer: Medicare Other

## 2015-12-20 DIAGNOSIS — Z23 Encounter for immunization: Secondary | ICD-10-CM

## 2016-01-18 ENCOUNTER — Ambulatory Visit (INDEPENDENT_AMBULATORY_CARE_PROVIDER_SITE_OTHER): Payer: Medicare Other | Admitting: Family Medicine

## 2016-01-18 ENCOUNTER — Encounter: Payer: Self-pay | Admitting: Family Medicine

## 2016-01-18 VITALS — BP 114/78 | HR 91 | Resp 16 | Ht 67.0 in | Wt 262.0 lb

## 2016-01-18 DIAGNOSIS — I952 Hypotension due to drugs: Secondary | ICD-10-CM

## 2016-01-18 DIAGNOSIS — F411 Generalized anxiety disorder: Secondary | ICD-10-CM

## 2016-01-18 DIAGNOSIS — E1169 Type 2 diabetes mellitus with other specified complication: Secondary | ICD-10-CM | POA: Diagnosis not present

## 2016-01-18 DIAGNOSIS — I10 Essential (primary) hypertension: Secondary | ICD-10-CM

## 2016-01-18 DIAGNOSIS — L03116 Cellulitis of left lower limb: Secondary | ICD-10-CM

## 2016-01-18 DIAGNOSIS — B353 Tinea pedis: Secondary | ICD-10-CM

## 2016-01-18 DIAGNOSIS — L03115 Cellulitis of right lower limb: Secondary | ICD-10-CM

## 2016-01-18 DIAGNOSIS — E669 Obesity, unspecified: Secondary | ICD-10-CM | POA: Diagnosis not present

## 2016-01-18 DIAGNOSIS — E785 Hyperlipidemia, unspecified: Secondary | ICD-10-CM

## 2016-01-18 MED ORDER — CEPHALEXIN 500 MG PO CAPS: 500.0000 mg | ORAL_CAPSULE | Freq: Three times a day (TID) | ORAL | 0 refills | Status: DC

## 2016-01-18 MED ORDER — TERBINAFINE HCL 250 MG PO TABS: 250.0000 mg | ORAL_TABLET | Freq: Every day | ORAL | 0 refills | Status: DC

## 2016-01-18 NOTE — Progress Notes (Signed)
   Barnett Abuimothy L Heathcock     MRN: [id number]      DOB: March 11, 1975   HPI Mr. Clovis RileyMitchell is here for follow up and re-evaluation of chronic medical conditions, medication management and review of any available recent lab and radiology data.  Preventive health is updated, specifically  Cancer screening and Immunization.     Approx 3 times in past 3 weeks he has had near syncopal episodes, no incontinence or jerking noted ROS Denies recent fever or chills. Denies sinus pressure, nasal congestion, ear pain or sore throat. Denies chest congestion, productive cough or wheezing. Denies chest pains, palpitations and leg swelling Denies abdominal pain, nausea, vomiting,diarrhea or constipation.   Denies dysuria, frequency, hesitancy or incontinence.  Denies headaches, seizures, numbness, or tingling. Denies depression, anxiety or insomnia. Denies skin break down or rash.   PE  BP 114/78   Pulse 91   Resp 16   Ht 5\' 7"  (1.702 m)   Wt 262 lb (118.8 kg)   SpO2 99%   BMI 41.04 kg/m   Patient alert and oriented and in no cardiopulmonary distress.  HEENT: No facial asymmetry, EOMI,   oropharynx pink and moist.  Neck supple no JVD, no mass.  Chest: Clear to auscultation bilaterally.  CVS: S1, S2 no murmurs, no S3.Regular rate.  ABD: Soft non tender.   Ext: No edema  MS: Adequate though reduced  ROM spine, shoulders, hips and knees.  Skin: Intact, no ulcerations or rash noted.  Psych: Good eye contact, normal affect.  not anxious or depressed appearing.  CNS: CN 2-12 intact, power,  normal throughout.no focal deficits noted.   Assessment & Plan  Hypotension Light headed on diuretic with significant weight loss, d/c hCTZ, change position slowly  Diabetes mellitus type 2 in obese Light headedness likely due to hypoglycemia on metformin, no longer needs this since lifestyle change  D/c metformin  GENERALIZED ANXIETY DISORDER Controlled, no change in medication   Morbid obesity  (HCC) Improved. Pt applauded on succesful weight loss through lifestyle change, and encouraged to continue same. Weight loss goal set for the next several months.   Hyperlipidemia LDL goal <100 Hyperlipidemia:Low fat diet discussed and encouraged.   Lipid Panel  Lab Results  Component Value Date   CHOL 122 09/24/2014   HDL 41 09/24/2014   LDLCALC 63 09/24/2014   TRIG 90 09/24/2014   CHOLHDL 3.0 09/24/2014   Updated lab needed at/ before next visit.     Cellulitis of both feet Antibiotic prescribed  Tinea pedis of both feet Oral terbinafine prescribed

## 2016-01-18 NOTE — Patient Instructions (Addendum)
F/u in feb 15 , around same time as Mom, call if you need me before  Foot exam shows sores, will send 2 different tablets  HBA1C, cmp and EGFR today  Congrats on weight loss   NO METFORMIN or hydrochorothiazide   Call back if you continue to be light headed

## 2016-01-20 LAB — COMPLETE METABOLIC PANEL WITH GFR
ALT: 11 U/L (ref 9–46)
AST: 14 U/L (ref 10–40)
Albumin: 4.3 g/dL (ref 3.6–5.1)
Alkaline Phosphatase: 75 U/L (ref 40–115)
BUN: 10 mg/dL (ref 7–25)
CHLORIDE: 96 mmol/L — AB (ref 98–110)
CO2: 29 mmol/L (ref 20–31)
Calcium: 10 mg/dL (ref 8.6–10.3)
Creat: 0.92 mg/dL (ref 0.60–1.35)
GFR, Est African American: >89 mL/min (ref >=60)
GFR, Est Non African American: >89 mL/min (ref >=60)
GLUCOSE: 90 mg/dL (ref 65–99)
POTASSIUM: 4.7 mmol/L (ref 3.5–5.3)
SODIUM: 135 mmol/L (ref 135–146)
Total Bilirubin: 0.6 mg/dL (ref 0.2–1.2)
Total Protein: 7.9 g/dL (ref 6.1–8.1)

## 2016-01-21 LAB — HEMOGLOBIN A1C
HEMOGLOBIN A1C: 5.6 % (ref <5.7)
Mean Plasma Glucose: 114 mg/dL

## 2016-01-24 DIAGNOSIS — I959 Hypotension, unspecified: Secondary | ICD-10-CM | POA: Insufficient documentation

## 2016-01-24 DIAGNOSIS — L03115 Cellulitis of right lower limb: Secondary | ICD-10-CM | POA: Insufficient documentation

## 2016-01-24 DIAGNOSIS — L03116 Cellulitis of left lower limb: Secondary | ICD-10-CM

## 2016-01-24 DIAGNOSIS — B353 Tinea pedis: Secondary | ICD-10-CM | POA: Insufficient documentation

## 2016-01-24 NOTE — Assessment & Plan Note (Signed)
Light headed on diuretic with significant weight loss, d/c hCTZ, change position slowly

## 2016-01-24 NOTE — Assessment & Plan Note (Signed)
Controlled, no change in medication  

## 2016-01-24 NOTE — Assessment & Plan Note (Signed)
Oral terbinafine prescribed

## 2016-01-24 NOTE — Assessment & Plan Note (Signed)
Antibiotic prescribed 

## 2016-01-24 NOTE — Assessment & Plan Note (Signed)
Light headedness likely due to hypoglycemia on metformin, no longer needs this since lifestyle change  D/c metformin

## 2016-01-24 NOTE — Assessment & Plan Note (Signed)
Improved. Pt applauded on succesful weight loss through lifestyle change, and encouraged to continue same. Weight loss goal set for the next several months.  

## 2016-01-24 NOTE — Assessment & Plan Note (Signed)
Hyperlipidemia:Low fat diet discussed and encouraged.   Lipid Panel  Lab Results  Component Value Date   CHOL 122 09/24/2014   HDL 41 09/24/2014   LDLCALC 63 09/24/2014   TRIG 90 09/24/2014   CHOLHDL 3.0 09/24/2014   Updated lab needed at/ before next visit.

## 2016-02-02 ENCOUNTER — Other Ambulatory Visit: Payer: Self-pay

## 2016-02-02 DIAGNOSIS — J301 Allergic rhinitis due to pollen: Secondary | ICD-10-CM

## 2016-02-02 MED ORDER — LORATADINE 10 MG PO TABS: ORAL_TABLET | ORAL | 5 refills | Status: DC

## 2016-02-02 MED ORDER — BENAZEPRIL HCL 10 MG PO TABS: ORAL_TABLET | ORAL | 5 refills | Status: DC

## 2016-02-02 MED ORDER — POTASSIUM CHLORIDE CRYS ER 20 MEQ PO TBCR: EXTENDED_RELEASE_TABLET | ORAL | 5 refills | Status: DC

## 2016-02-02 MED ORDER — MOMETASONE FUROATE 50 MCG/ACT NA SUSP: 2.0000 | Freq: Every day | NASAL | 12 refills | Status: DC

## 2016-02-02 MED ORDER — BUSPIRONE HCL 15 MG PO TABS: 7.5000 mg | ORAL_TABLET | Freq: Two times a day (BID) | ORAL | 5 refills | Status: DC

## 2016-02-02 MED ORDER — VITAMIN D (ERGOCALCIFEROL) 1.25 MG (50000 UNIT) PO CAPS: ORAL_CAPSULE | ORAL | 5 refills | Status: DC

## 2016-02-02 MED ORDER — SIMVASTATIN 40 MG PO TABS: 40.0000 mg | ORAL_TABLET | Freq: Every day | ORAL | 5 refills | Status: DC

## 2016-02-29 ENCOUNTER — Telehealth: Payer: Self-pay

## 2016-02-29 NOTE — Telephone Encounter (Signed)
pls verify with his mother that this is the case, if so OK to write statement, I will sign

## 2016-03-01 NOTE — Telephone Encounter (Signed)
Mother Jamie Frost([name]) confirmed need .  Letter composed

## 2016-04-20 ENCOUNTER — Ambulatory Visit (INDEPENDENT_AMBULATORY_CARE_PROVIDER_SITE_OTHER): Payer: Medicare Other | Admitting: Family Medicine

## 2016-04-20 ENCOUNTER — Encounter: Payer: Self-pay | Admitting: Family Medicine

## 2016-04-20 VITALS — BP 116/70 | HR 86 | Resp 18 | Ht 67.0 in | Wt 262.0 lb

## 2016-04-20 DIAGNOSIS — E1169 Type 2 diabetes mellitus with other specified complication: Secondary | ICD-10-CM

## 2016-04-20 DIAGNOSIS — E785 Hyperlipidemia, unspecified: Secondary | ICD-10-CM | POA: Diagnosis not present

## 2016-04-20 DIAGNOSIS — M859 Disorder of bone density and structure, unspecified: Secondary | ICD-10-CM

## 2016-04-20 DIAGNOSIS — Z125 Encounter for screening for malignant neoplasm of prostate: Secondary | ICD-10-CM

## 2016-04-20 DIAGNOSIS — E669 Obesity, unspecified: Secondary | ICD-10-CM | POA: Diagnosis not present

## 2016-04-20 DIAGNOSIS — F411 Generalized anxiety disorder: Secondary | ICD-10-CM

## 2016-04-20 DIAGNOSIS — Q336 Congenital hypoplasia and dysplasia of lung: Secondary | ICD-10-CM

## 2016-04-20 DIAGNOSIS — I10 Essential (primary) hypertension: Secondary | ICD-10-CM

## 2016-04-20 NOTE — Patient Instructions (Addendum)
Wellness visit and MD in 3 to 4 month, call if you need me sooner   Fasting lipid, cmp and eGFr, pSA , vit D and tSH 1 week before visit if possible  Please work on good  health habits so that your health will improve. 1. Commitment to daily physical activity for 30 to 60  minutes, if you are able to do this.  2. Commitment to wise food choices. Aim for half of your  food intake to be vegetable and fruit, one quarter starchy foods, and one quarter protein. Try to eat on a regular schedule  3 meals per day, snacking between meals should be limited to vegetables or fruits or small portions of nuts. 64 ounces of water per day is generally recommended, unless you have specific health conditions, like heart failure or kidney failure where you will need to limit fluid intake.  3. Commitment to sufficient and a  good quality of physical and mental rest daily, generally between 6 to 8 hours per day.  WITH PERSISTANCE AND PERSEVERANCE, THE IMPOSSIBLE , BECOMES THE NORM! Thank you  for choosing [facility]. We consider it a privelige to serve you.  Delivering excellent health care in a caring and  compassionate way is our goal.  Partnering with you,  so that together we can achieve this goal is our strategy.

## 2016-04-21 ENCOUNTER — Encounter: Payer: Self-pay | Admitting: Family Medicine

## 2016-04-21 NOTE — Assessment & Plan Note (Signed)
Controlled, no change in medication DASH diet and commitment to daily physical activity for a minimum of 30 minutes discussed and encouraged, as a part of hypertension management. The importance of attaining a healthy weight is also discussed.  BP/Weight 04/20/2016 01/18/2016 09/15/2015 09/30/2014 05/20/2014 12/17/2013 09/10/2013  Systolic BP 116 114 114 118 118 112 126  Diastolic BP 70 78 80 80 80 80 82  Wt. (Lbs) 262 262 285 344.12 356 340 340.12  BMI 41.04 41.04 44.63 53.88 55.74 53.68 54.08

## 2016-04-21 NOTE — Assessment & Plan Note (Signed)
unchnaged Patient re-educated about  the importance of commitment to a  minimum of 150 minutes of exercise per week.  The importance of healthy food choices with portion control discussed. Encouraged to start a food diary, count calories and to consider  joining a support group. Sample diet sheets offered. Goals set by the patient for the next several months.   Weight /BMI 04/20/2016 01/18/2016 09/15/2015  WEIGHT 262 lb 262 lb 285 lb  HEIGHT 5\' 7"  5\' 7"  5\' 7"   BMI 41.04 kg/m2 41.04 kg/m2 44.63 kg/m2

## 2016-04-21 NOTE — Assessment & Plan Note (Signed)
Recent hBA1C normal Applauded on this Updated lab needed at/ before next visit.

## 2016-04-21 NOTE — Assessment & Plan Note (Signed)
Controlled, no change in medication  

## 2016-04-21 NOTE — Assessment & Plan Note (Signed)
Hyperlipidemia:Low fat diet discussed and encouraged.   Lipid Panel  Lab Results  Component Value Date   CHOL 122 09/24/2014   HDL 41 09/24/2014   LDLCALC 63 09/24/2014   TRIG 90 09/24/2014   CHOLHDL 3.0 09/24/2014   Updated lab needed at/ before next visit.    

## 2016-04-21 NOTE — Assessment & Plan Note (Signed)
Stable no recent respiratory decompensation

## 2016-04-21 NOTE — Progress Notes (Signed)
Jamie Frost     MRN: [id number]      DOB: February 10, 1976   HPI Jamie Frost is here for follow up and re-evaluation of chronic medical conditions, medication management and review of any available recent lab and radiology data.  Preventive health is updated, specifically  Cancer screening and Immunization.   Questions or concerns regarding consultations or procedures which the PT has had in the interim are  addressed. The PT denies any adverse reactions to current medications since the last visit.  There are no new concerns.  There are no specific complaints   ROS Denies recent fever or chills. Denies sinus pressure, nasal congestion, ear pain or sore throat. Denies chest congestion, productive cough or wheezing. Denies chest pains, palpitations and leg swelling Denies abdominal pain, nausea, vomiting,diarrhea or constipation.   Denies dysuria, frequency, hesitancy or incontinence. Denies joint pain, swelling and does have  limitation in mobility. Denies headaches, seizures, numbness, or tingling. Denies uncontrolled  depression, anxiety or insomnia.does have crying spells at tims when he thinks of his Mom's health Denies skin break down or rash.   PE  BP 116/70   Pulse 86   Resp 18   Ht 5\' 7"  (1.702 m)   Wt 262 lb (118.8 kg)   SpO2 97%   BMI 41.04 kg/m   Patient alert and oriented and in no cardiopulmonary distress.  HEENT: No facial asymmetry, EOMI,   oropharynx pink and moist.  Neck supple no JVD, no mass.  Chest: Clear to auscultation bilaterally.Decreased though adequate air entry  CVS: S1, S2 no murmurs, no S3.Regular rate.  ABD: Soft non tender.   Ext: No edema  MS: decreased  ROM spine, shoulders, hips and knees.  Skin: Intact, no ulcerations or rash noted.  Psych: Good eye contact, normal affect. Memory intact not anxious or depressed appearing.  CNS: CN 2-12 intact, power,  normal throughout.no focal deficits noted.   Assessment &  Plan  Essential hypertension Controlled, no change in medication DASH diet and commitment to daily physical activity for a minimum of 30 minutes discussed and encouraged, as a part of hypertension management. The importance of attaining a healthy weight is also discussed.  BP/Weight 04/20/2016 01/18/2016 09/15/2015 09/30/2014 05/20/2014 12/17/2013 09/10/2013  Systolic BP 116 114 114 118 118 112 126  Diastolic BP 70 78 80 80 80 80 82  Wt. (Lbs) 262 262 285 344.12 356 340 340.12  BMI 41.04 41.04 44.63 53.88 55.74 53.68 54.08       Diabetes mellitus type 2 in obese Recent hBA1C normal Applauded on this Updated lab needed at/ before next visit.   GENERALIZED ANXIETY DISORDER Controlled, no change in medication   Hyperlipidemia LDL goal <100 Hyperlipidemia:Low fat diet discussed and encouraged.   Lipid Panel  Lab Results  Component Value Date   CHOL 122 09/24/2014   HDL 41 09/24/2014   LDLCALC 63 09/24/2014   TRIG 90 09/24/2014   CHOLHDL 3.0 09/24/2014   Updated lab needed at/ before next visit.     Morbid obesity (HCC) unchnaged Patient re-educated about  the importance of commitment to a  minimum of 150 minutes of exercise per week.  The importance of healthy food choices with portion control discussed. Encouraged to start a food diary, count calories and to consider  joining a support group. Sample diet sheets offered. Goals set by the patient for the next several months.   Weight /BMI 04/20/2016 01/18/2016 09/15/2015  WEIGHT 262 lb 262 lb 285 lb  HEIGHT 5\' 7"  5\' 7"  5\' 7"   BMI 41.04 kg/m2 41.04 kg/m2 44.63 kg/m2      Pulmonary hypoplasia Stable no recent respiratory decompensation

## 2016-08-09 ENCOUNTER — Other Ambulatory Visit: Payer: Self-pay

## 2016-08-09 ENCOUNTER — Ambulatory Visit: Payer: Medicare Other

## 2016-08-09 ENCOUNTER — Encounter: Payer: Self-pay | Admitting: Family Medicine

## 2016-08-09 ENCOUNTER — Ambulatory Visit (INDEPENDENT_AMBULATORY_CARE_PROVIDER_SITE_OTHER): Payer: Medicare Other | Admitting: Family Medicine

## 2016-08-09 VITALS — BP 120/78 | HR 84 | Temp 97.0°F | Resp 16 | Ht 67.0 in | Wt 251.1 lb

## 2016-08-09 DIAGNOSIS — R7303 Prediabetes: Secondary | ICD-10-CM

## 2016-08-09 DIAGNOSIS — M25571 Pain in right ankle and joints of right foot: Secondary | ICD-10-CM

## 2016-08-09 DIAGNOSIS — E1169 Type 2 diabetes mellitus with other specified complication: Secondary | ICD-10-CM

## 2016-08-09 DIAGNOSIS — Z0000 Encounter for general adult medical examination without abnormal findings: Secondary | ICD-10-CM

## 2016-08-09 DIAGNOSIS — Z125 Encounter for screening for malignant neoplasm of prostate: Secondary | ICD-10-CM | POA: Diagnosis not present

## 2016-08-09 DIAGNOSIS — E669 Obesity, unspecified: Secondary | ICD-10-CM | POA: Diagnosis not present

## 2016-08-09 DIAGNOSIS — I10 Essential (primary) hypertension: Secondary | ICD-10-CM | POA: Diagnosis not present

## 2016-08-09 DIAGNOSIS — M859 Disorder of bone density and structure, unspecified: Secondary | ICD-10-CM | POA: Diagnosis not present

## 2016-08-09 DIAGNOSIS — E785 Hyperlipidemia, unspecified: Secondary | ICD-10-CM | POA: Diagnosis not present

## 2016-08-09 MED ORDER — POTASSIUM CHLORIDE CRYS ER 20 MEQ PO TBCR: EXTENDED_RELEASE_TABLET | ORAL | 1 refills | Status: DC

## 2016-08-09 MED ORDER — MOMETASONE FUROATE 50 MCG/ACT NA SUSP: 2.0000 | Freq: Every day | NASAL | 3 refills | Status: DC

## 2016-08-09 MED ORDER — SIMVASTATIN 40 MG PO TABS: 40.0000 mg | ORAL_TABLET | Freq: Every day | ORAL | 1 refills | Status: DC

## 2016-08-09 MED ORDER — VITAMIN D (ERGOCALCIFEROL) 1.25 MG (50000 UNIT) PO CAPS: ORAL_CAPSULE | ORAL | 0 refills | Status: DC

## 2016-08-09 MED ORDER — PREDNISONE 5 MG (21) PO TBPK: 5.0000 mg | ORAL_TABLET | ORAL | 0 refills | Status: DC

## 2016-08-09 MED ORDER — NAPROXEN 375 MG PO TABS: 375.0000 mg | ORAL_TABLET | Freq: Two times a day (BID) | ORAL | 0 refills | Status: DC

## 2016-08-09 MED ORDER — LORATADINE 10 MG PO TABS: ORAL_TABLET | ORAL | 1 refills | Status: DC

## 2016-08-09 MED ORDER — BENAZEPRIL HCL 10 MG PO TABS: ORAL_TABLET | ORAL | 1 refills | Status: DC

## 2016-08-09 MED ORDER — BUSPIRONE HCL 15 MG PO TABS: 7.5000 mg | ORAL_TABLET | Freq: Two times a day (BID) | ORAL | 1 refills | Status: DC

## 2016-08-09 NOTE — Progress Notes (Signed)
Preventive Screening-Counseling & Management   Patient present here today for a welcome to Medicare  wellness visit.   Current Problems (verified)   Medications Prior to Visit Allergies (verified)   PAST HISTORY  Family History  Social History   Single, no smoking or alcohol, lives with mother , whio he now helps to care for Risk Factors  Current exercise habits:  Walks 5 days per week  10 mins per day  Dietary issues discussed:Plant based  Eating, low in sugar and fried and fatty foods, has lost 100 pounds in past 24 months since his mother became ill and he started living with his Aunt    Cardiac risk factors: obesity ,hypertension and hyperlipidemia  Depression Screen  (Note: if answer to either of the following is "Yes", a more complete depression screening is indicated)   Over the past two weeks, have you felt down, depressed or hopeless? No  Over the past two weeks, have you felt little interest or pleasure in doing things? No  Have you lost interest or pleasure in daily life? No  Do you often feel hopeless? No  Do you cry easily over simple problems? No   Activities of Daily Living  In your present state of health, do you have any difficulty performing the following activities?  Driving?: unable, mild mental retardation  Managing money?: yes, incapable  Feeding yourself?:No Getting from bed to chair?:No Climbing a flight of stairs?:No Preparing food and eating?:yes , unable to prepare his own food, but can feed himself Bathing or showering?:No Getting dressed?:No Getting to the toilet?:No Using the toilet?:No Moving around from place to place?: yes x 2 weeks due to right ankle pain  Fall Risk Assessment In the past year have you fallen or had a near fall?:No Are you currently taking any medications that make you dizzy:No   Hearing Difficulties: No Do you often ask people to speak up or repeat themselves?:No Do you experience ringing or noises in your  ears?:No Do you have difficulty understanding soft or whispered voices?:No  Cognitive Testing  Alert? Yes Normal Appearance?Yes  Oriented to person? Yes Place? Yes  Time? Yes  Displays appropriate judgment?Yes  Can read the correct time from a watch face? yes Are you having problems remembering things?No  Advanced Directives have been discussed with the patient? No will defer to another visit when mother present   List the Names of Other Physician/Practitioners you currently use: none   Indicate any recent Medical Services you may have received from other than [facility] providers in the past year (date may be approximate).     Medicare Attestation  I have personally reviewed:  The patient's medical and social history  Their use of alcohol, tobacco or illicit drugs  Their current medications and supplements  The patient's functional ability including ADLs,fall risks, home safety risks, cognitive, and hearing and visual impairment  Diet and physical activities  Evidence for depression or mood disorders  The patient's weight, height, BMI, and visual acuity have been recorded in the chart. I have made referrals, counseling, and provided education to the patient based on review of the above and I have provided the patient with a written personalized care plan for preventive services.    Physical Exam BP 120/78 (BP Location: Right Arm, Patient Position: Sitting, Cuff Size: Normal)   Pulse 84   Temp 97 F (36.1 C) (Temporal)   Resp 16   Ht 5\' 7"  (1.702 m)   Wt 251 lb 1.9 oz (113.9  kg)   SpO2 97%   BMI 39.33 kg/m   MS: Decreased ROM right ankle with mild warmth and swelling and tenderness Assessment & Plan:  Welcome to Medicare preventive visit Annual exam as documented. Counseling done  re healthy lifestyle involving commitment to 150 minutes exercise per week, heart healthy diet, and attaining healthy weight.The importance of adequate sleep also discussed. Regular seat belt use  and home safety, is also discussed. Changes in health habits are decided on by the patient with goals and time frames  set for achieving them. Immunization and cancer screening needs are specifically addressed at this visit.   Ankle pain, right Two day gh/o pain , denies trauma, short course of NSAID and prednisone prescribed

## 2016-08-09 NOTE — Patient Instructions (Signed)
F/u in 4 months, same day and time as his Mom Jamie Frost  Labs today, CBC, cmp , and EGFGr, HBA1C, lipid, TSH, PsA and Vit D  Two medications are sent in for your painful right ankle to take for 1 week only, naproxen and prednisone  Thank you  for choosing [facility]. We consider it a privelige to serve you.  Delivering excellent health care in a caring and  compassionate way is our goal.  Partnering with you,  so that together we can achieve this goal is our strategy.

## 2016-08-10 ENCOUNTER — Other Ambulatory Visit: Payer: Self-pay | Admitting: Family Medicine

## 2016-08-10 LAB — COMPLETE METABOLIC PANEL WITH GFR
ALT: 15 U/L (ref 9–46)
AST: 16 U/L (ref 10–40)
Albumin: 4.1 g/dL (ref 3.6–5.1)
Alkaline Phosphatase: 87 U/L (ref 40–115)
BUN: 14 mg/dL (ref 7–25)
CHLORIDE: 103 mmol/L (ref 98–110)
CO2: 27 mmol/L (ref 20–31)
Calcium: 9.4 mg/dL (ref 8.6–10.3)
Creat: 1.1 mg/dL (ref 0.60–1.35)
GFR, EST NON AFRICAN AMERICAN: 83 mL/min (ref >=60)
GFR, Est African American: >89 mL/min (ref >=60)
GLUCOSE: 80 mg/dL (ref 65–99)
POTASSIUM: 3.9 mmol/L (ref 3.5–5.3)
SODIUM: 140 mmol/L (ref 135–146)
Total Bilirubin: 0.6 mg/dL (ref 0.2–1.2)
Total Protein: 7.8 g/dL (ref 6.1–8.1)

## 2016-08-10 LAB — VITAMIN D 25 HYDROXY (VIT D DEFICIENCY, FRACTURES): Vit D, 25-Hydroxy: 62 ng/mL (ref 30–100)

## 2016-08-10 LAB — LIPID PANEL
CHOL/HDL RATIO: 2.5 ratio (ref <5.0)
Cholesterol: 138 mg/dL (ref <200)
HDL: 56 mg/dL (ref >40)
LDL Cholesterol: 74 mg/dL (ref <100)
Triglycerides: 38 mg/dL (ref <150)
VLDL: 8 mg/dL (ref <30)

## 2016-08-10 LAB — HEMOGLOBIN A1C
Hgb A1c MFr Bld: 5.7 % — ABNORMAL HIGH (ref <5.7)
MEAN PLASMA GLUCOSE: 117 mg/dL

## 2016-08-10 LAB — PSA: PSA: 0.4 ng/mL (ref <=4.0)

## 2016-08-10 LAB — TSH: TSH: 1.43 m[IU]/L (ref 0.40–4.50)

## 2016-08-12 ENCOUNTER — Encounter: Payer: Self-pay | Admitting: Family Medicine

## 2016-08-12 DIAGNOSIS — R7303 Prediabetes: Secondary | ICD-10-CM | POA: Insufficient documentation

## 2016-08-12 DIAGNOSIS — M25571 Pain in right ankle and joints of right foot: Secondary | ICD-10-CM | POA: Insufficient documentation

## 2016-08-12 DIAGNOSIS — Z0000 Encounter for general adult medical examination without abnormal findings: Secondary | ICD-10-CM | POA: Insufficient documentation

## 2016-08-12 NOTE — Assessment & Plan Note (Signed)
Two day gh/o pain , denies trauma, short course of NSAID and prednisone prescribed

## 2016-08-12 NOTE — Assessment & Plan Note (Signed)

## 2016-12-12 ENCOUNTER — Ambulatory Visit (INDEPENDENT_AMBULATORY_CARE_PROVIDER_SITE_OTHER): Payer: Medicare Other | Admitting: Family Medicine

## 2016-12-12 ENCOUNTER — Encounter: Payer: Self-pay | Admitting: Family Medicine

## 2016-12-12 ENCOUNTER — Ambulatory Visit: Payer: Medicare Other | Admitting: Family Medicine

## 2016-12-12 VITALS — BP 120/74 | HR 71 | Temp 98.3°F | Resp 16 | Ht 67.0 in | Wt 261.0 lb

## 2016-12-12 DIAGNOSIS — F411 Generalized anxiety disorder: Secondary | ICD-10-CM | POA: Diagnosis not present

## 2016-12-12 DIAGNOSIS — R7303 Prediabetes: Secondary | ICD-10-CM

## 2016-12-12 DIAGNOSIS — I10 Essential (primary) hypertension: Secondary | ICD-10-CM | POA: Diagnosis not present

## 2016-12-12 DIAGNOSIS — Z23 Encounter for immunization: Secondary | ICD-10-CM

## 2016-12-12 DIAGNOSIS — E785 Hyperlipidemia, unspecified: Secondary | ICD-10-CM

## 2016-12-12 NOTE — Patient Instructions (Addendum)
Annual physical exam in 4 month, call if you need me sooner  Flu vaccine today  CBC, lipid, cmp and EGFR, hBA1C today   All the best for the rest of the year!  It is important that you exercise regularly at least 30 minutes 5 times a week. If you develop chest pain, have severe difficulty breathing, or feel very tired, stop exercising immediately and seek medical attention   Thank you  for choosing [facility]. We consider it a privelige to serve you.  Delivering excellent health care in a caring and  compassionate way is our goal.  Partnering with you,  so that together we can achieve this goal is our strategy.

## 2016-12-13 DIAGNOSIS — E785 Hyperlipidemia, unspecified: Secondary | ICD-10-CM | POA: Diagnosis not present

## 2016-12-13 DIAGNOSIS — I10 Essential (primary) hypertension: Secondary | ICD-10-CM | POA: Diagnosis not present

## 2016-12-13 DIAGNOSIS — R7303 Prediabetes: Secondary | ICD-10-CM | POA: Diagnosis not present

## 2016-12-13 NOTE — Assessment & Plan Note (Signed)
Deteriorated. Patient re-educated about  the importance of commitment to a  minimum of 150 minutes of exercise per week.  The importance of healthy food choices with portion control discussed. Encouraged to start a food diary, count calories and to consider  joining a support group. Sample diet sheets offered. Goals set by the patient for the next several months.   Weight /BMI 12/12/2016 08/09/2016 04/20/2016  WEIGHT 261 lb 251 lb 1.9 oz 262 lb  HEIGHT     BMI 40.88 kg/m2 39.33 kg/m2 41.04 kg/m2

## 2016-12-13 NOTE — Progress Notes (Signed)
Jamie Frost     MRN: [id number]      DOB: [birth year]   HPI Mr. Burroughs is here for follow up and re-evaluation of chronic medical conditions, medication management and review of any available recent lab and radiology data.  Preventive health is updated, specifically  Cancer screening and Immunization.   Questions or concerns regarding consultations or procedures which the PT has had in the interim are  addressed. The PT denies any adverse reactions to current medications since the last visit.  There are no new concerns.  There are no specific complaints   ROS Denies recent fever or chills. Denies sinus pressure, nasal congestion, ear pain or sore throat. Denies chest congestion, productive cough or wheezing. Denies chest pains, palpitations and leg swelling Denies abdominal pain, nausea, vomiting,diarrhea or constipation.   Denies dysuria, frequency, hesitancy or incontinence. Denies joint pain, swelling and limitation in mobility. Denies headaches, seizures, numbness, or tingling. Denies depression, anxiety or insomnia. Denies skin break down or rash.   PE  BP 120/74 (BP Location: Left Arm, Patient Position: Sitting, Cuff Size: Normal)   Pulse 71   Temp 98.3 F (36.8 C) (Other (Comment))   Resp 16   Ht  (1.702 m)   Wt 261 lb (118.4 kg)   SpO2 (!) 71%   BMI 40.88 kg/m   Patient alert and oriented and in no cardiopulmonary distress.  HEENT: No facial asymmetry, EOMI,   oropharynx pink and moist.  Neck supple no JVD, no mass.  Chest: Clear to auscultation bilaterally.  CVS: S1, S2 no murmurs, no S3.Regular rate.  ABD: Soft non tender.   Ext: No edema  MS: Adequate though reduced  ROM spine, shoulders, hips and knees.  Skin: Intact, no ulcerations or rash noted.  Psych: Good eye contact, normal affect. Memory intact not anxious or depressed appearing.  CNS: CN 2-12 intact, power,  normal throughout.no focal deficits noted.   Assessment &  Plan  Essential hypertension Controlled, no change in medication DASH diet and commitment to daily physical activity for a minimum of 30 minutes discussed and encouraged, as a part of hypertension management. The importance of attaining a healthy weight is also discussed.  BP/Weight 12/12/2016 08/09/2016 04/20/2016 01/18/2016 09/15/2015 09/30/2014 05/20/2014  Systolic BP 120 120 116 114 114 118 118  Diastolic BP 74 78 70 78 80 80 80  Wt. (Lbs) 261 251.12 262 262 285 344.12 356  BMI 40.88 39.33 41.04 41.04 44.63 53.88 55.74       GENERALIZED ANXIETY DISORDER Controlled, no change in medication   Hyperlipidemia LDL goal <100 Hyperlipidemia:Low fat diet discussed and encouraged.   Lipid Panel  Lab Results  Component Value Date   CHOL 138 08/09/2016   HDL 56 08/09/2016   LDLCALC 74 08/09/2016   TRIG 38 08/09/2016   CHOLHDL 2.5 08/09/2016     Updated lab needed    Morbid obesity (HCC) Deteriorated. Patient re-educated about  the importance of commitment to a  minimum of 150 minutes of exercise per week.  The importance of healthy food choices with portion control discussed. Encouraged to start a food diary, count calories and to consider  joining a support group. Sample diet sheets offered. Goals set by the patient for the next several months.   Weight /BMI 12/12/2016 08/09/2016 04/20/2016  WEIGHT 261 lb 251 lb 1.9 oz 262 lb  HEIGHT     BMI 40.88 kg/m2 39.33 kg/m2 41.04 kg/m2  Prediabetes Patient educated about the importance of limiting  Carbohydrate intake , the need to commit to daily physical activity for a minimum of 30 minutes , and to commit weight loss. The fact that changes in all these areas will reduce or eliminate all together the development of diabetes is stressed.   Diabetic Labs Latest Ref Rng & Units 08/09/2016 01/18/2016 09/15/2015 09/24/2014 05/15/2014  HbA1c <5.7 % 5.7(H) 5.6 - 6.2(H) 6.3(H)  Microalbumin Not Estab. ug/mL - - 15.5(H) - -   Micro/Creat Ratio 0.0 - 30.0 mg/g creat - - 4.4 - -  Chol <200 mg/dL 660 - - 630 -  HDL >16 mg/dL 56 - - 41 -  Calc LDL <010 mg/dL 74 - - 63 -  Triglycerides <150 mg/dL 38 - - 90 -  Creatinine 0.60 - 1.35 mg/dL 9.32 3.55 - 7.32 2.02   BP/Weight 12/12/2016 08/09/2016 04/20/2016 01/18/2016 09/15/2015 09/30/2014 05/20/2014  Systolic BP 120 120 116 114 114 118 118  Diastolic BP 74 78 70 78 80 80 80  Wt. (Lbs) 261 251.12 262 262 285 344.12 356  BMI 40.88 39.33 41.04 41.04 44.63 53.88 55.74   Foot/eye exam completion dates 01/18/2016 09/30/2014  Foot Form Completion Done Done   Updated lab needed

## 2016-12-13 NOTE — Assessment & Plan Note (Signed)
Controlled, no change in medication  

## 2016-12-13 NOTE — Assessment & Plan Note (Signed)
Hyperlipidemia:Low fat diet discussed and encouraged.   Lipid Panel  Lab Results  Component Value Date   CHOL 138 08/09/2016   HDL 56 08/09/2016   LDLCALC 74 08/09/2016   TRIG 38 08/09/2016   CHOLHDL 2.5 08/09/2016     Updated lab needed

## 2016-12-13 NOTE — Assessment & Plan Note (Signed)
Controlled, no change in medication DASH diet and commitment to daily physical activity for a minimum of 30 minutes discussed and encouraged, as a part of hypertension management. The importance of attaining a healthy weight is also discussed.  BP/Weight 12/12/2016 08/09/2016 04/20/2016 01/18/2016 09/15/2015 09/30/2014 05/20/2014  Systolic BP 120 120 116 114 114 118 118  Diastolic BP 74 78 70 78 80 80 80  Wt. (Lbs) 261 251.12 262 262 285 344.12 356  BMI 40.88 39.33 41.04 41.04 44.63 53.88 55.74

## 2016-12-13 NOTE — Assessment & Plan Note (Signed)
Patient educated about the importance of limiting  Carbohydrate intake , the need to commit to daily physical activity for a minimum of 30 minutes , and to commit weight loss. The fact that changes in all these areas will reduce or eliminate all together the development of diabetes is stressed.   Diabetic Labs Latest Ref Rng & Units 08/09/2016 01/18/2016 09/15/2015 09/24/2014 05/15/2014  HbA1c <5.7 % 5.7(H) 5.6 - 6.2(H) 6.3(H)  Microalbumin Not Estab. ug/mL - - 15.5(H) - -  Micro/Creat Ratio 0.0 - 30.0 mg/g creat - - 4.4 - -  Chol <200 mg/dL 454 - - 098 -  HDL >11 mg/dL 56 - - 41 -  Calc LDL <914 mg/dL 74 - - 63 -  Triglycerides <150 mg/dL 38 - - 90 -  Creatinine 0.60 - 1.35 mg/dL 7.82 9.56 - 2.13 0.86   BP/Weight 12/12/2016 08/09/2016 04/20/2016 01/18/2016 09/15/2015 09/30/2014 05/20/2014  Systolic BP 120 120 116 114 114 118 118  Diastolic BP 74 78 70 78 80 80 80  Wt. (Lbs) 261 251.12 262 262 285 344.12 356  BMI 40.88 39.33 41.04 41.04 44.63 53.88 55.74   Foot/eye exam completion dates 01/18/2016 09/30/2014  Foot Form Completion Done Done   Updated lab needed

## 2016-12-14 LAB — COMPLETE METABOLIC PANEL WITH GFR
AG Ratio: 1.1 (calc) (ref 1.0–2.5)
ALKALINE PHOSPHATASE (APISO): 91 U/L (ref 40–115)
ALT: 19 U/L (ref 9–46)
AST: 22 U/L (ref 10–40)
Albumin: 4.1 g/dL (ref 3.6–5.1)
BUN: 14 mg/dL (ref 7–25)
CALCIUM: 9.5 mg/dL (ref 8.6–10.3)
CO2: 27 mmol/L (ref 20–32)
CREATININE: 1.1 mg/dL (ref 0.60–1.35)
Chloride: 99 mmol/L (ref 98–110)
GFR, EST NON AFRICAN AMERICAN: 82 mL/min/{1.73_m2} (ref >=60)
GFR, Est African American: 95 mL/min/{1.73_m2} (ref >=60)
Globulin: 3.6 g/dL (calc) (ref 1.9–3.7)
Glucose, Bld: 88 mg/dL (ref 65–139)
Potassium: 4.9 mmol/L (ref 3.5–5.3)
Sodium: 135 mmol/L (ref 135–146)
Total Bilirubin: 0.7 mg/dL (ref 0.2–1.2)
Total Protein: 7.7 g/dL (ref 6.1–8.1)

## 2016-12-14 LAB — CBC
HCT: 40.4 % (ref 38.5–50.0)
Hemoglobin: 12.6 g/dL — ABNORMAL LOW (ref 13.2–17.1)
MCH: 25.9 pg — AB (ref 27.0–33.0)
MCHC: 31.2 g/dL — AB (ref 32.0–36.0)
MCV: 83 fL (ref 80.0–100.0)
MPV: 10.8 fL (ref 7.5–12.5)
Platelets: 286 10*3/uL (ref 140–400)
RBC: 4.87 10*6/uL (ref 4.20–5.80)
RDW: 13.7 % (ref 11.0–15.0)
WBC: 8.3 10*3/uL (ref 3.8–10.8)

## 2016-12-14 LAB — LIPID PANEL
CHOL/HDL RATIO: 2.5 (calc) (ref <5.0)
Cholesterol: 135 mg/dL (ref <200)
HDL: 55 mg/dL (ref >40)
LDL Cholesterol (Calc): 67 mg/dL (calc)
Non-HDL Cholesterol (Calc): 80 mg/dL (calc) (ref <130)
Triglycerides: 51 mg/dL (ref <150)

## 2016-12-14 LAB — HEMOGLOBIN A1C
EAG (MMOL/L): 6.3 (calc)
HEMOGLOBIN A1C: 5.6 %{Hb} (ref <5.7)
MEAN PLASMA GLUCOSE: 114 (calc)

## 2017-03-22 ENCOUNTER — Other Ambulatory Visit: Payer: Self-pay

## 2017-03-22 MED ORDER — BUSPIRONE HCL 15 MG PO TABS: 7.5000 mg | ORAL_TABLET | Freq: Two times a day (BID) | ORAL | 1 refills | Status: DC

## 2017-03-22 MED ORDER — POTASSIUM CHLORIDE CRYS ER 20 MEQ PO TBCR: EXTENDED_RELEASE_TABLET | ORAL | 1 refills | Status: DC

## 2017-03-22 MED ORDER — LORATADINE 10 MG PO TABS: ORAL_TABLET | ORAL | 1 refills | Status: DC

## 2017-04-16 ENCOUNTER — Ambulatory Visit (INDEPENDENT_AMBULATORY_CARE_PROVIDER_SITE_OTHER): Payer: Medicare Other | Admitting: Family Medicine

## 2017-04-16 ENCOUNTER — Encounter: Payer: Self-pay | Admitting: Family Medicine

## 2017-04-16 ENCOUNTER — Other Ambulatory Visit: Payer: Self-pay | Admitting: Family Medicine

## 2017-04-16 VITALS — BP 118/74 | HR 71 | Resp 16 | Ht 67.0 in | Wt 275.0 lb

## 2017-04-16 DIAGNOSIS — R7303 Prediabetes: Secondary | ICD-10-CM

## 2017-04-16 DIAGNOSIS — E785 Hyperlipidemia, unspecified: Secondary | ICD-10-CM | POA: Diagnosis not present

## 2017-04-16 DIAGNOSIS — J301 Allergic rhinitis due to pollen: Secondary | ICD-10-CM | POA: Diagnosis not present

## 2017-04-16 DIAGNOSIS — I10 Essential (primary) hypertension: Secondary | ICD-10-CM

## 2017-04-16 DIAGNOSIS — F411 Generalized anxiety disorder: Secondary | ICD-10-CM

## 2017-04-16 MED ORDER — BENAZEPRIL HCL 10 MG PO TABS: 10.0000 mg | ORAL_TABLET | Freq: Every day | ORAL | 3 refills | Status: DC

## 2017-04-16 MED ORDER — SIMVASTATIN 40 MG PO TABS: 40.0000 mg | ORAL_TABLET | Freq: Every day | ORAL | 3 refills | Status: DC

## 2017-04-16 MED ORDER — MOMETASONE FUROATE 50 MCG/ACT NA SUSP: 2.0000 | Freq: Every day | NASAL | 12 refills | Status: DC

## 2017-04-16 NOTE — Patient Instructions (Addendum)
Wellness with nurse in  [facility] June   F/U with mD in early October, call if you need me before, rectal exam at that visit  Fasting lipid, cmp and EGFr, Vit D, PSA , CBC and TSH end September , call in August for lab sheet, or ask for labs in June  No more open shoes in the home, so no falls!  Please lose your 15 pounds again!

## 2017-04-17 ENCOUNTER — Other Ambulatory Visit: Payer: Self-pay

## 2017-04-17 ENCOUNTER — Encounter: Payer: Self-pay | Admitting: Family Medicine

## 2017-04-17 NOTE — Assessment & Plan Note (Signed)
Controlled, no change in medication DASH diet and commitment to daily physical activity for a minimum of 30 minutes discussed and encouraged, as a part of hypertension management. The importance of attaining a healthy weight is also discussed.  BP/Weight 04/16/2017 12/12/2016 08/09/2016 04/20/2016 01/18/2016 09/15/2015 09/30/2014  Systolic BP 118 120 120 116 114 114 118  Diastolic BP 74 74 78 70 78 80 80  Wt. (Lbs) 275 261 251.12 262 262 285 344.12  BMI 43.07 40.88 39.33 41.04 41.04 44.63 53.88

## 2017-04-17 NOTE — Assessment & Plan Note (Signed)
Controlled, no change in medication  

## 2017-04-17 NOTE — Assessment & Plan Note (Signed)
Hyperlipidemia:Low fat diet discussed and encouraged.   Lipid Panel  Lab Results  Component Value Date   CHOL 135 12/13/2016   HDL 55 12/13/2016   LDLCALC 74 08/09/2016   TRIG 51 12/13/2016   CHOLHDL 2.5 12/13/2016   Updated lab needed at/ before next visit.

## 2017-04-17 NOTE — Assessment & Plan Note (Signed)
Patient educated about the importance of limiting  Carbohydrate intake , the need to commit to daily physical activity for a minimum of 30 minutes , and to commit weight loss. The fact that changes in all these areas will reduce or eliminate all together the development of diabetes is stressed.  Updated lab needed at/ before next visit.   Diabetic Labs Latest Ref Rng & Units 12/13/2016 08/09/2016 01/18/2016 09/15/2015 09/24/2014  HbA1c <5.7 % of total Hgb 5.6 5.7(H) 5.6 - 6.2(H)  Microalbumin Not Estab. ug/mL - - - 15.5(H) -  Micro/Creat Ratio 0.0 - 30.0 mg/g creat - - - 4.4 -  Chol <200 mg/dL 086135 578138 - - 469122  HDL >62>40 mg/dL 55 56 - - 41  Calc LDL <100 mg/dL - 74 - - 63  Triglycerides <150 mg/dL 51 38 - - 90  Creatinine 0.60 - 1.35 mg/dL 9.521.10 8.411.10 3.240.92 - 4.011.05   BP/Weight 04/16/2017 12/12/2016 08/09/2016 04/20/2016 01/18/2016 09/15/2015 09/30/2014  Systolic BP 118 120 120 116 114 114 118  Diastolic BP 74 74 78 70 78 80 80  Wt. (Lbs) 275 261 251.12 262 262 285 344.12  BMI 43.07 40.88 39.33 41.04 41.04 44.63 53.88   Foot/eye exam completion dates 01/18/2016 09/30/2014  Foot Form Completion Done Done

## 2017-04-17 NOTE — Assessment & Plan Note (Signed)
Deteriorated. Patient re-educated about  the importance of commitment to a  minimum of 150 minutes of exercise per week.  The importance of healthy food choices with portion control discussed. Encouraged to start a food diary, count calories and to consider  joining a support group. Sample diet sheets offered. Goals set by the patient for the next several months.   Weight /BMI 04/16/2017 12/12/2016 08/09/2016  WEIGHT 275 lb 261 lb 251 lb 1.9 oz  HEIGHT 5\' 7"  5\' 7"  5\' 7"   BMI 43.07 kg/m2 40.88 kg/m2 39.33 kg/m2

## 2017-04-17 NOTE — Progress Notes (Signed)
Jamie Frost     MRN: [id number]      DOB: 1975/10/05   HPI Mr. Mose is here for follow up and re-evaluation of chronic medical conditions, medication management and review of any available recent lab and radiology data.  Preventive health is updated, specifically   Immunization.   Questions or concerns regarding consultations or procedures which the PT has had in the interim are  addressed. The PT denies any adverse reactions to current medications since the last visit.  There are no new concerns.  There are no specific complaints   ROS Denies recent fever or chills. Denies sinus pressure, nasal congestion, ear pain or sore throat. Denies chest congestion, productive cough or wheezing. Denies chest pains, palpitations and leg swelling Denies abdominal pain, nausea, vomiting,diarrhea or constipation.   Denies dysuria, frequency, hesitancy or incontinence. Denies joint pain, swelling and limitation in mobility. Denies headaches, seizures, numbness, or tingling. Denies depression, anxiety or insomnia. Denies skin break down or rash.   PE  BP 118/74   Pulse 71   Resp 16   Ht 5\' 7"  (1.702 m)   Wt 275 lb (124.7 kg)   SpO2 96%   BMI 43.07 kg/m   Patient alert and oriented and in no cardiopulmonary distress.  HEENT: No facial asymmetry, EOMI,   oropharynx pink and moist.  Neck supple no JVD, no mass.  Chest: Clear to auscultation bilaterally.Decfreased air entry  CVS: S1, S2 no murmurs, no S3.Regular rate.  ABD: Soft non tender.   Ext: No edema  MS: Adequate though reduced  ROM spine, shoulders, hips and knees.  Skin: Intact, no ulcerations or rash noted.  Psych: Good eye contact, normal affect. Memory intact not anxious or depressed appearing.  CNS: CN 2-12 intact, power,  normal throughout.no focal deficits noted.   Assessment & Plan   Essential hypertension Controlled, no change in medication DASH diet and commitment to daily physical activity for  a minimum of 30 minutes discussed and encouraged, as a part of hypertension management. The importance of attaining a healthy weight is also discussed.  BP/Weight 04/16/2017 12/12/2016 08/09/2016 04/20/2016 01/18/2016 09/15/2015 09/30/2014  Systolic BP 118 120 120 116 114 114 118  Diastolic BP 74 74 78 70 78 80 80  Wt. (Lbs) 275 261 251.12 262 262 285 344.12  BMI 43.07 40.88 39.33 41.04 41.04 44.63 53.88       Hyperlipidemia LDL goal <100 Hyperlipidemia:Low fat diet discussed and encouraged.   Lipid Panel  Lab Results  Component Value Date   CHOL 135 12/13/2016   HDL 55 12/13/2016   LDLCALC 74 08/09/2016   TRIG 51 12/13/2016   CHOLHDL 2.5 12/13/2016   Updated lab needed at/ before next visit.     Morbid obesity (HCC) Deteriorated. Patient re-educated about  the importance of commitment to a  minimum of 150 minutes of exercise per week.  The importance of healthy food choices with portion control discussed. Encouraged to start a food diary, count calories and to consider  joining a support group. Sample diet sheets offered. Goals set by the patient for the next several months.   Weight /BMI 04/16/2017 12/12/2016 08/09/2016  WEIGHT 275 lb 261 lb 251 lb 1.9 oz  HEIGHT 5\' 7"  5\' 7"  5\' 7"   BMI 43.07 kg/m2 40.88 kg/m2 39.33 kg/m2      ALLERGIC RHINITIS, SEASONAL Controlled, no change in medication   GENERALIZED ANXIETY DISORDER Controlled, no change in medication   Prediabetes Patient educated about the importance  of limiting  Carbohydrate intake , the need to commit to daily physical activity for a minimum of 30 minutes , and to commit weight loss. The fact that changes in all these areas will reduce or eliminate all together the development of diabetes is stressed.  Updated lab needed at/ before next visit.   Diabetic Labs Latest Ref Rng & Units 12/13/2016 08/09/2016 01/18/2016 09/15/2015 09/24/2014  HbA1c <5.7 % of total Hgb 5.6 5.7(H) 5.6 - 6.2(H)  Microalbumin Not Estab.  ug/mL - - - 15.5(H) -  Micro/Creat Ratio 0.0 - 30.0 mg/g creat - - - 4.4 -  Chol <200 mg/dL 829135 562138 - - 130122  HDL >86>40 mg/dL 55 56 - - 41  Calc LDL <100 mg/dL - 74 - - 63  Triglycerides <150 mg/dL 51 38 - - 90  Creatinine 0.60 - 1.35 mg/dL 5.781.10 4.691.10 6.290.92 - 5.281.05   BP/Weight 04/16/2017 12/12/2016 08/09/2016 04/20/2016 01/18/2016 09/15/2015 09/30/2014  Systolic BP 118 120 120 116 114 114 118  Diastolic BP 74 74 78 70 78 80 80  Wt. (Lbs) 275 261 251.12 262 262 285 344.12  BMI 43.07 40.88 39.33 41.04 41.04 44.63 53.88   Foot/eye exam completion dates 01/18/2016 09/30/2014  Foot Form Completion Done Done

## 2017-05-11 ENCOUNTER — Other Ambulatory Visit: Payer: Self-pay | Admitting: Family Medicine

## 2017-07-03 ENCOUNTER — Other Ambulatory Visit: Payer: Self-pay | Admitting: Family Medicine

## 2017-08-20 ENCOUNTER — Ambulatory Visit (INDEPENDENT_AMBULATORY_CARE_PROVIDER_SITE_OTHER): Payer: Medicare Other

## 2017-08-20 ENCOUNTER — Encounter (INDEPENDENT_AMBULATORY_CARE_PROVIDER_SITE_OTHER): Payer: Self-pay

## 2017-08-20 VITALS — BP 112/78 | HR 88 | Temp 98.7°F | Resp 18 | Ht 67.5 in | Wt 283.1 lb

## 2017-08-20 DIAGNOSIS — Z1159 Encounter for screening for other viral diseases: Secondary | ICD-10-CM | POA: Diagnosis not present

## 2017-08-20 DIAGNOSIS — Z0000 Encounter for general adult medical examination without abnormal findings: Secondary | ICD-10-CM

## 2017-08-20 NOTE — Progress Notes (Signed)
Subjective:   Jamie Frost is a 42 y.o. male who presents for Medicare Annual/Subsequent preventive examination.  Review of Systems:   Cardiac Risk Factors include: male gender     Objective:    Vitals: BP 112/78 (BP Location: Left Arm, Patient Position: Sitting, Cuff Size: Large)   Pulse 88   Temp 98.7 F (37.1 C) (Temporal)   Resp 18   Ht 5' 7.5" (1.715 m)   Wt 283 lb 1.9 oz (128.4 kg)   SpO2 97%   BMI 43.69 kg/m   Body mass index is 43.69 kg/m.  Advanced Directives 08/20/2017  Does Patient Have a Medical Advance Directive? Yes  Type of Advance Directive (No Data)  Does patient want to make changes to medical advance directive? No - Patient declined    Tobacco Social History   Tobacco Use  Smoking Status Never Smoker  Smokeless Tobacco Never Used     Counseling given: Yes   Clinical Intake:  Pre-visit preparation completed: Yes  Pain : No/denies pain     BMI - recorded: 43.7 Nutritional Status: BMI > 30  Obese Diabetes: No  How often do you need to have someone help you when you read instructions, pamphlets, or other written materials from your doctor or pharmacy?: 1 - Never What is the last grade level you completed in school?: 12  Interpreter Needed?: No  Information entered by :: Abby S  Past Medical History:  Diagnosis Date  . Allergic rhinitis, seasonal   . Diabetes mellitus without complication (HCC)   . Hyperlipidemia   . Hypertension   . Impaired fasting glucose 12/09/2009  . Obesity   . Pulmonary hypoplasia    Past Surgical History:  Procedure Laterality Date  . bilateral inguinal hernia    . I/D abcess on back  2007   1 WEEK   . right great toe surgery     Family History  Problem Relation Age of Onset  . Obesity Mother   . Hypertension Mother   . Arthritis Mother    Social History   Socioeconomic History  . Marital status: Single    Spouse name: Not on file  . Number of children: Not on file  . Years of  education: Not on file  . Highest education level: Not on file  Occupational History  . Not on file  Social Needs  . Financial resource strain: Not hard at all  . Food insecurity:    Worry: Never true    Inability: Never true  . Transportation needs:    Medical: No    Non-medical: No  Tobacco Use  . Smoking status: Never Smoker  . Smokeless tobacco: Never Used  Substance and Sexual Activity  . Alcohol use: No  . Drug use: No  . Sexual activity: Never  Lifestyle  . Physical activity:    Days per week: 2 days    Minutes per session: 10 min  . Stress: Not at all  Relationships  . Social connections:    Talks on phone: Twice a week    Gets together: Twice a week    Attends religious service: Never    Active member of club or organization: No    Attends meetings of clubs or organizations: Never    Relationship status: Never married  Other Topics Concern  . Not on file  Social History Narrative   He enjoys reading the bible during his free time.     Outpatient Encounter Medications as of 08/20/2017  Medication Sig  . albuterol (PROVENTIL) (2.5 MG/3ML) 0.083% nebulizer solution Take 3 mLs (2.5 mg total) by nebulization every 6 (six) hours as needed. One vial per nebulizer every 6 to 8 hours prn  . benazepril (LOTENSIN) 10 MG tablet Take 1 tablet (10 mg total) by mouth daily.  . busPIRone (BUSPAR) 15 MG tablet TAKE 1/2 TABLET TWICE DAILY  . Elite Nebulizer System MISC by Does not apply route.    . fluticasone (FLONASE) 50 MCG/ACT nasal spray USE 2 SPRAYS IN EACH NOSTRIL DAILY  . loratadine (CLARITIN) 10 MG tablet TAKE ONE (1) TABLET EACH DAY  . potassium chloride SA (K-DUR,KLOR-CON) 20 MEQ tablet TAKE ONE (1) TABLET EACH DAY  . simvastatin (ZOCOR) 40 MG tablet Take 1 tablet (40 mg total) by mouth at bedtime.   No facility-administered encounter medications on file as of 08/20/2017.     Activities of Daily Living In your present state of health, do you have any difficulty  performing the following activities: 08/20/2017  Hearing? N  Vision? N  Difficulty concentrating or making decisions? N  Walking or climbing stairs? N  Dressing or bathing? N  Doing errands, shopping? N  Preparing Food and eating ? N  Using the Toilet? N  In the past six months, have you accidently leaked urine? N  Do you have problems with loss of bowel control? N  Managing your Medications? Y  Comment Aunt helps patient  Managing your Finances? Y  Comment Aunt helps patient  Housekeeping or managing your Housekeeping? Y  Comment Aunt helps patient  Some recent data might be hidden    Patient Care Team: Kerri Perches, MD as PCP - General   Assessment:   This is a routine wellness examination for Jamie Frost.  Exercise Activities and Dietary recommendations Current Exercise Habits: The patient does not participate in regular exercise at present, Exercise limited by: None identified  Goals    None      Fall Risk Fall Risk  08/20/2017 05/20/2014 12/16/2012  Falls in the past year? No No No   Is the patient's home free of loose throw rugs in walkways, pet beds, electrical cords, etc?   yes      Grab bars in the bathroom? no      Handrails on the stairs?   no      Adequate lighting?   yes  Timed Get Up and Go Performed: No  Depression Screen PHQ 2/9 Scores 08/20/2017 08/09/2016 09/15/2015 05/20/2014  PHQ - 2 Score 0 0 0 0  PHQ- 9 Score - - 0 0    Cognitive Function MMSE - Mini Mental State Exam 08/20/2017  Orientation to time 5  Orientation to Place 5  Registration 3  Attention/ Calculation 5  Recall 2  Language- name 2 objects 2  Language- repeat 1  Language- follow 3 step command 3  Language- read & follow direction 1  Write a sentence 1  Copy design 0  Total score 28     6CIT Screen 08/20/2017  What Year? 0 points  What month? 0 points  What time? 0 points  Count back from 20 4 points  Months in reverse 4 points  Repeat phrase 4 points  Total Score 12      Immunization History  Administered Date(s) Administered  . Influenza Split 12/30/2010, 11/29/2011  . Influenza Whole 01/19/2007, 12/24/2008, 11/19/2009  . Influenza,inj,Quad PF,6+ Mos 12/16/2012, 12/17/2013, 12/21/2014, 12/20/2015, 12/12/2016  . Pneumococcal Polysaccharide-23 07/01/2010, 09/15/2015  . Td 12/10/2002  .  Tdap 12/30/2010    Qualifies for Shingles Vaccine? No  Screening Tests Health Maintenance  Topic Date Due  . HEMOGLOBIN A1C  06/13/2017  . OPHTHALMOLOGY EXAM  09/20/2017 (Originally 11/14/1984)  . FOOT EXAM  05/23/2018 (Originally 01/23/2017)  . INFLUENZA VACCINE  10/04/2017  . TETANUS/TDAP  12/29/2020  . PNEUMOCOCCAL POLYSACCHARIDE VACCINE  Completed  . HIV Screening  Completed   Cancer Screenings: Lung: Low Dose CT Chest recommended if Age 23-80 years, 30 pack-year currently smoking OR have quit w/in 15years. Patient does not qualify. Colorectal: Patient isn't old enough for colonoscopy  Additional Screenings: Hepatitis C Screening:Patient will have this drawn today      Plan:     I have personally reviewed and noted the following in the patient's chart:   . Medical and social history . Use of alcohol, tobacco or illicit drugs  . Current medications and supplements . Functional ability and status . Nutritional status . Physical activity . Advanced directives . List of other physicians . Hospitalizations, surgeries, and ER visits in previous 12 months . Vitals . Screenings to include cognitive, depression, and falls . Referrals and appointments  In addition, I have reviewed and discussed with patient certain preventive protocols, quality metrics, and best practice recommendations. A written personalized care plan for preventive services as well as general preventive health recommendations were provided to patient.     Edwena BundeMarian A Sanchez, CMA  08/20/2017

## 2017-08-20 NOTE — Patient Instructions (Addendum)
Mr. Jamie Frost , Thank you for taking time to come for your Medicare Wellness Visit. I appreciate your ongoing commitment to your health goals. Please review the following plan we discussed and let me know if I can assist you in the future.   Screening recommendations/referrals: Colonoscopy: You aren't old enough to start having these yet Recommended yearly ophthalmology/optometry visit for glaucoma screening and checkup Recommended yearly dental visit for hygiene and checkup  Vaccinations: Influenza vaccine: Due in the Fall of 2019 Pneumococcal vaccine: UTD Tdap vaccine: UTD Shingles vaccine: Call your insurance company and see if this is a covered vaccine.     Advanced directives: You already have these in place. Your aunt is aware of your wishes.  Conditions/risks identified: Discussed during your visit.   Next appointment: December 11, 2017 at 10:20 am with Dr. Lodema Hong. Please don't hesitate to call us if you need something before then.   PLEASE GO TO THE LAB AND HAVE YOUR BLOOD WORK DRAWN. IT WAS A PLEASURE MEETING YOU TODAY!!   Preventive Care 59-64 Years, Male Preventive care refers to lifestyle choices and visits with your health care provider that can promote health and wellness. What does preventive care include?  A yearly physical exam. This is also called an annual well check.  Dental exams once or twice a year.  Routine eye exams. Ask your health care provider how often you should have your eyes checked.  Personal lifestyle choices, including:  Daily care of your teeth and gums.  Regular physical activity.  Eating a healthy diet.  Avoiding tobacco and drug use.  Limiting alcohol use.  Practicing safe sex.  Taking low-dose aspirin every day starting at age 35. What happens during an annual well check? The services and screenings done by your health care provider during your annual well check will depend on your age, overall health, lifestyle risk factors, and  family history of disease. Counseling  Your health care provider may ask you questions about your:  Alcohol use.  Tobacco use.  Drug use.  Emotional well-being.  Home and relationship well-being.  Sexual activity.  Eating habits.  Work and work Astronomer. Screening  You may have the following tests or measurements:  Height, weight, and BMI.  Blood pressure.  Lipid and cholesterol levels. These may be checked every 5 years, or more frequently if you are over 67 years old.  Skin check.  Lung cancer screening. You may have this screening every year starting at age 67 if you have a 30-pack-year history of smoking and currently smoke or have quit within the past 15 years.  Fecal occult blood test (FOBT) of the stool. You may have this test every year starting at age 21.  Flexible sigmoidoscopy or colonoscopy. You may have a sigmoidoscopy every 5 years or a colonoscopy every 10 years starting at age 40.  Prostate cancer screening. Recommendations will vary depending on your family history and other risks.  Hepatitis C blood test.  Hepatitis B blood test.  Sexually transmitted disease (STD) testing.  Diabetes screening. This is done by checking your blood sugar (glucose) after you have not eaten for a while (fasting). You may have this done every 1-3 years. Discuss your test results, treatment options, and if necessary, the need for more tests with your health care provider. Vaccines  Your health care provider may recommend certain vaccines, such as:  Influenza vaccine. This is recommended every year.  Tetanus, diphtheria, and acellular pertussis (Tdap, Td) vaccine. You may need a  Td booster every 10 years.  Zoster vaccine. You may need this after age 26.  Pneumococcal 13-valent conjugate (PCV13) vaccine. You may need this if you have certain conditions and have not been vaccinated.  Pneumococcal polysaccharide (PPSV23) vaccine. You may need one or two doses if you  smoke cigarettes or if you have certain conditions. Talk to your health care provider about which screenings and vaccines you need and how often you need them. This information is not intended to replace advice given to you by your health care provider. Make sure you discuss any questions you have with your health care provider. Document Released: 03/19/2015 Document Revised: 11/10/2015 Document Reviewed: 12/22/2014 Elsevier Interactive Patient Education  2017 [employer].  Fall Prevention in the Home Falls can cause injuries. They can happen to people of all ages. There are many things you can do to make your home safe and to help prevent falls. What can I do on the outside of my home?  Regularly fix the edges of walkways and driveways and fix any cracks.  Remove anything that might make you trip as you walk through a door, such as a raised step or threshold.  Trim any bushes or trees on the path to your home.  Use bright outdoor lighting.  Clear any walking paths of anything that might make someone trip, such as rocks or tools.  Regularly check to see if handrails are loose or broken. Make sure that both sides of any steps have handrails.  Any raised decks and porches should have guardrails on the edges.  Have any leaves, snow, or ice cleared regularly.  Use sand or salt on walking paths during winter.  Clean up any spills in your garage right away. This includes oil or grease spills. What can I do in the bathroom?  Use night lights.  Install grab bars by the toilet and in the tub and shower. Do not use towel bars as grab bars.  Use non-skid mats or decals in the tub or shower.  If you need to sit down in the shower, use a plastic, non-slip stool.  Keep the floor dry. Clean up any water that spills on the floor as soon as it happens.  Remove soap buildup in the tub or shower regularly.  Attach bath mats securely with double-sided non-slip rug tape.  Do not have throw  rugs and other things on the floor that can make you trip. What can I do in the bedroom?  Use night lights.  Make sure that you have a light by your bed that is easy to reach.  Do not use any sheets or blankets that are too big for your bed. They should not hang down onto the floor.  Have a firm chair that has side arms. You can use this for support while you get dressed.  Do not have throw rugs and other things on the floor that can make you trip. What can I do in the kitchen?  Clean up any spills right away.  Avoid walking on wet floors.  Keep items that you use a lot in easy-to-reach places.  If you need to reach something above you, use a strong step stool that has a grab bar.  Keep electrical cords out of the way.  Do not use floor polish or wax that makes floors slippery. If you must use wax, use non-skid floor wax.  Do not have throw rugs and other things on the floor that can make you  trip. What can I do with my stairs?  Do not leave any items on the stairs.  Make sure that there are handrails on both sides of the stairs and use them. Fix handrails that are broken or loose. Make sure that handrails are as long as the stairways.  Check any carpeting to make sure that it is firmly attached to the stairs. Fix any carpet that is loose or worn.  Avoid having throw rugs at the top or bottom of the stairs. If you do have throw rugs, attach them to the floor with carpet tape.  Make sure that you have a light switch at the top of the stairs and the bottom of the stairs. If you do not have them, ask someone to add them for you. What else can I do to help prevent falls?  Wear shoes that:  Do not have high heels.  Have rubber bottoms.  Are comfortable and fit you well.  Are closed at the toe. Do not wear sandals.  If you use a stepladder:  Make sure that it is fully opened. Do not climb a closed stepladder.  Make sure that both sides of the stepladder are locked into  place.  Ask someone to hold it for you, if possible.  Clearly mark and make sure that you can see:  Any grab bars or handrails.  First and last steps.  Where the edge of each step is.  Use tools that help you move around (mobility aids) if they are needed. These include:  Canes.  Walkers.  Scooters.  Crutches.  Turn on the lights when you go into a dark area. Replace any light bulbs as soon as they burn out.  Set up your furniture so you have a clear path. Avoid moving your furniture around.  If any of your floors are uneven, fix them.  If there are any pets around you, be aware of where they are.  Review your medicines with your doctor. Some medicines can make you feel dizzy. This can increase your chance of falling. Ask your doctor what other things that you can do to help prevent falls. This information is not intended to replace advice given to you by your health care provider. Make sure you discuss any questions you have with your health care provider. Document Released: 12/17/2008 Document Revised: 07/29/2015 Document Reviewed: 03/27/2014 Elsevier Interactive Patient Education  2017 [employer]

## 2017-08-21 LAB — HEPATITIS C ANTIBODY
HEP C AB: NONREACTIVE
SIGNAL TO CUT-OFF: 0.04 (ref <1.00)

## 2017-11-02 ENCOUNTER — Other Ambulatory Visit: Payer: Self-pay | Admitting: Family Medicine

## 2017-12-11 ENCOUNTER — Encounter: Payer: Medicare Other | Admitting: Family Medicine

## 2017-12-31 ENCOUNTER — Other Ambulatory Visit: Payer: Self-pay | Admitting: Family Medicine

## 2018-01-01 ENCOUNTER — Encounter: Payer: Self-pay | Admitting: Family Medicine

## 2018-01-01 ENCOUNTER — Ambulatory Visit: Payer: Medicare Other | Admitting: Family Medicine

## 2018-01-01 VITALS — BP 118/80 | HR 83 | Resp 16 | Ht 68.0 in | Wt 288.0 lb

## 2018-01-01 DIAGNOSIS — I10 Essential (primary) hypertension: Secondary | ICD-10-CM | POA: Diagnosis not present

## 2018-01-01 DIAGNOSIS — R7303 Prediabetes: Secondary | ICD-10-CM | POA: Diagnosis not present

## 2018-01-01 DIAGNOSIS — E785 Hyperlipidemia, unspecified: Secondary | ICD-10-CM

## 2018-01-01 DIAGNOSIS — Z23 Encounter for immunization: Secondary | ICD-10-CM

## 2018-01-01 NOTE — Progress Notes (Signed)
Jamie Frost     MRN: [id number]      DOB: [birth year]   HPI Jamie Frost is here for follow up and re-evaluation of chronic medical conditions, medication management and review of any available recent lab and radiology data.  Preventive health is updated, specifically  Cancer screening and Immunization.   Questions or concerns regarding consultations or procedures which the PT has had in the interim are  addressed. The PT denies any adverse reactions to current medications since the last visit.  There are no new concerns.  There are no specific complaints   ROS Denies recent fever or chills. Denies sinus pressure, nasal congestion, ear pain or sore throat. Denies chest congestion, productive cough or wheezing. Denies chest pains, palpitations and leg swelling Denies abdominal pain, nausea, vomiting,diarrhea or constipation.   Denies dysuria, frequency, hesitancy or incontinence. Denies joint pain, swelling and limitation in mobility. Denies headaches, seizures, numbness, or tingling. Denies depression, anxiety or insomnia. Denies skin break down or rash.   PE  BP 118/80   Pulse 83   Resp 16   Ht 5\' 8"  (1.727 m)   Wt 288 lb (130.6 kg)   SpO2 98%   BMI 43.79 kg/m   Patient alert and oriented and in no cardiopulmonary distress.  HEENT: No facial asymmetry, EOMI,   oropharynx pink and moist.  Neck supple no JVD, no mass.  Chest: Clear to auscultation bilaterally., decreased though adequate air entry  CVS: S1, S2 no murmurs, no S3.Regular rate.  ABD: Soft non tender.   Ext: No edema  MS: Adequate ROM spine, shoulders, hips and knees.  Skin: Intact, no ulcerations or rash noted.  Psych: Good eye contact, normal affect. Memory intact not anxious or depressed appearing.  CNS: CN 2-12 intact, power,  normal throughout.no focal deficits noted.   Assessment & Plan  Essential hypertension Controlled, no change in medication DASH diet and commitment to daily  physical activity for a minimum of 30 minutes discussed and encouraged, as a part of hypertension management. The importance of attaining a healthy weight is also discussed.  BP/Weight 01/01/2018 08/20/2017 04/16/2017 12/12/2016 08/09/2016 04/20/2016 01/18/2016  Systolic BP 118 112 118 120 120 116 114  Diastolic BP 80 78 74 74 78 70 78  Wt. (Lbs) 288 283.12 275 261 251.12 262 262  BMI 43.79 43.69 43.07 40.88 39.33 41.04 41.04       Morbid obesity (HCC) Deteriorated. Patient re-educated about  the importance of commitment to a  minimum of 150 minutes of exercise per week.  The importance of healthy food choices with portion control discussed. Encouraged to start a food diary, count calories and to consider  joining a support group. Sample diet sheets offered. Goals set by the patient for the next several months.   Weight /BMI 01/01/2018 08/20/2017 04/16/2017  WEIGHT 288 lb 283 lb 1.9 oz 275 lb  HEIGHT 5\' 8"  5' 7.5" 5\' 7"   BMI 43.79 kg/m2 43.69 kg/m2 43.07 kg/m2      Hyperlipidemia LDL goal <100 Hyperlipidemia:Low fat diet discussed and encouraged.   Lipid Panel  Lab Results  Component Value Date   CHOL 139 01/01/2018   HDL 53 01/01/2018   LDLCALC 72 01/01/2018   TRIG 64 01/01/2018   CHOLHDL 2.6 01/01/2018   Controlled, no change in medication    Prediabetes Patient educated about the importance of limiting  Carbohydrate intake , the need to commit to daily physical activity for a minimum of 30 minutes , and  to commit weight loss. The fact that changes in all these areas will reduce or eliminate all together the development of diabetes is stressed.   deteriorated Diabetic Labs Latest Ref Rng & Units 01/01/2018 12/13/2016 08/09/2016 01/18/2016 09/15/2015  HbA1c <5.7 % of total Hgb 5.9(H) 5.6 5.7(H) 5.6 -  Microalbumin Not Estab. ug/mL - - - - 15.5(H)  Micro/Creat Ratio 0.0 - 30.0 mg/g creat - - - - 4.4  Chol <200 mg/dL 119 147 829 - -  HDL >56 mg/dL 53 55 56 - -  Calc LDL  mg/dL (calc) 72 67 74 - -  Triglycerides <150 mg/dL 64 51 38 - -  Creatinine 0.60 - 1.35 mg/dL 2.13 0.86 5.78 4.69 -   BP/Weight 01/01/2018 08/20/2017 04/16/2017 12/12/2016 08/09/2016 04/20/2016 01/18/2016  Systolic BP 118 112 118 120 120 116 114  Diastolic BP 80 78 74 74 78 70 78  Wt. (Lbs) 288 283.12 275 261 251.12 262 262  BMI 43.79 43.69 43.07 40.88 39.33 41.04 41.04   Foot/eye exam completion dates 01/18/2016 09/30/2014  Foot Form Completion Done Done

## 2018-01-01 NOTE — Patient Instructions (Signed)
F/U in 6 months, cal if you need me before   Flu vaccine today  CBC, lipid, cmp and eGFr, HBA1C and TSH today  Please stop sweet tea and sodas and juice, also cut back on cake and ice cream and cookies , candy

## 2018-01-02 ENCOUNTER — Other Ambulatory Visit: Payer: Self-pay | Admitting: Family Medicine

## 2018-01-02 LAB — COMPLETE METABOLIC PANEL WITH GFR
AG Ratio: 1.2 (calc) (ref 1.0–2.5)
ALBUMIN MSPROF: 4.3 g/dL (ref 3.6–5.1)
ALKALINE PHOSPHATASE (APISO): 95 U/L (ref 40–115)
ALT: 16 U/L (ref 9–46)
AST: 20 U/L (ref 10–40)
BUN: 14 mg/dL (ref 7–25)
CO2: 29 mmol/L (ref 20–32)
CREATININE: 1.26 mg/dL (ref 0.60–1.35)
Calcium: 9.7 mg/dL (ref 8.6–10.3)
Chloride: 101 mmol/L (ref 98–110)
GFR, Est African American: 80 mL/min/{1.73_m2} (ref >=60)
GFR, Est Non African American: 69 mL/min/{1.73_m2} (ref >=60)
GLUCOSE: 74 mg/dL (ref 65–99)
Globulin: 3.7 g/dL (calc) (ref 1.9–3.7)
Potassium: 4.8 mmol/L (ref 3.5–5.3)
Sodium: 140 mmol/L (ref 135–146)
Total Bilirubin: 0.5 mg/dL (ref 0.2–1.2)
Total Protein: 8 g/dL (ref 6.1–8.1)

## 2018-01-02 LAB — TSH: TSH: 1.06 m[IU]/L (ref 0.40–4.50)

## 2018-01-02 LAB — LIPID PANEL
CHOLESTEROL: 139 mg/dL (ref <200)
HDL: 53 mg/dL (ref >40)
LDL Cholesterol (Calc): 72 mg/dL (calc)
NON-HDL CHOLESTEROL (CALC): 86 mg/dL (ref <130)
TRIGLYCERIDES: 64 mg/dL (ref <150)
Total CHOL/HDL Ratio: 2.6 (calc) (ref <5.0)

## 2018-01-02 LAB — HEMOGLOBIN A1C
HEMOGLOBIN A1C: 5.9 %{Hb} — AB (ref <5.7)
Mean Plasma Glucose: 123 (calc)
eAG (mmol/L): 6.8 (calc)

## 2018-01-02 LAB — CBC
HCT: 42.8 % (ref 38.5–50.0)
Hemoglobin: 13.4 g/dL (ref 13.2–17.1)
MCH: 26.6 pg — AB (ref 27.0–33.0)
MCHC: 31.3 g/dL — AB (ref 32.0–36.0)
MCV: 85.1 fL (ref 80.0–100.0)
MPV: 11.3 fL (ref 7.5–12.5)
Platelets: 287 10*3/uL (ref 140–400)
RBC: 5.03 10*6/uL (ref 4.20–5.80)
RDW: 13.2 % (ref 11.0–15.0)
WBC: 11 10*3/uL — AB (ref 3.8–10.8)

## 2018-01-12 NOTE — Assessment & Plan Note (Signed)
Hyperlipidemia:Low fat diet discussed and encouraged.   Lipid Panel  Lab Results  Component Value Date   CHOL 139 01/01/2018   HDL 53 01/01/2018   LDLCALC 72 01/01/2018   TRIG 64 01/01/2018   CHOLHDL 2.6 01/01/2018   Controlled, no change in medication

## 2018-01-12 NOTE — Assessment & Plan Note (Signed)
Deteriorated. Patient re-educated about  the importance of commitment to a  minimum of 150 minutes of exercise per week.  The importance of healthy food choices with portion control discussed. Encouraged to start a food diary, count calories and to consider  joining a support group. Sample diet sheets offered. Goals set by the patient for the next several months.   Weight /BMI 01/01/2018 08/20/2017 04/16/2017  WEIGHT 288 lb 283 lb 1.9 oz 275 lb  HEIGHT 5\' 8"  5' 7.5" 5\' 7"   BMI 43.79 kg/m2 43.69 kg/m2 43.07 kg/m2

## 2018-01-12 NOTE — Assessment & Plan Note (Signed)
Patient educated about the importance of limiting  Carbohydrate intake , the need to commit to daily physical activity for a minimum of 30 minutes , and to commit weight loss. The fact that changes in all these areas will reduce or eliminate all together the development of diabetes is stressed.   deteriorated Diabetic Labs Latest Ref Rng & Units 01/01/2018 12/13/2016 08/09/2016 01/18/2016 09/15/2015  HbA1c <5.7 % of total Hgb 5.9(H) 5.6 5.7(H) 5.6 -  Microalbumin Not Estab. ug/mL - - - - 15.5(H)  Micro/Creat Ratio 0.0 - 30.0 mg/g creat - - - - 4.4  Chol <200 mg/dL 161 096 045 - -  HDL >40 mg/dL 53 55 56 - -  Calc LDL mg/dL (calc) 72 67 74 - -  Triglycerides <150 mg/dL 64 51 38 - -  Creatinine 0.60 - 1.35 mg/dL 9.81 1.91 4.78 2.95 -   BP/Weight 01/01/2018 08/20/2017 04/16/2017 12/12/2016 08/09/2016 04/20/2016 01/18/2016  Systolic BP 118 112 118 120 120 116 114  Diastolic BP 80 78 74 74 78 70 78  Wt. (Lbs) 288 283.12 275 261 251.12 262 262  BMI 43.79 43.69 43.07 40.88 39.33 41.04 41.04   Foot/eye exam completion dates 01/18/2016 09/30/2014  Foot Form Completion Done Done

## 2018-01-12 NOTE — Assessment & Plan Note (Signed)
Controlled, no change in medication DASH diet and commitment to daily physical activity for a minimum of 30 minutes discussed and encouraged, as a part of hypertension management. The importance of attaining a healthy weight is also discussed.  BP/Weight 01/01/2018 08/20/2017 04/16/2017 12/12/2016 08/09/2016 04/20/2016 01/18/2016  Systolic BP 118 112 118 120 120 116 114  Diastolic BP 80 78 74 74 78 70 78  Wt. (Lbs) 288 283.12 275 261 251.12 262 262  BMI 43.79 43.69 43.07 40.88 39.33 41.04 41.04

## 2018-07-03 ENCOUNTER — Ambulatory Visit (INDEPENDENT_AMBULATORY_CARE_PROVIDER_SITE_OTHER): Payer: Medicare Other | Admitting: Family Medicine

## 2018-07-03 ENCOUNTER — Other Ambulatory Visit: Payer: Self-pay

## 2018-07-03 ENCOUNTER — Encounter: Payer: Self-pay | Admitting: Family Medicine

## 2018-07-03 VITALS — Wt 267.0 lb

## 2018-07-03 DIAGNOSIS — I10 Essential (primary) hypertension: Secondary | ICD-10-CM | POA: Diagnosis not present

## 2018-07-03 DIAGNOSIS — E119 Type 2 diabetes mellitus without complications: Secondary | ICD-10-CM

## 2018-07-03 DIAGNOSIS — E669 Obesity, unspecified: Secondary | ICD-10-CM

## 2018-07-03 DIAGNOSIS — E1169 Type 2 diabetes mellitus with other specified complication: Secondary | ICD-10-CM

## 2018-07-03 DIAGNOSIS — E559 Vitamin D deficiency, unspecified: Secondary | ICD-10-CM

## 2018-07-03 DIAGNOSIS — R296 Repeated falls: Secondary | ICD-10-CM | POA: Diagnosis not present

## 2018-07-03 NOTE — Patient Instructions (Signed)
    Thank you for coming into the office today. I appreciate the opportunity to provide you with the care for your health and wellness. Today we discussed:      WASH YOUR HANDS WELL AND FREQUENTLY. AVOID TOUCHING YOUR FACE, UNLESS YOUR HANDS ARE FRESHLY WASHED.  GET FRESH AIR DAILY. STAY HYDRATED WITH WATER.   It was a pleasure to see you and I look forward to continuing to work together on your health and well-being. Please do not hesitate to call the office if you need care or have questions about your care.  Have a wonderful day and week.  With Gratitude,  Lexiana Spindel, DNP, AGNP-BC      

## 2018-07-03 NOTE — Progress Notes (Signed)
Virtual Visit via Telephone Note   This visit type was conducted due to national recommendations for restrictions regarding the COVID-19 Pandemic (e.g. social distancing) in an effort to limit this patient's exposure and mitigate transmission in our community.  Due to his co-morbid illnesses, this patient is at least at moderate risk for complications without adequate follow up.  This format is felt to be most appropriate for this patient at this time.  The patient did not have access to video technology/had technical difficulties with video requiring transitioning to audio format only (telephone).  All issues noted in this document were discussed and addressed.  No physical exam could be performed with this format.    Evaluation Performed:  Follow-up visit  Date:  07/03/2018   ID:  Jamie Frost, DOB 1975/08/29, MRN [id number]  Patient Location: Home Provider Location: Other:  Telemedicine  Location of Patient: Home Location of Provider: Telehealth Consent was obtain for visit to be over via telehealth. I verified that I am speaking with the correct person using two identifiers.  PCP:  Kerri PerchesSimpson, Margaret E, MD   Chief Complaint:  Follow up for HTN and Obesity  History of Present Illness:    Jamie Frost is a 43 y.o. male with history of HTN, pulmonary hypoplasia, and obesity, among others.  Jamie Frost and his aunt reports that he is taking his medications as directed and without issues.  They have no concerns or problems outside of the increase in falls that he has had over the last month.  He denied having any falls during the screening, but then reported that he had ankle pain and when questioned further he reported that he did had falls.  His aunt spoke with me over the phone and reported that he falls without warning and that there is no cause for the fall he has not passed out or lost consciousness.  She reports that both ankles are swollen.  She says that one is leaning  more into the side than it normally did.  But she is unsure which one it is.  Reports that he has been using a cane to walk so he does not he fall.  He has been complaining of discomfort and pain in them since he started having falls.  Unsure if he has any fractures or breaks.  Has not been taking anything for diabetes.  Unsure if he is having low blood sugar versus elevated blood sugar.  He is taking something for blood pressure.  But she reports that she does not know if it is related to medication.  As he just seems to be walking and just falls.  He can be in a chair and falls out of the chair.  The patient does not have symptoms concerning for COVID-19 infection (fever, chills, cough, or new shortness of breath).   Past Medical, Surgical, Social History, Allergies, and Medications have been Reviewed.   Past Medical History:  Diagnosis Date   Allergic rhinitis, seasonal    Diabetes mellitus without complication (HCC)    Hyperlipidemia    Hypertension    Impaired fasting glucose 12/09/2009   Obesity    Pulmonary hypoplasia    Past Surgical History:  Procedure Laterality Date   bilateral inguinal hernia     I/D abcess on back  2007   1 WEEK    right great toe surgery       Current Meds  Medication Sig   albuterol (PROVENTIL) (2.5 MG/3ML) 0.083%  nebulizer solution Take 3 mLs (2.5 mg total) by nebulization every 6 (six) hours as needed. One vial per nebulizer every 6 to 8 hours prn   benazepril (LOTENSIN) 10 MG tablet TAKE ONE (1) TABLET EACH DAY   busPIRone (BUSPAR) 15 MG tablet TAKE 1/2 TABLET TWICE DAILY   Elite Nebulizer System MISC by Does not apply route.     fluticasone (FLONASE) 50 MCG/ACT nasal spray USE 2 SPRAYS IN EACH NOSTRIL DAILY   loratadine (CLARITIN) 10 MG tablet TAKE ONE (1) TABLET EACH DAY   potassium chloride SA (K-DUR,KLOR-CON) 20 MEQ tablet TAKE ONE (1) TABLET EACH DAY   simvastatin (ZOCOR) 40 MG tablet TAKE ONE TABLET DAILY AT BEDTIME      Allergies:   Sulfonamide derivatives   Social History   Tobacco Use   Smoking status: Never Smoker   Smokeless tobacco: Never Used  Substance Use Topics   Alcohol use: No   Drug use: No     Family Hx: The patient's family history includes Arthritis in his mother; Hypertension in his mother; Obesity in his mother.  ROS:   Please see the history of present illness.    All other systems reviewed and are negative.   Labs/Other Tests and Data Reviewed:     Recent Labs: 01/01/2018: ALT 16; BUN 14; Creat 1.26; Hemoglobin 13.4; Platelets 287; Potassium 4.8; Sodium 140; TSH 1.06   Recent Lipid Panel Lab Results  Component Value Date/Time   CHOL 139 01/01/2018 03:48 PM   TRIG 64 01/01/2018 03:48 PM   HDL 53 01/01/2018 03:48 PM   CHOLHDL 2.6 01/01/2018 03:48 PM   LDLCALC 72 01/01/2018 03:48 PM    Wt Readings from Last 3 Encounters:  07/03/18 267 lb (121.1 kg)  01/01/18 288 lb (130.6 kg)  08/20/17 283 lb 1.9 oz (128.4 kg)     Objective:    Vital Signs:  Wt 267 lb (121.1 kg)    BMI 40.60 kg/m    GEN:  alert RESPIRATORY:  no shortness of breath  PSYCH:  normal affect    ASSESSMENT & PLAN:    1. Diabetes mellitus type 2 in obese (HCC) Last A1c was 5.9 in October 2019.  Has been diet controlled.  Appears to be doing okay on this per aunt.  Unable to assess this at this time.  Will need to get labs for checking sugar levels, if this could be a possible cause in his increase in falls.   2. Morbid obesity (HCC) He reports that he has lost some weight and weighs 267.  He reports that he has not been trying to lose weight.  Is been eating as he is normally been eating.  Unsure if there is something going on with his diabetes at this time.  Concerning that he has lost some weight and has been having multiple falls.  Will be bringing him into an appointment next week for Dr. Lodema Hong to assess and in person.  3. Multiple falls Increasing falls in the last month.  Now must  walk with a cane.  Has a limp normally.  Bilateral ankle swelling and discomfort.  Will be getting x-rays to make sure that there is nothing else going on or fractures that are occurring.  Pending results of x-rays will prescribe medication.  Will be bringing him in next week to meet with Dr. Lodema Hong in person to make sure there is nothing else going on that could not be identified in the x-ray or this visit.  -  DG Ankle Complete Left - DG Ankle Complete Right  4. Essential hypertension Reports taking blood pressure medicine as directed.  Without having any trouble.  There is no syncopal or fainting noted during his falls.  Unsure if this is blood pressure related.  We will be checking his blood pressure at his appointment coming up next week.  Continue medication for now.  Time:   Today, I have spent 10 minutes with the patient with telehealth technology discussing the above problems.     Medication Adjustments/Labs and Tests Ordered: Current medicines are reviewed at length with the patient today.  Concerns regarding medicines are outlined above.   Tests Ordered: Orders Placed This Encounter  Procedures   DG Ankle Complete Left   DG Ankle Complete Right    Medication Changes: No orders of the defined types were placed in this encounter.   Disposition:  Follow up May 6th 2020  Signed, Freddy Finner, NP  07/03/2018 2:33 PM     [facility]

## 2018-07-04 ENCOUNTER — Other Ambulatory Visit: Payer: Self-pay

## 2018-07-04 ENCOUNTER — Other Ambulatory Visit (HOSPITAL_COMMUNITY)
Admission: RE | Admit: 2018-07-04 | Discharge: 2018-07-04 | Disposition: A | Discharge status: Home / Self Care | Payer: Medicare Other | Source: Ambulatory Visit | Attending: Family Medicine | Admitting: Family Medicine

## 2018-07-04 ENCOUNTER — Ambulatory Visit (HOSPITAL_COMMUNITY)
Admission: RE | Admit: 2018-07-04 | Discharge: 2018-07-04 | Disposition: A | Discharge status: Home / Self Care | Payer: Medicare Other | Source: Ambulatory Visit | Attending: Family Medicine | Admitting: Family Medicine

## 2018-07-04 DIAGNOSIS — E1169 Type 2 diabetes mellitus with other specified complication: Secondary | ICD-10-CM | POA: Diagnosis not present

## 2018-07-04 DIAGNOSIS — S99911A Unspecified injury of right ankle, initial encounter: Secondary | ICD-10-CM | POA: Diagnosis not present

## 2018-07-04 DIAGNOSIS — R296 Repeated falls: Secondary | ICD-10-CM | POA: Diagnosis not present

## 2018-07-04 DIAGNOSIS — E559 Vitamin D deficiency, unspecified: Secondary | ICD-10-CM | POA: Diagnosis not present

## 2018-07-04 DIAGNOSIS — I10 Essential (primary) hypertension: Secondary | ICD-10-CM | POA: Insufficient documentation

## 2018-07-04 DIAGNOSIS — S99912A Unspecified injury of left ankle, initial encounter: Secondary | ICD-10-CM | POA: Diagnosis not present

## 2018-07-04 LAB — COMPREHENSIVE METABOLIC PANEL
ALT: 20 U/L (ref 0–44)
AST: 23 U/L (ref 15–41)
Albumin: 4.2 g/dL (ref 3.5–5.0)
Alkaline Phosphatase: 90 U/L (ref 38–126)
Anion gap: 8 (ref 5–15)
BUN: 12 mg/dL (ref 6–20)
CO2: 29 mmol/L (ref 22–32)
Calcium: 9.2 mg/dL (ref 8.9–10.3)
Chloride: 101 mmol/L (ref 98–111)
Creatinine, Ser: 0.95 mg/dL (ref 0.61–1.24)
GFR calc Af Amer: >60 mL/min (ref >60)
GFR calc non Af Amer: >60 mL/min (ref >60)
Glucose, Bld: 98 mg/dL (ref 70–99)
Potassium: 4 mmol/L (ref 3.5–5.1)
Sodium: 138 mmol/L (ref 135–145)
Total Bilirubin: 0.5 mg/dL (ref 0.3–1.2)
Total Protein: 8.1 g/dL (ref 6.5–8.1)

## 2018-07-04 LAB — HEMOGLOBIN A1C
Hgb A1c MFr Bld: 5.7 % — ABNORMAL HIGH (ref 4.8–5.6)
Mean Plasma Glucose: 116.89 mg/dL

## 2018-07-04 LAB — CBC
HCT: 42.6 % (ref 39.0–52.0)
Hemoglobin: 13 g/dL (ref 13.0–17.0)
MCH: 27 pg (ref 26.0–34.0)
MCHC: 30.5 g/dL (ref 30.0–36.0)
MCV: 88.4 fL (ref 80.0–100.0)
Platelets: 276 10*3/uL (ref 150–400)
RBC: 4.82 MIL/uL (ref 4.22–5.81)
RDW: 15.3 % (ref 11.5–15.5)
WBC: 9.4 10*3/uL (ref 4.0–10.5)
nRBC: 0 % (ref 0.0–0.2)

## 2018-07-04 LAB — TSH: TSH: 1.28 u[IU]/mL (ref 0.350–4.500)

## 2018-07-05 LAB — VITAMIN D 25 HYDROXY (VIT D DEFICIENCY, FRACTURES): Vit D, 25-Hydroxy: 22.4 ng/mL — ABNORMAL LOW (ref 30.0–100.0)

## 2018-07-05 NOTE — Progress Notes (Signed)
Overall labs look good and stable, outside of Vit D level, which previously looks much higher than it is now.  Could consider supplementation. A1c has improved.  Appt with Dr Lodema Hong next week for assessment of falls and ankles in office.

## 2018-07-10 ENCOUNTER — Encounter: Payer: Self-pay | Admitting: Family Medicine

## 2018-07-10 ENCOUNTER — Other Ambulatory Visit: Payer: Self-pay

## 2018-07-10 ENCOUNTER — Ambulatory Visit (INDEPENDENT_AMBULATORY_CARE_PROVIDER_SITE_OTHER): Payer: Medicare Other | Admitting: Family Medicine

## 2018-07-10 ENCOUNTER — Ambulatory Visit (INDEPENDENT_AMBULATORY_CARE_PROVIDER_SITE_OTHER): Payer: Medicare Other | Admitting: Orthopedic Surgery

## 2018-07-10 ENCOUNTER — Encounter: Payer: Self-pay | Admitting: Orthopedic Surgery

## 2018-07-10 VITALS — BP 117/75 | HR 78 | Temp 97.0°F | Ht 68.0 in | Wt 278.0 lb

## 2018-07-10 VITALS — BP 118/78 | HR 88 | Resp 15 | Ht 68.0 in | Wt 278.0 lb

## 2018-07-10 DIAGNOSIS — E785 Hyperlipidemia, unspecified: Secondary | ICD-10-CM | POA: Diagnosis not present

## 2018-07-10 DIAGNOSIS — R296 Repeated falls: Secondary | ICD-10-CM | POA: Insufficient documentation

## 2018-07-10 DIAGNOSIS — Q336 Congenital hypoplasia and dysplasia of lung: Secondary | ICD-10-CM

## 2018-07-10 DIAGNOSIS — G809 Cerebral palsy, unspecified: Secondary | ICD-10-CM

## 2018-07-10 DIAGNOSIS — M216X1 Other acquired deformities of right foot: Secondary | ICD-10-CM | POA: Diagnosis not present

## 2018-07-10 DIAGNOSIS — M216X2 Other acquired deformities of left foot: Secondary | ICD-10-CM

## 2018-07-10 DIAGNOSIS — I10 Essential (primary) hypertension: Secondary | ICD-10-CM

## 2018-07-10 DIAGNOSIS — R7301 Impaired fasting glucose: Secondary | ICD-10-CM | POA: Diagnosis not present

## 2018-07-10 DIAGNOSIS — F411 Generalized anxiety disorder: Secondary | ICD-10-CM

## 2018-07-10 DIAGNOSIS — R7303 Prediabetes: Secondary | ICD-10-CM

## 2018-07-10 DIAGNOSIS — J301 Allergic rhinitis due to pollen: Secondary | ICD-10-CM

## 2018-07-10 MED ORDER — ERGOCALCIFEROL 1.25 MG (50000 UT) PO CAPS: 50000.0000 [IU] | ORAL_CAPSULE | ORAL | 3 refills | Status: DC

## 2018-07-10 NOTE — Progress Notes (Signed)
NEW PROBLEM OFFICE VISIT  Chief Complaint  Patient presents with  . Ankle Pain    bilateral left greater than right     43 year old male with cerebral palsy presents with decreasing ability to stand and walk increasing bilateral foot pain.  The person who is with him says he wore braces up to the age of 69 and then has not really seen a doctor about his foot and lower extremity deformities which are associated with some patellofemoral instability  Location bilateral feet  Quality dull pain  Severity moderate  Duration several years worse in the last 2 to 3 months associated with gait disturbance   Review of Systems  Constitutional: Negative for chills and fever.  Musculoskeletal: Positive for falls and joint pain.  Neurological: Negative for tingling and sensory change.     Past Medical History:  Diagnosis Date  . Allergic rhinitis, seasonal   . Diabetes mellitus without complication (HCC)   . Hyperlipidemia   . Hypertension   . Impaired fasting glucose 12/09/2009  . Obesity   . Pulmonary hypoplasia     Past Surgical History:  Procedure Laterality Date  . bilateral inguinal hernia    . I/D abcess on back  2007   1 WEEK   . right great toe surgery      Family History  Problem Relation Age of Onset  . Obesity Mother   . Hypertension Mother   . Arthritis Mother    Social History   Tobacco Use  . Smoking status: Never Smoker  . Smokeless tobacco: Never Used  Substance Use Topics  . Alcohol use: No  . Drug use: No    Allergies  Allergen Reactions  . Sulfonamide Derivatives     Current Meds  Medication Sig  . albuterol (PROVENTIL) (2.5 MG/3ML) 0.083% nebulizer solution Take 3 mLs (2.5 mg total) by nebulization every 6 (six) hours as needed. One vial per nebulizer every 6 to 8 hours prn  . benazepril (LOTENSIN) 10 MG tablet TAKE ONE (1) TABLET EACH DAY  . busPIRone (BUSPAR) 15 MG tablet TAKE 1/2 TABLET TWICE DAILY  . Elite Nebulizer System MISC by Does  not apply route.    . ergocalciferol (VITAMIN D2) 1.25 MG (50000 UT) capsule Take 1 capsule (50,000 Units total) by mouth once a week. One capsule once weekly  . fluticasone (FLONASE) 50 MCG/ACT nasal spray USE 2 SPRAYS IN EACH NOSTRIL DAILY  . loratadine (CLARITIN) 10 MG tablet TAKE ONE (1) TABLET EACH DAY  . potassium chloride SA (K-DUR,KLOR-CON) 20 MEQ tablet TAKE ONE (1) TABLET EACH DAY  . simvastatin (ZOCOR) 40 MG tablet TAKE ONE TABLET DAILY AT BEDTIME    BP 117/75   Pulse 78   Temp (!) 97 F (36.1 C) (Tympanic)   Ht 5\' 8"  (1.727 m)   Wt 278 lb (126.1 kg)   BMI 42.27 kg/m   Physical Exam Overall appearance initially cannot tell any gross deformities, seems to be large and overweight Oriented to person place and time Mood pleasant affect flat Gait supported by a cane he has a planovalgus foot on the right cavovarus foot on the left with altered gait including a valgus left knee  Ortho Exam  Right foot seems to have a flatfoot deformity normal ankle motion decreased subtalar motion but no instability muscle tone normal no atrophy the skin shows no lesions but callus formation and he has claw toe deformities including the great toe with a callus over the IP joint pulses and  temperature normal no edema normal painful stimuli pressure stimuli.  Left foot seems to have a cavovarus type foot with decreased ankle motion and subtalar motion but stable dorsiflexion plantarflexion strength is intact eversion strength is weak skin is normal pulses are good sensation is intact   MEDICAL DECISION SECTION  [facility]  Ankle x-rays   My independent reading of xrays:  Standard 3 views right ankle ankle mortise is intact no significant joint space narrowing, there is are some degenerative changes in the subtalar joint posterior facet large anterior process  Left ankle degenerative changes joint space narrowing in the ankle joint itself although the ankle  mortise remains  Ankle x-rays ankle x-ray reduced intact there are several degenerative changes around the ankle joint there is a large prominent navicular head  Encounter Diagnoses  Name Primary?  . Cavovarus deformity of foot, acquired, left Yes  . Planovalgus deformity of foot, acquired, right   . Cerebral palsy, unspecified type (HCC)     PLAN: (Rx., injectx, surgery, frx, mri/ct) He will need referral to foot and ankle specialist, this is way out of my territory to try to treat  No orders of the defined types were placed in this encounter.   Fuller CanadaStanley Benjamim Harnish, MD  07/10/2018 11:05 AM

## 2018-07-10 NOTE — Assessment & Plan Note (Signed)
One month h/o recurrent falls while walking, left foot everted, c/o bilateral ankle pain ,howveer, x ray of the ankles negative for fracture, only shows arthritis. Recommend Orthopedic evalaution asap

## 2018-07-10 NOTE — Patient Instructions (Addendum)
Wellness due June 20 or after please schedule  MD f/u in 5 months, call if you need me sooner.  Please get fasting lipid, cmp and EGFR and HBA1C 1 week before your October visit  You are referred to Orthopedic Doctor to further evaluate your joints esp as there appears to be deformity of the left knee and ankle and you have been having recurrent falls for the past 1 month   Be careful not to fall, use the equipment that you already have to help you1

## 2018-07-13 ENCOUNTER — Encounter: Payer: Self-pay | Admitting: Family Medicine

## 2018-07-13 NOTE — Progress Notes (Signed)
Jamie Frost     MRN: [id number]      DOB: 12/16/1975   HPI Mr. Jamie Frost is here for follow up and re-evaluation of chronic medical conditions, medication management and review of any available recent lab and radiology data.  Preventive health is updated, specifically  Cancer screening and Immunization.   Questions or concerns regarding consultations or procedures which the PT has had in the interim are  addressed. The PT denies any adverse reactions to current medications since the last visit.  C/o left ankle pain and instability and instablity to walk without assistive device   ROS Denies recent fever or chills. Denies sinus pressure, nasal congestion, ear pain or sore throat. Denies chest congestion, productive cough or wheezing. Denies chest pains, palpitations and leg swelling Denies abdominal pain, nausea, vomiting,diarrhea or constipation.   Denies dysuria, frequency, hesitancy or incontinence.  Denies headaches, seizures, numbness, or tingling. Denies depression, anxiety or insomnia. Denies skin break down or rash.   PE  BP 118/78   Pulse 88   Resp 15   Ht 5\' 8"  (1.727 m)   Wt 278 lb (126.1 kg)   SpO2 97%   BMI 42.27 kg/m   Patient alert and oriented and in no cardiopulmonary distress.  HEENT: No facial asymmetry, EOMI,   oropharynx pink and moist.  Neck supple no JVD, no mass.  Chest: Clear to auscultation bilaterally.  CVS: S1, S2 no murmurs, no S3.Regular rate.  ABD: Soft non tender.   Ext: No edema  MS: Decreased  ROM spine, normal in  shoulders, hips and knees.Decreased ROM left ankle with swelling and deformity  Skin: Intact, no ulcerations or rash noted.  Psych: Good eye contact, normal affect. Memory intact not anxious or depressed appearing.  CNS: CN 2-12 intact, power,  normal throughout.no focal deficits noted.   Assessment & Plan  Recurrent falls while walking One month h/o recurrent falls while walking, left foot everted, c/o  bilateral ankle pain ,howveer, x ray of the ankles negative for fracture, only shows arthritis. Recommend Orthopedic evalaution asap  Hyperlipidemia LDL goal <100 Hyperlipidemia:Low fat diet discussed and encouraged.   Lipid Panel  Lab Results  Component Value Date   CHOL 139 01/01/2018   HDL 53 01/01/2018   LDLCALC 72 01/01/2018   TRIG 64 01/01/2018   CHOLHDL 2.6 01/01/2018   Updated lab needed at/ before next visit.     Essential hypertension Controlled, no change in medication DASH diet and commitment to daily physical activity for a minimum of 30 minutes discussed and encouraged, as a part of hypertension management. The importance of attaining a healthy weight is also discussed.  BP/Weight 07/10/2018 07/10/2018 07/03/2018 01/01/2018 08/20/2017 04/16/2017 12/12/2016  Systolic BP 117 118 - 118 112 324118 120  Diastolic BP 75 78 - 80 78 74 74  Wt. (Lbs) 278 278 267 288 283.12 275 261  BMI 42.27 42.27 40.6 43.79 43.69 43.07 40.88       GENERALIZED ANXIETY DISORDER Controlled, no change in medication   ALLERGIC RHINITIS, SEASONAL Controlled, no change in medication   Prediabetes Patient educated about the importance of limiting  Carbohydrate intake , the need to commit to daily physical activity for a minimum of 30 minutes , and to commit weight loss. The fact that changes in all these areas will reduce or eliminate all together the development of diabetes is stressed.  Improved  Diabetic Labs Latest Ref Rng & Units 07/04/2018 01/01/2018 12/13/2016 08/09/2016 01/18/2016  HbA1c 4.8 -  5.6 % 5.7(H) 5.9(H) 5.6 5.7(H) 5.6  Microalbumin Not Estab. ug/mL - - - - -  Micro/Creat Ratio 0.0 - 30.0 mg/g creat - - - - -  Chol <200 mg/dL - 827 078 675 -  HDL >44 mg/dL - 53 55 56 -  Calc LDL mg/dL (calc) - 72 67 74 -  Triglycerides <150 mg/dL - 64 51 38 -  Creatinine 0.61 - 1.24 mg/dL 9.20 1.00 7.12 1.97 5.88   BP/Weight 07/10/2018 07/10/2018 07/03/2018 01/01/2018 08/20/2017 04/16/2017  12/12/2016  Systolic BP 117 118 - 118 112 325 120  Diastolic BP 75 78 - 80 78 74 74  Wt. (Lbs) 278 278 267 288 283.12 275 261  BMI 42.27 42.27 40.6 43.79 43.69 43.07 40.88   Foot/eye exam completion dates 01/18/2016 09/30/2014  Foot Form Completion Done Done

## 2018-07-13 NOTE — Assessment & Plan Note (Signed)
Controlled, no change in medication DASH diet and commitment to daily physical activity for a minimum of 30 minutes discussed and encouraged, as a part of hypertension management. The importance of attaining a healthy weight is also discussed.  BP/Weight 07/10/2018 07/10/2018 07/03/2018 01/01/2018 08/20/2017 04/16/2017 12/12/2016  Systolic BP 117 118 - 118 112 295 120  Diastolic BP 75 78 - 80 78 74 74  Wt. (Lbs) 278 278 267 288 283.12 275 261  BMI 42.27 42.27 40.6 43.79 43.69 43.07 40.88

## 2018-07-13 NOTE — Assessment & Plan Note (Signed)
Hyperlipidemia:Low fat diet discussed and encouraged.   Lipid Panel  Lab Results  Component Value Date   CHOL 139 01/01/2018   HDL 53 01/01/2018   LDLCALC 72 01/01/2018   TRIG 64 01/01/2018   CHOLHDL 2.6 01/01/2018   Updated lab needed at/ before next visit.    

## 2018-07-13 NOTE — Assessment & Plan Note (Signed)
Patient educated about the importance of limiting  Carbohydrate intake , the need to commit to daily physical activity for a minimum of 30 minutes , and to commit weight loss. The fact that changes in all these areas will reduce or eliminate all together the development of diabetes is stressed.  Improved  Diabetic Labs Latest Ref Rng & Units 07/04/2018 01/01/2018 12/13/2016 08/09/2016 01/18/2016  HbA1c 4.8 - 5.6 % 5.7(H) 5.9(H) 5.6 5.7(H) 5.6  Microalbumin Not Estab. ug/mL - - - - -  Micro/Creat Ratio 0.0 - 30.0 mg/g creat - - - - -  Chol <200 mg/dL - 585 277 824 -  HDL >23 mg/dL - 53 55 56 -  Calc LDL mg/dL (calc) - 72 67 74 -  Triglycerides <150 mg/dL - 64 51 38 -  Creatinine 0.61 - 1.24 mg/dL 5.36 1.44 3.15 4.00 8.67   BP/Weight 07/10/2018 07/10/2018 07/03/2018 01/01/2018 08/20/2017 04/16/2017 12/12/2016  Systolic BP 117 118 - 118 112 619 120  Diastolic BP 75 78 - 80 78 74 74  Wt. (Lbs) 278 278 267 288 283.12 275 261  BMI 42.27 42.27 40.6 43.79 43.69 43.07 40.88   Foot/eye exam completion dates 01/18/2016 09/30/2014  Foot Form Completion Done Done

## 2018-07-13 NOTE — Assessment & Plan Note (Signed)
Controlled, no change in medication  

## 2018-07-16 ENCOUNTER — Other Ambulatory Visit: Payer: Self-pay

## 2018-07-16 ENCOUNTER — Encounter: Payer: Self-pay | Admitting: Orthopedic Surgery

## 2018-07-16 ENCOUNTER — Ambulatory Visit (INDEPENDENT_AMBULATORY_CARE_PROVIDER_SITE_OTHER): Payer: Medicare Other | Admitting: Orthopedic Surgery

## 2018-07-16 VITALS — Ht 68.0 in | Wt 278.0 lb

## 2018-07-16 DIAGNOSIS — M216X2 Other acquired deformities of left foot: Secondary | ICD-10-CM

## 2018-07-16 DIAGNOSIS — R296 Repeated falls: Secondary | ICD-10-CM | POA: Diagnosis not present

## 2018-07-16 DIAGNOSIS — M24572 Contracture, left ankle: Secondary | ICD-10-CM | POA: Diagnosis not present

## 2018-07-16 DIAGNOSIS — Z6841 Body Mass Index (BMI) 40.0 and over, adult: Secondary | ICD-10-CM | POA: Diagnosis not present

## 2018-07-16 DIAGNOSIS — G801 Spastic diplegic cerebral palsy: Secondary | ICD-10-CM | POA: Diagnosis not present

## 2018-07-16 NOTE — Progress Notes (Signed)
Office Visit Note   Patient: Jamie Frost           Date of Birth: Feb 15, 1976           MRN: [id number] Visit Date: 07/16/2018              Requested by: Vickki Hearing, MD [address] [place], [place] [address] PCP: Kerri Perches, MD  Chief Complaint  Patient presents with  . Right Ankle - Follow-up      HPI: Patient is a 43 year old gentleman who is seen for initial evaluation for congenital deformities both feet.  Patient is seen with his aunt who is his caregiver.  Patient reports that he is unsteady with his gait and falls multiple times.  Patient does use canes but still has difficulty with balance.  Radiographs have been obtained in April.  Assessment & Plan: Visit Diagnoses:  1. Recurrent falls while walking   2. Cavovarus deformity of foot, acquired, left   3. Equinus contracture of left ankle   4. Spastic diplegic cerebral palsy (HCC)     Plan: Discussed with the patient and his aunt that I would not be able to improve his balance but could proceed with surgery to provide him with a stable plantigrade foot on the left for ambulation as well as bracing to stabilize the right foot.  Risks and benefits were discussed including risk of the skin not healing risk of the bone not healing need for additional surgery.  Patient and his aunt state they understand and wish to proceed at this time would plan for surgery at [facility] and will call in several weeks.  Prescription is provided for [employer] for an anterior AFO on the ight foot and ankle.    plan for proceeding with a anterior approach tibial calcaneal fusion  Follow-Up Instructions: Return in about 1 week (around 07/23/2018).   Ortho Exam  Patient is alert, oriented, no adenopathy, well-dressed, normal affect, normal respiratory effort. On examination patient ambulates on the lateral side of the left foot with the equinus contracture and cavovarus deformity.  Patient's right foot is plantigrade.   Patient has a good palpable dorsalis pedis pulse bilaterally.  With manipulation I can get the right foot to a plantigrade position with the ankle at 90 degrees.  This should be amenable to a brace.  The left foot has a fixed equinus contracture of the hindfoot with dorsiflexion 30 degrees short of neutral.  There is also a fixed equinovarus contracture through the midfoot as well.  Patient does have weakness in both lower extremities and with the fixed contracture on the left foot he does not have dorsiflexion strength.  Patient has dorsiflexion strength on the right is 4 out of 5.  Good plantar flexion strength bilaterally.  Imaging: No results found. No images are attached to the encounter.  Labs: Lab Results  Component Value Date   HGBA1C 5.7 (H) 07/04/2018   HGBA1C 5.9 (H) 01/01/2018   HGBA1C 5.6 12/13/2016     Lab Results  Component Value Date   ALBUMIN 4.2 07/04/2018   ALBUMIN 4.1 08/09/2016   ALBUMIN 4.3 01/18/2016    Body mass index is 42.27 kg/m.  Orders:  No orders of the defined types were placed in this encounter.  No orders of the defined types were placed in this encounter.    Procedures: No procedures performed  Clinical Data: No additional findings.  ROS:  All other systems negative, except as noted in the  HPI. Review of Systems  Objective: Vital Signs: Ht 5\' 8"  (1.727 m)   Wt 278 lb (126.1 kg)   BMI 42.27 kg/m   Specialty Comments:  No specialty comments available.  PMFS History: Patient Active Problem List   Diagnosis Date Noted  . Recurrent falls while walking 07/10/2018  . Prediabetes 08/12/2016  . Pulmonary hypoplasia 09/02/2010  . GENERALIZED ANXIETY DISORDER 10/11/2008  . Hyperlipidemia LDL goal <100 09/23/2007  . Morbid obesity (HCC) 09/23/2007  . Essential hypertension 09/23/2007  . ALLERGIC RHINITIS, SEASONAL 09/23/2007   Past Medical History:  Diagnosis Date  . Allergic rhinitis, seasonal   . Diabetes mellitus without  complication (HCC)   . Hyperlipidemia   . Hypertension   . Impaired fasting glucose 12/09/2009  . Obesity   . Pulmonary hypoplasia     Family History  Problem Relation Age of Onset  . Obesity Mother   . Hypertension Mother   . Arthritis Mother     Past Surgical History:  Procedure Laterality Date  . bilateral inguinal hernia    . I/D abcess on back  2007   1 WEEK   . right great toe surgery     Social History   Occupational History  . Not on file  Tobacco Use  . Smoking status: Never Smoker  . Smokeless tobacco: Never Used  Substance and Sexual Activity  . Alcohol use: No  . Drug use: No  . Sexual activity: Never

## 2018-07-18 ENCOUNTER — Telehealth: Payer: Self-pay | Admitting: Physician Assistant

## 2018-07-18 NOTE — Telephone Encounter (Signed)
Received call from [employer] to update orders for right ankle for right Double upright AFO with lateral T strap and extra depth shoes and order completed.   RAYBURN,SHAWN,PA-C

## 2018-07-26 ENCOUNTER — Other Ambulatory Visit: Payer: Self-pay | Admitting: Family Medicine

## 2018-08-06 NOTE — Pre-Procedure Instructions (Signed)
Jamie Frost  08/06/2018    Your procedure is scheduled on Friday June 5.  Report to [facility], Main Entrance or Entrance "A" at 6:30 AM                      Your surgery or procedure is scheduled for 8:30 AM   Call this number if you have problems the morning of surgery: [phone]  This is the number for the Pre- Surgical Desk.    Remember:  Do not eat after midnight.  You may drink clear liquids until 4:30 .  Clear liquids allowed are:   Water, Juice (non-citric and without pulp), Carbonated beverages, Clear Tea, Black Coffee only, Plain Jell-O only, Gatorade and Plain Popsicles only   >>>>>> Please complete your G2  that was provided to you by 4:30 .  Please, if able, drink it in one setting. DO NOT SIP.   Take these medicines the morning of surgery with A SIP OF WATER :  busPIRone (BUSPAR)              fluticasone (FLONASE)              loratadine (CLARITIN)   STOP taking Aspirin, Aspirin Products (Goody Powder, Excedrin Migraine), Ibuprofen (Advil), Naproxen (Aleve), Vitamins and Herbal Products (ie Fish Oil). Special instructions:  [place]- Preparing For Surgery  Before surgery, you can play an important role. Because skin is not sterile, your skin needs to be as free of germs as possible. You can reduce the number of germs on your skin by washing with CHG (chlorahexidine gluconate) Soap before surgery.  CHG is an antiseptic cleaner which kills germs and bonds with the skin to continue killing germs even after washing.    Oral Hygiene is also important to reduce your risk of infection.  Remember - BRUSH YOUR TEETH THE MORNING OF SURGERY WITH YOUR REGULAR TOOTHPASTE  Please do not use if you have an allergy to CHG or antibacterial soaps. If your skin becomes reddened/irritated stop using the CHG.  Do not shave (including legs and underarms) for at least 48 hours prior to first CHG shower. It is OK to shave your face.  Please follow these instructions  carefully.   1. Shower the NIGHT BEFORE SURGERY and the MORNING OF SURGERY with CHG.   2. If you chose to wash your hair, wash your hair first as usual with your normal shampoo.  3. After you shampoo,wash your face and private area with the soap you use at home, then rinse your hair and body thoroughly to remove the shampoo and soap.  4. Use CHG as you would any other liquid soap. You can apply CHG directly to the skin and wash gently with a scrungie or a clean washcloth.   5. Apply the CHG Soap to your body ONLY FROM THE NECK DOWN.  Do not use on open wounds or open sores. Avoid contact with your eyes, ears, mouth and genitals (private parts).  6. Wash thoroughly, paying special attention to the area where your surgery will be performed.  7. Thoroughly rinse your body with warm water from the neck down.  8. DO NOT shower/wash with your normal soap after using and rinsing off the CHG Soap.  9. Pat yourself dry with a CLEAN TOWEL.  10. Wear CLEAN PAJAMAS to bed the night before surgery, wear comfortable clothes the morning of surgery  11. Place CLEAN SHEETS on your bed  the night of your first shower and DO NOT SLEEP WITH PETS.  Day of Surgery:  Shower as Instructed above   Do not wear lotions, powders, or colognes, or deodorant.  Please wear clean clothes to the hospital/surgery center.    Remember to brush your teeth WITH YOUR REGULAR TOOTHPASTE.              Do not wear jewelry, make-up or nail polish.   Men may shave face and neck.   Do not bring valuables to the hospital.   [facility] is not responsible for any belongings or valuables.  Contacts, dentures or bridgework may not be worn into surgery.  Leave your suitcase in the car.  After surgery it may be brought to your room.  For patients admitted to the hospital, discharge time will be determined by your treatment team.  Patients discharged the day of surgery will not be allowed to drive home.   Please read over the  following fact sheets that you were given.

## 2018-08-07 ENCOUNTER — Encounter (HOSPITAL_COMMUNITY)
Admission: RE | Admit: 2018-08-07 | Discharge: 2018-08-07 | Disposition: A | Discharge status: Home / Self Care | Payer: Medicare Other | Source: Ambulatory Visit | Attending: Orthopedic Surgery | Admitting: Orthopedic Surgery

## 2018-08-07 ENCOUNTER — Other Ambulatory Visit: Payer: Self-pay

## 2018-08-07 ENCOUNTER — Other Ambulatory Visit (HOSPITAL_COMMUNITY)
Admission: RE | Admit: 2018-08-07 | Discharge: 2018-08-07 | Disposition: A | Discharge status: Home / Self Care | Payer: Medicare Other | Source: Ambulatory Visit | Attending: Orthopedic Surgery | Admitting: Orthopedic Surgery

## 2018-08-07 ENCOUNTER — Encounter (HOSPITAL_COMMUNITY): Payer: Self-pay

## 2018-08-07 ENCOUNTER — Telehealth: Payer: Self-pay | Admitting: Radiology

## 2018-08-07 DIAGNOSIS — I10 Essential (primary) hypertension: Secondary | ICD-10-CM | POA: Diagnosis not present

## 2018-08-07 DIAGNOSIS — Z79899 Other long term (current) drug therapy: Secondary | ICD-10-CM | POA: Diagnosis not present

## 2018-08-07 DIAGNOSIS — Z1159 Encounter for screening for other viral diseases: Secondary | ICD-10-CM | POA: Diagnosis not present

## 2018-08-07 DIAGNOSIS — Z8249 Family history of ischemic heart disease and other diseases of the circulatory system: Secondary | ICD-10-CM | POA: Diagnosis not present

## 2018-08-07 DIAGNOSIS — F419 Anxiety disorder, unspecified: Secondary | ICD-10-CM | POA: Diagnosis not present

## 2018-08-07 DIAGNOSIS — Z882 Allergy status to sulfonamides status: Secondary | ICD-10-CM | POA: Diagnosis not present

## 2018-08-07 DIAGNOSIS — Z8261 Family history of arthritis: Secondary | ICD-10-CM | POA: Diagnosis not present

## 2018-08-07 DIAGNOSIS — Q336 Congenital hypoplasia and dysplasia of lung: Secondary | ICD-10-CM | POA: Diagnosis not present

## 2018-08-07 DIAGNOSIS — E669 Obesity, unspecified: Secondary | ICD-10-CM | POA: Diagnosis not present

## 2018-08-07 DIAGNOSIS — R2681 Unsteadiness on feet: Secondary | ICD-10-CM | POA: Diagnosis not present

## 2018-08-07 DIAGNOSIS — Z7951 Long term (current) use of inhaled steroids: Secondary | ICD-10-CM | POA: Diagnosis not present

## 2018-08-07 DIAGNOSIS — E785 Hyperlipidemia, unspecified: Secondary | ICD-10-CM | POA: Diagnosis not present

## 2018-08-07 DIAGNOSIS — M216X2 Other acquired deformities of left foot: Secondary | ICD-10-CM | POA: Diagnosis not present

## 2018-08-07 DIAGNOSIS — Z6841 Body Mass Index (BMI) 40.0 and over, adult: Secondary | ICD-10-CM | POA: Diagnosis not present

## 2018-08-07 DIAGNOSIS — M1990 Unspecified osteoarthritis, unspecified site: Secondary | ICD-10-CM | POA: Diagnosis not present

## 2018-08-07 DIAGNOSIS — E119 Type 2 diabetes mellitus without complications: Secondary | ICD-10-CM | POA: Diagnosis not present

## 2018-08-07 HISTORY — DX: Unspecified osteoarthritis, unspecified site: M19.90

## 2018-08-07 LAB — BASIC METABOLIC PANEL
Anion gap: 7 (ref 5–15)
BUN: 9 mg/dL (ref 6–20)
CO2: 27 mmol/L (ref 22–32)
Calcium: 9.1 mg/dL (ref 8.9–10.3)
Chloride: 106 mmol/L (ref 98–111)
Creatinine, Ser: 1 mg/dL (ref 0.61–1.24)
GFR calc Af Amer: >60 mL/min (ref >60)
GFR calc non Af Amer: >60 mL/min (ref >60)
Glucose, Bld: 110 mg/dL — ABNORMAL HIGH (ref 70–99)
Potassium: 4.2 mmol/L (ref 3.5–5.1)
Sodium: 140 mmol/L (ref 135–145)

## 2018-08-07 LAB — CBC
HCT: 42.1 % (ref 39.0–52.0)
Hemoglobin: 12.5 g/dL — ABNORMAL LOW (ref 13.0–17.0)
MCH: 26.6 pg (ref 26.0–34.0)
MCHC: 29.7 g/dL — ABNORMAL LOW (ref 30.0–36.0)
MCV: 89.6 fL (ref 80.0–100.0)
Platelets: 241 10*3/uL (ref 150–400)
RBC: 4.7 MIL/uL (ref 4.22–5.81)
RDW: 15.4 % (ref 11.5–15.5)
WBC: 9.2 10*3/uL (ref 4.0–10.5)
nRBC: 0 % (ref 0.0–0.2)

## 2018-08-07 LAB — SARS CORONAVIRUS 2 BY RT PCR (HOSPITAL ORDER, PERFORMED IN ~~LOC~~ HOSPITAL LAB): SARS Coronavirus 2: NEGATIVE

## 2018-08-07 LAB — SURGICAL PCR SCREEN
MRSA, PCR: POSITIVE — AB
Staphylococcus aureus: POSITIVE — AB

## 2018-08-07 LAB — GLUCOSE, CAPILLARY: Glucose-Capillary: 105 mg/dL — ABNORMAL HIGH (ref 70–99)

## 2018-08-07 NOTE — Telephone Encounter (Signed)
FYI this pt is sch for surgery on Friday.

## 2018-08-07 NOTE — Telephone Encounter (Signed)
Received call from Ron[place]a with Pre Admission at [facility]. Patient has tested positive for MRSA and Staph.  He is scheduled for left tibiocalcaneal fusion on 08/09/2018.

## 2018-08-07 NOTE — Progress Notes (Signed)
PCP -  Dr. Idelle Jo Cardiologist - denies  Chest x-ray - denies EKG - 08/07/2018 Stress Test - denies ECHO - denies Cardiac Cath - denies  Sleep Study - denies CPAP - denies  Fasting Blood Sugar - doesn't check, per aunt "does not take medicine for diabetes, used to be diabetic A1c - 07/04/2018  Blood Thinner Instructions: N/A Aspirin Instructions: N/A  Anesthesia review: No.  Coronavirus Screening  Have you experienced the following symptoms:  Cough yes/no: No Fever (>100.71F)  yes/no: No Runny nose yes/no: No Sore throat yes/no: No Difficulty breathing/shortness of breath  yes/no: No  Have you or a family member traveled in the last 14 days and where? yes/no: No  If the patient indicates "YES" to the above questions, their PAT will be rescheduled to limit the exposure to others and, the surgeon will be notified. THE PATIENT WILL NEED TO BE ASYMPTOMATIC FOR 14 DAYS.   If the patient is not experiencing any of these symptoms, the PAT nurse will instruct them to NOT bring anyone with them to their appointment since they may have these symptoms or traveled as well.   Please remind your patients and families that hospital visitation restrictions are in effect and the importance of the restrictions.    Patient denies shortness of breath, fever, cough and chest pain at PAT appointment  Patient verbalized understanding of instructions that were given to them at the PAT appointment. Patient was also instructed that they will need to review over the PAT instructions again at home before surgery.

## 2018-08-08 MED ORDER — VANCOMYCIN HCL 10 G IV SOLR
1500.0000 mg | INTRAVENOUS | Status: AC
  Administered 2018-08-09: 1500 mg via INTRAVENOUS
  Filled 2018-08-08: qty 1500

## 2018-08-08 MED ORDER — DEXTROSE 5 % IV SOLN
3.0000 g | INTRAVENOUS | Status: AC
  Administered 2018-08-09: 3 g via INTRAVENOUS
  Filled 2018-08-08: qty 3000

## 2018-08-09 ENCOUNTER — Inpatient Hospital Stay (HOSPITAL_COMMUNITY): Payer: Medicare Other | Admitting: Physician Assistant

## 2018-08-09 ENCOUNTER — Encounter (HOSPITAL_COMMUNITY): Payer: Self-pay

## 2018-08-09 ENCOUNTER — Ambulatory Visit (HOSPITAL_COMMUNITY)
Admission: RE | Admit: 2018-08-09 | Discharge: 2018-08-09 | Disposition: A | Discharge status: Home / Self Care | Payer: Medicare Other | Attending: Orthopedic Surgery | Admitting: Orthopedic Surgery

## 2018-08-09 ENCOUNTER — Inpatient Hospital Stay (HOSPITAL_COMMUNITY): Payer: Medicare Other | Admitting: Certified Registered Nurse Anesthetist

## 2018-08-09 ENCOUNTER — Other Ambulatory Visit: Payer: Self-pay

## 2018-08-09 ENCOUNTER — Encounter (HOSPITAL_COMMUNITY)
Admission: RE | Disposition: A | Discharge status: Home / Self Care | Payer: Self-pay | Source: Home / Self Care | Attending: Orthopedic Surgery

## 2018-08-09 DIAGNOSIS — Z882 Allergy status to sulfonamides status: Secondary | ICD-10-CM | POA: Insufficient documentation

## 2018-08-09 DIAGNOSIS — Z8249 Family history of ischemic heart disease and other diseases of the circulatory system: Secondary | ICD-10-CM | POA: Insufficient documentation

## 2018-08-09 DIAGNOSIS — E669 Obesity, unspecified: Secondary | ICD-10-CM | POA: Insufficient documentation

## 2018-08-09 DIAGNOSIS — R2681 Unsteadiness on feet: Secondary | ICD-10-CM | POA: Insufficient documentation

## 2018-08-09 DIAGNOSIS — E785 Hyperlipidemia, unspecified: Secondary | ICD-10-CM | POA: Insufficient documentation

## 2018-08-09 DIAGNOSIS — M21542 Acquired clubfoot, left foot: Secondary | ICD-10-CM | POA: Diagnosis not present

## 2018-08-09 DIAGNOSIS — M216X2 Other acquired deformities of left foot: Secondary | ICD-10-CM | POA: Diagnosis not present

## 2018-08-09 DIAGNOSIS — I10 Essential (primary) hypertension: Secondary | ICD-10-CM | POA: Insufficient documentation

## 2018-08-09 DIAGNOSIS — Z7951 Long term (current) use of inhaled steroids: Secondary | ICD-10-CM | POA: Diagnosis not present

## 2018-08-09 DIAGNOSIS — M1990 Unspecified osteoarthritis, unspecified site: Secondary | ICD-10-CM | POA: Insufficient documentation

## 2018-08-09 DIAGNOSIS — E119 Type 2 diabetes mellitus without complications: Secondary | ICD-10-CM | POA: Diagnosis not present

## 2018-08-09 DIAGNOSIS — Z8261 Family history of arthritis: Secondary | ICD-10-CM | POA: Insufficient documentation

## 2018-08-09 DIAGNOSIS — Q336 Congenital hypoplasia and dysplasia of lung: Secondary | ICD-10-CM | POA: Insufficient documentation

## 2018-08-09 DIAGNOSIS — Z6841 Body Mass Index (BMI) 40.0 and over, adult: Secondary | ICD-10-CM | POA: Diagnosis not present

## 2018-08-09 DIAGNOSIS — F419 Anxiety disorder, unspecified: Secondary | ICD-10-CM | POA: Insufficient documentation

## 2018-08-09 DIAGNOSIS — Z79899 Other long term (current) drug therapy: Secondary | ICD-10-CM | POA: Diagnosis not present

## 2018-08-09 HISTORY — PX: ANKLE FUSION: SHX5718

## 2018-08-09 LAB — GLUCOSE, CAPILLARY
Glucose-Capillary: 93 mg/dL (ref 70–99)
Glucose-Capillary: 103 mg/dL — ABNORMAL HIGH (ref 70–99)

## 2018-08-09 SURGERY — ANKLE FUSION
Anesthesia: General | Site: Foot | Laterality: Left

## 2018-08-09 MED ORDER — ONDANSETRON HCL 4 MG/2ML IJ SOLN
INTRAMUSCULAR | Status: DC | PRN
  Administered 2018-08-09: 4 mg via INTRAVENOUS

## 2018-08-09 MED ORDER — OXYCODONE-ACETAMINOPHEN 5-325 MG PO TABS: 1.0000 | ORAL_TABLET | ORAL | 0 refills | Status: DC | PRN

## 2018-08-09 MED ORDER — HYDROMORPHONE HCL 1 MG/ML IJ SOLN
0.2500 mg | INTRAMUSCULAR | Status: DC | PRN
  Administered 2018-08-09: 0.5 mg via INTRAVENOUS

## 2018-08-09 MED ORDER — FENTANYL CITRATE (PF) 250 MCG/5ML IJ SOLN
INTRAMUSCULAR | Status: AC
  Filled 2018-08-09: qty 5

## 2018-08-09 MED ORDER — HYDROCODONE-ACETAMINOPHEN 5-325 MG PO TABS: 1.0000 | ORAL_TABLET | ORAL | 0 refills | Status: DC | PRN

## 2018-08-09 MED ORDER — MIDAZOLAM HCL 2 MG/2ML IJ SOLN
INTRAMUSCULAR | Status: AC
  Filled 2018-08-09: qty 2

## 2018-08-09 MED ORDER — PROPOFOL 10 MG/ML IV BOLUS
INTRAVENOUS | Status: DC | PRN
  Administered 2018-08-09: 200 mg via INTRAVENOUS

## 2018-08-09 MED ORDER — CHLORHEXIDINE GLUCONATE 4 % EX LIQD: 60.0000 mL | Freq: Once | CUTANEOUS | Status: DC

## 2018-08-09 MED ORDER — LIDOCAINE 2% (20 MG/ML) 5 ML SYRINGE
INTRAMUSCULAR | Status: DC | PRN
  Administered 2018-08-09: 100 mg via INTRAVENOUS

## 2018-08-09 MED ORDER — PROMETHAZINE HCL 25 MG/ML IJ SOLN: 6.2500 mg | INTRAMUSCULAR | Status: DC | PRN

## 2018-08-09 MED ORDER — FENTANYL CITRATE (PF) 100 MCG/2ML IJ SOLN
INTRAMUSCULAR | Status: DC | PRN
  Administered 2018-08-09 (×3): 50 ug via INTRAVENOUS

## 2018-08-09 MED ORDER — LIDOCAINE 2% (20 MG/ML) 5 ML SYRINGE
INTRAMUSCULAR | Status: AC
  Filled 2018-08-09: qty 5

## 2018-08-09 MED ORDER — ONDANSETRON HCL 4 MG/2ML IJ SOLN
INTRAMUSCULAR | Status: AC
  Filled 2018-08-09: qty 2

## 2018-08-09 MED ORDER — HYDROMORPHONE HCL 1 MG/ML IJ SOLN
INTRAMUSCULAR | Status: AC
  Administered 2018-08-09: 10:00:00 0.5 mg via INTRAVENOUS
  Filled 2018-08-09: qty 1

## 2018-08-09 MED ORDER — 0.9 % SODIUM CHLORIDE (POUR BTL) OPTIME
TOPICAL | Status: DC | PRN
  Administered 2018-08-09: 1000 mL

## 2018-08-09 MED ORDER — LACTATED RINGERS IV SOLN
INTRAVENOUS | Status: DC
  Administered 2018-08-09: 08:00:00 via INTRAVENOUS

## 2018-08-09 MED ORDER — OXYCODONE HCL 5 MG/5ML PO SOLN: 5.0000 mg | Freq: Once | ORAL | Status: DC | PRN

## 2018-08-09 MED ORDER — OXYCODONE HCL 5 MG PO TABS: 5.0000 mg | ORAL_TABLET | Freq: Once | ORAL | Status: DC | PRN

## 2018-08-09 SURGICAL SUPPLY — 53 items
BANDAGE ESMARK 6X9 LF (GAUZE/BANDAGES/DRESSINGS) ×1 IMPLANT
BIT DRILL CANN LNG FLUTE 3.0 (BIT) IMPLANT
BIT DRILL CANNULATED 3.0 (BIT) ×3
BIT DRILL SOLID LONG 5.5 (BIT) ×2 IMPLANT
BLADE AVERAGE 25MMX9MM (BLADE) ×1
BLADE AVERAGE 25X9 (BLADE) ×2 IMPLANT
BLADE SURG 10 STRL SS (BLADE) ×2 IMPLANT
BNDG CMPR 9X6 STRL LF SNTH (GAUZE/BANDAGES/DRESSINGS) ×1
BNDG COHESIVE 4X5 TAN STRL (GAUZE/BANDAGES/DRESSINGS) ×3 IMPLANT
BNDG ESMARK 6X9 LF (GAUZE/BANDAGES/DRESSINGS) ×3
BNDG GAUZE ELAST 4 BULKY (GAUZE/BANDAGES/DRESSINGS) ×6 IMPLANT
COVER MAYO STAND STRL (DRAPES) ×2 IMPLANT
COVER SURGICAL LIGHT HANDLE (MISCELLANEOUS) ×6 IMPLANT
COVER WAND RF STERILE (DRAPES) ×1 IMPLANT
DRAPE OEC MINIVIEW 54X84 (DRAPES) IMPLANT
DRAPE U-SHAPE 47X51 STRL (DRAPES) ×3 IMPLANT
DRSG ADAPTIC 3X8 NADH LF (GAUZE/BANDAGES/DRESSINGS) ×3 IMPLANT
DURAPREP 26ML APPLICATOR (WOUND CARE) ×3 IMPLANT
ELECT REM PT RETURN 9FT ADLT (ELECTROSURGICAL) ×3
ELECTRODE REM PT RTRN 9FT ADLT (ELECTROSURGICAL) ×1 IMPLANT
GAUZE SPONGE 4X4 12PLY STRL (GAUZE/BANDAGES/DRESSINGS) ×3 IMPLANT
GLOVE BIOGEL PI IND STRL 9 (GLOVE) ×1 IMPLANT
GLOVE BIOGEL PI INDICATOR 9 (GLOVE) ×2
GLOVE SURG ORTHO 9.0 STRL STRW (GLOVE) ×3 IMPLANT
GOWN STRL REUS W/ TWL LRG LVL3 (GOWN DISPOSABLE) ×1 IMPLANT
GOWN STRL REUS W/ TWL XL LVL3 (GOWN DISPOSABLE) ×1 IMPLANT
GOWN STRL REUS W/TWL LRG LVL3 (GOWN DISPOSABLE) ×3
GOWN STRL REUS W/TWL XL LVL3 (GOWN DISPOSABLE) ×3
KIT BASIN OR (CUSTOM PROCEDURE TRAY) ×3 IMPLANT
KIT DRSG PREVENA PLUS 7DAY 125 (MISCELLANEOUS) ×2 IMPLANT
KIT PREVENA INCISION MGT 13 (CANNISTER) ×2 IMPLANT
KIT TURNOVER KIT B (KITS) ×3 IMPLANT
NS IRRIG 1000ML POUR BTL (IV SOLUTION) ×3 IMPLANT
PACK ORTHO EXTREMITY (CUSTOM PROCEDURE TRAY) ×3 IMPLANT
PAD ARMBOARD 7.5X6 YLW CONV (MISCELLANEOUS) ×6 IMPLANT
PLATE ANT FUS ANKLE TT LT (Plate) ×2 IMPLANT
SCREW BONE TI LP 4.5X24 (Screw) ×2 IMPLANT
SCREW BONE TI LP 4.5X30 (Screw) ×4 IMPLANT
SCREW BONE TI LP 4.5X50 (Screw) ×2 IMPLANT
SCREW BONE TI LP 4.5X55 (Screw) ×2 IMPLANT
SCREW CANN LP TI 5.5X80 (Screw) ×2 IMPLANT
SCREW CANN LP TI 5X5X100 (Screw) ×2 IMPLANT
SCREW LOW PROFILE 4.5X28 (Screw) ×2 IMPLANT
SCREW LOW PROFILE TI 4.5X26 (Screw) ×2 IMPLANT
SPONGE LAP 18X18 RF (DISPOSABLE) ×3 IMPLANT
SUCTION FRAZIER HANDLE 10FR (MISCELLANEOUS) ×2
SUCTION TUBE FRAZIER 10FR DISP (MISCELLANEOUS) ×1 IMPLANT
SUT ETHILON 2 0 PSLX (SUTURE) ×9 IMPLANT
TOWEL OR 17X24 6PK STRL BLUE (TOWEL DISPOSABLE) ×3 IMPLANT
TOWEL OR 17X26 10 PK STRL BLUE (TOWEL DISPOSABLE) ×3 IMPLANT
TUBE CONNECTING 12'X1/4 (SUCTIONS) ×1
TUBE CONNECTING 12X1/4 (SUCTIONS) ×2 IMPLANT
WATER STERILE IRR 1000ML POUR (IV SOLUTION) ×3 IMPLANT

## 2018-08-09 NOTE — Transfer of Care (Signed)
Immediate Anesthesia Transfer of Care Note  Patient: Jamie Frost  Procedure(s) Performed: LEFT TIBIO-CALCANEAL FUSION (Left Foot)  Patient Location: PACU  Anesthesia Type:General  Level of Consciousness: awake, alert  and oriented  Airway & Oxygen Therapy: Patient Spontanous Breathing and Patient connected to face mask oxygen  Post-op Assessment: Report given to RN and Post -op Vital signs reviewed and stable  Post vital signs: Reviewed and stable  Last Vitals:  Vitals Value Taken Time  BP 131/96 08/09/2018  9:57 AM  Temp    Pulse 76 08/09/2018 10:02 AM  Resp 11 08/09/2018 10:02 AM  SpO2 100 % 08/09/2018 10:02 AM  Vitals shown include unvalidated device data.  Last Pain:  Vitals:   08/09/18 0957  TempSrc:   PainSc: (P) 9          Complications: No apparent anesthesia complications

## 2018-08-09 NOTE — Op Note (Signed)
08/09/2018  9:52 AM  PATIENT:  [name]    PRE-OPERATIVE DIAGNOSIS:  Cavovarus Equinus Deformity Left Ankle  POST-OPERATIVE DIAGNOSIS:  Same  PROCEDURE:  LEFT TIBIO-CALCANEAL FUSION C arm fluoroscopy utilized to verify reduction  SURGEON:  Nadara Mustard, MD  PHYSICIAN ASSISTANT:None ANESTHESIA:   General  PREOPERATIVE INDICATIONS:  Jamie Frost is a  43 y.o. male with a diagnosis of Cavovarus Equinus Deformity Left Ankle who failed conservative measures and elected for surgical management.    The risks benefits and alternatives were discussed with the patient preoperatively including but not limited to the risks of infection, bleeding, nerve injury, cardiopulmonary complications, the need for revision surgery, among others, and the patient was willing to proceed.  OPERATIVE IMPLANTS: Anterior Arthrex plate  @ENCIMAGES @  OPERATIVE FINDINGS: Plantigrade foot with correction of varus alignment and equinus contracture  OPERATIVE PROCEDURE: Patient was brought the operating room underwent a general anesthetic.  After adequate levels anesthesia were obtained patient's left lower extremity was prepped using DuraPrep draped into a sterile field a timeout was called.  An anterior incision was made between the EHL and anterior tibial tendon.  This was carried down to bone and subperiosteal dissection was performed to identify the tibial talar joint.  Using a oscillating saw a curette and rondure equinus and varus collapse was corrected..  The joint was reduced and patient had a plantigrade foot at 90 degrees.  The anterior plate was applied for distal compression screws were placed.  One proximal compression screw was placed in the dynamic slot.  C arm fluoroscopy verified reduction the homerun screw was then placed from the tibia to the calcaneus this was 100 mm in length.  The remainder 2 compression screws were placed proximally.  C-arm fluoroscopy verify alignment in both AP and  lateral planes.  The wound was irrigated with normal saline incision was closed using 2-0 nylon a Praveena customizable wound VAC was applied.   DISCHARGE PLANNING:  Antibiotic duration: Preoperative antibiotics  Weightbearing: Touchdown weightbearing on the left  Pain medication: Prescription for Vicodin  Dressing care/ Wound VAC: Continue wound VAC for 1 week  Ambulatory devices: Walker or crutches  Discharge to: Home.  Follow-up: In the office 1 week post operative.

## 2018-08-09 NOTE — Anesthesia Postprocedure Evaluation (Signed)
Anesthesia Post Note  Patient: Jamie Frost  Procedure(s) Performed: LEFT TIBIO-CALCANEAL FUSION (Left Foot)     Patient location during evaluation: PACU Anesthesia Type: General Level of consciousness: awake and alert Pain management: pain level controlled Vital Signs Assessment: post-procedure vital signs reviewed and stable Respiratory status: spontaneous breathing, nonlabored ventilation and respiratory function stable Cardiovascular status: blood pressure returned to baseline and stable Postop Assessment: no apparent nausea or vomiting Anesthetic complications: no    Last Vitals:  Vitals:   08/09/18 1015 08/09/18 1030  BP: (!) 152/99 (!) 145/102  Pulse: 86 81  Resp: 17 17  Temp:    SpO2: 100% 100%    Last Pain:  Vitals:   08/09/18 1030  TempSrc:   PainSc: 0-No pain                 Lowella Curb

## 2018-08-09 NOTE — Progress Notes (Signed)
Orthopedic Tech Progress Note Patient Details:  Jamie Frost Apr 16, 1975 [id number]  Ortho Devices Type of Ortho Device: CAM walker Ortho Device/Splint Location: LLE Ortho Device/Splint Interventions: Adjustment, Application, Ordered   Post Interventions Patient Tolerated: Well Instructions Provided: Care of device, Adjustment of device   Donald Pore 08/09/2018, 10:41 AM

## 2018-08-09 NOTE — Anesthesia Procedure Notes (Signed)
Procedure Name: LMA Insertion Date/Time: 08/09/2018 8:53 AM Performed by: Mayer Camel, CRNA Pre-anesthesia Checklist: Patient identified, Emergency Drugs available, Suction available, Patient being monitored and Timeout performed Patient Re-evaluated:Patient Re-evaluated prior to induction Oxygen Delivery Method: Circle system utilized Preoxygenation: Pre-oxygenation with 100% oxygen Induction Type: IV induction LMA: LMA inserted LMA Size: 5.0 Number of attempts: 1 Placement Confirmation: positive ETCO2 and breath sounds checked- equal and bilateral Tube secured with: Tape Dental Injury: Teeth and Oropharynx as per pre-operative assessment

## 2018-08-09 NOTE — H&P (Signed)
Jamie Frost is an 43 y.o. male.   Chief Complaint: Left foot acquired cavovarus deformity HPI: The patient is a 43 year old gentleman who has congenital deformities of both feet.  He presented to the office with his aunt who is his primary caregiver.  He had an unsteady gait and history of multiple falls.  He was using a cane at times but still had difficulty with balance despite this.  He was found to have significant cavovarus deformity left foot greater than right with equinus contracture of the left foot.  The patient presents for left tibiocalcaneal fusion today.  Past Medical History:  Diagnosis Date  . Allergic rhinitis, seasonal   . Arthritis   . Diabetes mellitus without complication (HCC)   . Hyperlipidemia   . Hypertension   . Impaired fasting glucose 12/09/2009  . Obesity   . Pulmonary hypoplasia     Past Surgical History:  Procedure Laterality Date  . bilateral inguinal hernia    . I/D abcess on back  2007   1 WEEK   . right great toe surgery      Family History  Problem Relation Age of Onset  . Obesity Mother   . Hypertension Mother   . Arthritis Mother    Social History:  reports that he has never smoked. He has never used smokeless tobacco. He reports that he does not drink alcohol or use drugs.  Allergies:  Allergies  Allergen Reactions  . Sulfonamide Derivatives     UNSPECIFIED REACTION     Medications Prior to Admission  Medication Sig Dispense Refill  . benazepril (LOTENSIN) 10 MG tablet TAKE ONE (1) TABLET EACH DAY (Patient taking differently: Take 10 mg by mouth daily. ) 90 tablet 3  . busPIRone (BUSPAR) 15 MG tablet TAKE 1/2 TABLET TWICE DAILY (Patient taking differently: Take 7.5 mg by mouth 2 (two) times daily. ) 90 tablet 1  . ergocalciferol (VITAMIN D2) 1.25 MG (50000 UT) capsule Take 1 capsule (50,000 Units total) by mouth once a week. One capsule once weekly 12 capsule 3  . fluticasone (FLONASE) 50 MCG/ACT nasal spray USE 2 SPRAYS IN EACH  NOSTRIL DAILY (Patient taking differently: Place 2 sprays into both nostrils daily. ) 48 g 0  . loratadine (CLARITIN) 10 MG tablet TAKE ONE (1) TABLET EACH DAY (Patient taking differently: Take 10 mg by mouth daily. TAKE ONE (1) TABLET EACH DAY) 90 tablet 1  . mupirocin ointment (BACTROBAN) 2 % Place 1 application into the nose daily as needed (irritation).    . potassium chloride SA (K-DUR) 20 MEQ tablet TAKE ONE (1) TABLET EACH DAY (Patient taking differently: Take 20 mEq by mouth daily. TAKE ONE (1) TABLET EACH DAY) 90 tablet 1  . simvastatin (ZOCOR) 40 MG tablet TAKE ONE TABLET DAILY AT BEDTIME (Patient taking differently: Take 40 mg by mouth at bedtime. ) 90 tablet 3  . albuterol (PROVENTIL) (2.5 MG/3ML) 0.083% nebulizer solution Take 3 mLs (2.5 mg total) by nebulization every 6 (six) hours as needed. One vial per nebulizer every 6 to 8 hours prn (Patient not taking: Reported on 08/05/2018) 75 mL 4  . Elite Nebulizer System MISC by Does not apply route.        Results for orders placed or performed during the hospital encounter of 08/09/18 (from the past 48 hour(s))  Glucose, capillary     Status: None   Collection Time: 08/09/18  7:25 AM  Result Value Ref Range   Glucose-Capillary 93 70 - 99  mg/dL   No results found.  Review of Systems  All other systems reviewed and are negative.   Blood pressure 134/63, pulse 76, temperature 97.8 F (36.6 C), temperature source Oral, resp. rate 20, height 5\' 8"  (1.727 m), weight 126.8 kg, SpO2 100 %. Physical Exam  Constitutional: He is oriented to person, place, and time. He appears well-developed and well-nourished. No distress.  HENT:  Head: Normocephalic and atraumatic.  Neck: No tracheal deviation present. No thyromegaly present.  Cardiovascular: Normal rate.  Respiratory: Effort normal. No stridor. No respiratory distress.  GI: Soft. He exhibits no distension.  Musculoskeletal:     Comments: On examination patient ambulates on the lateral  side of the left foot with the equinus contracture and cavovarus deformity.  Patient's right foot is plantigrade.  Patient has a good palpable dorsalis pedis pulse bilaterally.  With manipulation I can get the right foot to a plantigrade position with the ankle at 90 degrees.  This should be amenable to a brace.  The left foot has a fixed equinus contracture of the hindfoot with dorsiflexion 30 degrees short of neutral.  There is also a fixed equinovarus contracture through the midfoot as well.  Patient does have weakness in both lower extremities and with the fixed contracture on the left foot he does not have dorsiflexion strength.  Patient has dorsiflexion strength on the right is 4 out of 5.  Good plantar flexion strength bilaterally.  Neurological: He is alert and oriented to person, place, and time. No cranial nerve deficit.  Skin: Skin is warm.  Psychiatric: He has a normal mood and affect. His behavior is normal. Judgment and thought content normal.     Assessment/Plan Cavovarus deformity of foot, acquired, left side-plan left tibiocalcaneal fusion- the procedure and possible risks and benefits including the risk of bleeding, infection, neurovascular injury, and possible need for further surgery were discussed with the patient and the patient's questions answered to his satisfaction.  The patient wishes to proceed with surgery at this time.  Lazaro ArmsSHAWN Karmelo Bass, PA-C 08/09/2018, 7:33 AM [facility] [phone]

## 2018-08-09 NOTE — Anesthesia Preprocedure Evaluation (Signed)
Anesthesia Evaluation  Patient identified by MRN, date of birth, ID band Patient awake    Reviewed: Allergy & Precautions, NPO status , Patient's Chart, lab work & pertinent test results  Airway Mallampati: II  TM Distance: >3 FB Neck ROM: Full    Dental no notable dental hx.    Pulmonary neg pulmonary ROS,    Pulmonary exam normal breath sounds clear to auscultation       Cardiovascular hypertension, Pt. on medications negative cardio ROS Normal cardiovascular exam Rhythm:Regular Rate:Normal     Neuro/Psych Anxiety negative neurological ROS  negative psych ROS   GI/Hepatic negative GI ROS, Neg liver ROS,   Endo/Other  diabetes, Type obesity  Renal/GU negative Renal ROS  negative genitourinary   Musculoskeletal  (+) Arthritis , Osteoarthritis,    Abdominal (+) + obese,   Peds negative pediatric ROS (+)  Hematology negative hematology ROS (+)   Anesthesia Other Findings   Reproductive/Obstetrics negative OB ROS                             Anesthesia Physical Anesthesia Plan  ASA: III  Anesthesia Plan: General   Post-op Pain Management:    Induction: Intravenous  PONV Risk Score and Plan: 2 and Ondansetron, Midazolam and Treatment may vary due to age or medical condition  Airway Management Planned: LMA  Additional Equipment:   Intra-op Plan:   Post-operative Plan: Extubation in OR  Informed Consent: I have reviewed the patients History and Physical, chart, labs and discussed the procedure including the risks, benefits and alternatives for the proposed anesthesia with the patient or authorized representative who has indicated his/her understanding and acceptance.     Dental advisory given  Plan Discussed with: CRNA  Anesthesia Plan Comments:         Anesthesia Quick Evaluation

## 2018-08-12 ENCOUNTER — Encounter (HOSPITAL_COMMUNITY): Payer: Self-pay | Admitting: Orthopedic Surgery

## 2018-08-14 ENCOUNTER — Telehealth: Payer: Self-pay | Admitting: Orthopedic Surgery

## 2018-08-14 ENCOUNTER — Other Ambulatory Visit: Payer: Self-pay

## 2018-08-14 NOTE — Telephone Encounter (Signed)
Patient Aunt called and stated that patient needs a letter faxed to  [employer] fax#[phone] stating patient has to get leg dressed because he has a wound vac. This will letter will get a gas voucher for patient to get to appt.   Please send asap. If any questions call [phone]

## 2018-08-14 NOTE — Telephone Encounter (Signed)
Letter written and faxed as pt requested. appt on 08/16/18 at 12:30

## 2018-08-15 ENCOUNTER — Other Ambulatory Visit: Payer: Self-pay

## 2018-08-15 ENCOUNTER — Telehealth: Payer: Self-pay | Admitting: Orthopedic Surgery

## 2018-08-15 NOTE — Telephone Encounter (Signed)
I called and sw pt's aunt to advise that letter had been updated and faxed to [phone]. Pt has an appt tomorrow

## 2018-08-15 NOTE — Telephone Encounter (Signed)
Patient's aunt Inez Catalina called back stating that the note needing to say that the appointment could not be rescheduled. Inez Catalina said everything else on the note was fine just needed to add that the appointment could not be changed. The number to contact Inez Catalina is [phone]

## 2018-08-16 ENCOUNTER — Ambulatory Visit (INDEPENDENT_AMBULATORY_CARE_PROVIDER_SITE_OTHER): Payer: Medicare Other | Admitting: Physician Assistant

## 2018-08-16 ENCOUNTER — Other Ambulatory Visit: Payer: Self-pay

## 2018-08-16 ENCOUNTER — Encounter: Payer: Self-pay | Admitting: Physician Assistant

## 2018-08-16 VITALS — Ht 68.0 in | Wt 279.5 lb

## 2018-08-16 DIAGNOSIS — M216X2 Other acquired deformities of left foot: Secondary | ICD-10-CM

## 2018-08-16 DIAGNOSIS — Z981 Arthrodesis status: Secondary | ICD-10-CM

## 2018-08-16 DIAGNOSIS — G801 Spastic diplegic cerebral palsy: Secondary | ICD-10-CM

## 2018-08-16 DIAGNOSIS — Z6841 Body Mass Index (BMI) 40.0 and over, adult: Secondary | ICD-10-CM

## 2018-08-16 NOTE — Progress Notes (Signed)
Office Visit Note   Patient: Jamie Frost           Date of Birth: April 25, 1975           MRN: [id number] Visit Date: 08/16/2018              Requested by: Kerri PerchesSimpson, Margaret E, MD [address], [address] [place],  [place] [id number] PCP: Kerri PerchesSimpson, Margaret E, MD  Chief Complaint  Patient presents with  . Left Ankle - Routine Post Op    08/09/2018 left tibia-calcaneous  . Left Foot - Routine Post Op      HPI: The patient is a 43 yo gentleman here with his aunt for post operative follow up of a left ankle tibio-calcaneal fusion for cavovarus equinus deformity with incisional VAC placement 08/09/2018. They report the VAC canister was full and drainage started coming out around the drape earlier today. He reports minimal pain and the aunt reports the pain medication did make the patient a little confused.  The aunt reports he has been keeping the foot elevated and is only bearing partial weight when getting up to the bathroom.   Assessment & Plan: Visit Diagnoses:  1. S/P ankle fusion   2. Cavovarus deformity of foot, acquired, left   3. Spastic diplegic cerebral palsy (HCC)   4. Body mass index 40.0-44.9, adult (HCC)     Plan: We discussed elevation of the left leg higher than the level of his heart as much as possible. Continue non weight bearing on the left foot and continue fracture boot. They may wash the foot with soap and water and apply a dry gauze dressing and Ace wrap daily.  Follow up next Tuesday for recheck of incision and edema.   Follow-Up Instructions: Return in about 4 days (around 08/20/2018).   Ortho Exam  Patient is alert, oriented, no adenopathy, well-dressed, normal affect, normal respiratory effort. The Prevena incisional VAC was removed. The patient has a large amount of edema and the incision/sutures are under tension. There is serosanguineous drainage from the incision without signs of infection or cellulitis of the area. Palpable pedal pulse.   Imaging:  No results found. No images are attached to the encounter.  Labs: Lab Results  Component Value Date   HGBA1C 5.7 (H) 07/04/2018   HGBA1C 5.9 (H) 01/01/2018   HGBA1C 5.6 12/13/2016     Lab Results  Component Value Date   ALBUMIN 4.2 07/04/2018   ALBUMIN 4.1 08/09/2016   ALBUMIN 4.3 01/18/2016    Body mass index is 42.5 kg/m.  Orders:  No orders of the defined types were placed in this encounter.  No orders of the defined types were placed in this encounter.    Procedures: No procedures performed  Clinical Data: No additional findings.  ROS:  All other systems negative, except as noted in the HPI. Review of Systems  Objective: Vital Signs: Ht 5\' 8"  (1.727 m)   Wt 279 lb 8.6 oz (126.8 kg)   BMI 42.50 kg/m   Specialty Comments:  No specialty comments available.  PMFS History: Patient Active Problem List   Diagnosis Date Noted  . Acquired equinovarus deformity of left foot   . Body mass index 40.0-44.9, adult (HCC) 07/16/2018  . Spastic diplegic cerebral palsy (HCC) 07/16/2018  . Equinus contracture of left ankle 07/16/2018  . Cavovarus deformity of foot, acquired, left 07/16/2018  . Recurrent falls while walking 07/10/2018  . Prediabetes 08/12/2016  . Pulmonary hypoplasia 09/02/2010  .  GENERALIZED ANXIETY DISORDER 10/11/2008  . Hyperlipidemia LDL goal <100 09/23/2007  . Morbid obesity ([place]) 09/23/2007  . Essential hypertension 09/23/2007  . ALLERGIC RHINITIS, SEASONAL 09/23/2007   Past Medical History:  Diagnosis Date  . Allergic rhinitis, seasonal   . Arthritis   . Diabetes mellitus without complication ([place])   . Hyperlipidemia   . Hypertension   . Impaired fasting glucose 12/09/2009  . Obesity   . Pulmonary hypoplasia     Family History  Problem Relation Age of Onset  . Obesity Mother   . Hypertension Mother   . Arthritis Mother     Past Surgical History:  Procedure Laterality Date  . ANKLE FUSION Left 08/09/2018   Procedure: LEFT  TIBIO-CALCANEAL FUSION;  Surgeon: Newt Minion, MD;  Location: [facility];  Service: Orthopedics;  Laterality: Left;  . bilateral inguinal hernia    . I/D abcess on back  2007   1 WEEK   . right great toe surgery     Social History   Occupational History  . Not on file  Tobacco Use  . Smoking status: Never Smoker  . Smokeless tobacco: Never Used  Substance and Sexual Activity  . Alcohol use: No  . Drug use: No  . Sexual activity: Never

## 2018-08-20 ENCOUNTER — Other Ambulatory Visit: Payer: Self-pay

## 2018-08-20 ENCOUNTER — Encounter: Payer: Self-pay | Admitting: Physician Assistant

## 2018-08-20 ENCOUNTER — Ambulatory Visit (INDEPENDENT_AMBULATORY_CARE_PROVIDER_SITE_OTHER): Payer: Medicare Other | Admitting: Physician Assistant

## 2018-08-20 VITALS — Ht 68.0 in | Wt 279.0 lb

## 2018-08-20 DIAGNOSIS — Z6841 Body Mass Index (BMI) 40.0 and over, adult: Secondary | ICD-10-CM

## 2018-08-20 DIAGNOSIS — Z981 Arthrodesis status: Secondary | ICD-10-CM

## 2018-08-20 DIAGNOSIS — G801 Spastic diplegic cerebral palsy: Secondary | ICD-10-CM

## 2018-08-20 DIAGNOSIS — M216X2 Other acquired deformities of left foot: Secondary | ICD-10-CM

## 2018-08-21 ENCOUNTER — Encounter: Payer: Self-pay | Admitting: Physician Assistant

## 2018-08-21 NOTE — Progress Notes (Signed)
Office Visit Note   Patient: Jamie Frost           Date of Birth: February 17, 1976           MRN: [id number] Visit Date: 08/20/2018              Requested by: Fayrene Helper, MD [address] [place],  [place] [address] PCP: Fayrene Helper, MD  Chief Complaint  Patient presents with  . Left Ankle - Routine Post Op    08/09/2018 left tibial calcaneal fusion       HPI: The patient is a 43 year old gentleman here with his aunt for postoperative follow-up following a left ankle tibiocalcaneal fusion for cavovarus equinus deformity on 08/09/2018.  [facility] had to be removed several days after surgery due to full canister and drainage around the incisional area.  He has been trying to elevate and offload the foot as much as possible.  He is not having much pain and is occasionally taking half a pain pill.  Assessment & Plan: Visit Diagnoses:  1. S/P ankle fusion   2. Cavovarus deformity of foot, acquired, left   3. Spastic diplegic cerebral palsy ([place])   4. Body mass index 40.0-44.9, adult (HCC)     Plan: Continue sutures.  Continue to elevate and offload the left foot as much as possible.  Continue fracture boot when out of bed.  Follow-up in 1 week.  Follow-Up Instructions: Return in about 1 week (around 08/27/2018).   Ortho Exam  Patient is alert, oriented, no adenopathy, well-dressed, normal affect, normal respiratory effort. The patient continues to have significant edema about the left ankle and foot but it is much improved from his previous visit.  The sutures remain under tension.  There are no signs of cellulitis or infection.  He has palpable pedal pulses.  He will continue nonweightbearing in his fracture boot and elevate as much as possible.  Imaging: No results found. No images are attached to the encounter.  Labs: Lab Results  Component Value Date   HGBA1C 5.7 (H) 07/04/2018   HGBA1C 5.9 (H) 01/01/2018   HGBA1C 5.6 12/13/2016     Lab  Results  Component Value Date   ALBUMIN 4.2 07/04/2018   ALBUMIN 4.1 08/09/2016   ALBUMIN 4.3 01/18/2016    Body mass index is 42.42 kg/m.  Orders:  No orders of the defined types were placed in this encounter.  No orders of the defined types were placed in this encounter.    Procedures: No procedures performed  Clinical Data: No additional findings.  ROS:  All other systems negative, except as noted in the HPI. Review of Systems  Objective: Vital Signs: Ht 5\' 8"  (1.727 m)   Wt 279 lb (126.6 kg)   BMI 42.42 kg/m   Specialty Comments:  No specialty comments available.  PMFS History: Patient Active Problem List   Diagnosis Date Noted  . Acquired equinovarus deformity of left foot   . Body mass index 40.0-44.9, adult ([place]) 07/16/2018  . Spastic diplegic cerebral palsy ([place]) 07/16/2018  . Equinus contracture of left ankle 07/16/2018  . Cavovarus deformity of foot, acquired, left 07/16/2018  . Recurrent falls while walking 07/10/2018  . Prediabetes 08/12/2016  . Pulmonary hypoplasia 09/02/2010  . GENERALIZED ANXIETY DISORDER 10/11/2008  . Hyperlipidemia LDL goal <100 09/23/2007  . Morbid obesity ([place]) 09/23/2007  . Essential hypertension 09/23/2007  . ALLERGIC RHINITIS, SEASONAL 09/23/2007   Past Medical History:  Diagnosis  Date  . Allergic rhinitis, seasonal   . Arthritis   . Diabetes mellitus without complication (HCC)   . Hyperlipidemia   . Hypertension   . Impaired fasting glucose 12/09/2009  . Obesity   . Pulmonary hypoplasia     Family History  Problem Relation Age of Onset  . Obesity Mother   . Hypertension Mother   . Arthritis Mother     Past Surgical History:  Procedure Laterality Date  . ANKLE FUSION Left 08/09/2018   Procedure: LEFT TIBIO-CALCANEAL FUSION;  Surgeon: Nadara Mustarduda, Marcus V, MD;  [facility];  Service: Orthopedics;  Laterality: Left;  . bilateral inguinal hernia    . I/D abcess on back  2007   1 WEEK   . right great toe  surgery     Social History   Occupational History  . Not on file  Tobacco Use  . Smoking status: Never Smoker  . Smokeless tobacco: Never Used  Substance and Sexual Activity  . Alcohol use: No  . Drug use: No  . Sexual activity: Never

## 2018-08-22 ENCOUNTER — Encounter: Payer: Self-pay | Admitting: Family Medicine

## 2018-08-22 ENCOUNTER — Ambulatory Visit (INDEPENDENT_AMBULATORY_CARE_PROVIDER_SITE_OTHER): Payer: Medicare Other | Admitting: Family Medicine

## 2018-08-22 ENCOUNTER — Other Ambulatory Visit: Payer: Self-pay

## 2018-08-22 VITALS — BP 118/78 | HR 88 | Resp 12 | Ht 68.0 in | Wt 279.0 lb

## 2018-08-22 DIAGNOSIS — Z0000 Encounter for general adult medical examination without abnormal findings: Secondary | ICD-10-CM | POA: Diagnosis not present

## 2018-08-22 NOTE — Progress Notes (Signed)
Subjective:   Jamie Frost is a 43 y.o. male who presents for Medicare Annual/Subsequent preventive examination.  Location of Patient: Home Location of Provider: Telehealth Consent was obtain for visit to be over via telehealth.  I verified that I am speaking with the correct person using two identifiers.   Review of Systems:    Cardiac Risk Factors include: dyslipidemia;hypertension;obesity (BMI >30kg/m2)     Objective:    Vitals: BP 118/78   Pulse 88   Resp 12   Ht 5\' 8"  (1.727 m)   Wt 279 lb (126.6 kg)   BMI 42.42 kg/m   Body mass index is 42.42 kg/m.  Advanced Directives 08/07/2018 08/20/2017  Does Patient Have a Medical Advance Directive? No Yes  Type of Advance Directive - (No Data)  Does patient want to make changes to medical advance directive? - No - Patient declined  Would patient like information on creating a medical advance directive? No - Patient declined -    Tobacco Social History   Tobacco Use  Smoking Status Never Smoker  Smokeless Tobacco Never Used     Counseling given: Yes   Clinical Intake:  Pre-visit preparation completed: Yes  Pain : No/denies pain Pain Score: 0-No pain     BMI - recorded: 42.42 Nutritional Risks: None Diabetes: No  How often do you need to have someone help you when you read instructions, pamphlets, or other written materials from your doctor or pharmacy?: 3 - Sometimes What is the last grade level you completed in school?: 12  Interpreter Needed?: No     Past Medical History:  Diagnosis Date  . Allergic rhinitis, seasonal   . Arthritis   . Diabetes mellitus without complication (HCC)   . Hyperlipidemia   . Hypertension   . Impaired fasting glucose 12/09/2009  . Obesity   . Pulmonary hypoplasia    Past Surgical History:  Procedure Laterality Date  . ANKLE FUSION Left 08/09/2018   Procedure: LEFT TIBIO-CALCANEAL FUSION;  Surgeon: Jamie Frost, Jamie V, MD;  [facility];  Service: Orthopedics;   Laterality: Left;  . bilateral inguinal hernia    . I/D abcess on back  2007   1 WEEK   . right great toe surgery     Family History  Problem Relation Age of Onset  . Obesity Mother   . Hypertension Mother   . Arthritis Mother    Social History   Socioeconomic History  . Marital status: Single    Spouse name: Not on file  . Number of children: Not on file  . Years of education: Not on file  . Highest education level: Not on file  Occupational History  . Not on file  Social Needs  . Financial resource strain: Not hard at all  . Food insecurity    Worry: Never true    Inability: Never true  . Transportation needs    Medical: No    Non-medical: No  Tobacco Use  . Smoking status: Never Smoker  . Smokeless tobacco: Never Used  Substance and Sexual Activity  . Alcohol use: No  . Drug use: No  . Sexual activity: Never  Lifestyle  . Physical activity    Days per week: 2 days    Minutes per session: 10 min  . Stress: Not at all  Relationships  . Social Musicianconnections    Talks on phone: Twice a week    Gets together: Twice a week    Attends religious service: Never  Active member of club or organization: No    Attends meetings of clubs or organizations: Never    Relationship status: Never married  Other Topics Concern  . Not on file  Social History Narrative   He enjoys reading the bible during his free time.     Outpatient Encounter Medications as of 08/22/2018  Medication Sig  . albuterol (PROVENTIL) (2.5 MG/3ML) 0.083% nebulizer solution Take 3 mLs (2.5 mg total) by nebulization every 6 (six) hours as needed. One vial per nebulizer every 6 to 8 hours prn  . benazepril (LOTENSIN) 10 MG tablet TAKE ONE (1) TABLET EACH DAY (Patient taking differently: Take 10 mg by mouth daily. )  . busPIRone (BUSPAR) 15 MG tablet TAKE 1/2 TABLET TWICE DAILY (Patient taking differently: Take 7.5 mg by mouth 2 (two) times daily. )  . Elite Nebulizer System MISC by Does not apply route.     . ergocalciferol (VITAMIN D2) 1.25 MG (50000 UT) capsule Take 1 capsule (50,000 Units total) by mouth once a week. One capsule once weekly  . fluticasone (FLONASE) 50 MCG/ACT nasal spray USE 2 SPRAYS IN EACH NOSTRIL DAILY (Patient taking differently: Place 2 sprays into both nostrils daily. )  . HYDROcodone-acetaminophen (NORCO/VICODIN) 5-325 MG tablet Take 1 tablet by mouth every 4 (four) hours as needed for moderate pain.  Marland Kitchen. loratadine (CLARITIN) 10 MG tablet TAKE ONE (1) TABLET EACH DAY (Patient taking differently: Take 10 mg by mouth daily. TAKE ONE (1) TABLET EACH DAY)  . mupirocin ointment (BACTROBAN) 2 % Place 1 application into the nose daily as needed (irritation).  Marland Kitchen. oxyCODONE-acetaminophen (PERCOCET/ROXICET) 5-325 MG tablet Take 1 tablet by mouth every 4 (four) hours as needed.  . potassium chloride SA (K-DUR) 20 MEQ tablet TAKE ONE (1) TABLET EACH DAY (Patient taking differently: Take 20 mEq by mouth daily. TAKE ONE (1) TABLET EACH DAY)  . simvastatin (ZOCOR) 40 MG tablet TAKE ONE TABLET DAILY AT BEDTIME (Patient taking differently: Take 40 mg by mouth at bedtime. )   No facility-administered encounter medications on file as of 08/22/2018.     Activities of Daily Living In your present state of health, do you have any difficulty performing the following activities: 08/22/2018 08/07/2018  Hearing? N -  Vision? N N  Difficulty concentrating or making decisions? Y N  Walking or climbing stairs? Y Y  Dressing or bathing? Y Y  Doing errands, shopping? Malvin JohnsY Y  Preparing Food and eating ? Y -  Using the Toilet? Y -  In the past six months, have you accidently leaked urine? Y -  Do you have problems with loss of bowel control? Y -  Managing your Medications? Y -  Managing your Finances? Y -  Housekeeping or managing your Housekeeping? Y -  Some recent data might be hidden    Patient Care Team: Jamie Frost, Jamie E, MD as PCP - General   Assessment:   This is a routine wellness  examination for Jamie Frost.  Exercise Activities and Dietary recommendations Current Exercise Habits: The patient does not participate in regular exercise at present, Exercise limited by: orthopedic condition(s)  Goals   None     Fall Risk Fall Risk  08/22/2018 07/10/2018 07/03/2018 01/01/2018 08/20/2017  Falls in the past year? 1 1 1  No No  Number falls in past yr: 1 1 1  - -  Injury with Fall? 1 0 1 - -  Risk for fall due to : - - History of fall(s);Impaired balance/gait;Impaired mobility - -  Follow up - - Falls evaluation completed;Education provided;Falls prevention discussed;Follow up appointment - -   Is the patient's home free of loose throw rugs in walkways, pet beds, electrical cords, etc?   yes      Grab bars in the bathroom? no      Handrails on the stairs?   yes      Adequate lighting?   yes     Depression Screen PHQ 2/9 Scores 08/22/2018 07/03/2018 01/01/2018 08/20/2017  PHQ - 2 Score 0 0 0 0  PHQ- 9 Score - - - -    Cognitive Function MMSE - Mini Mental State Exam 08/20/2017  Orientation to time 5  Orientation to Place 5  Registration 3  Attention/ Calculation 5  Recall 2  Language- name 2 objects 2  Language- repeat 1  Language- follow 3 step command 3  Language- read & follow direction 1  Write a sentence 1  Copy design 0  Total score 28     6CIT Screen 08/22/2018 08/20/2017  What Year? 0 points 0 points  What month? 0 points 0 points  What time? 0 points 0 points  Count back from 20 2 points 4 points  Months in reverse 4 points 4 points  Repeat phrase 2 points 4 points  Total Score 8 12    Immunization History  Administered Date(s) Administered  . Influenza Split 12/30/2010, 11/29/2011  . Influenza Whole 01/19/2007, 12/24/2008, 11/19/2009  . Influenza,inj,Quad PF,6+ Mos 12/16/2012, 12/17/2013, 12/21/2014, 12/20/2015, 12/12/2016, 01/01/2018  . Pneumococcal Polysaccharide-23 07/01/2010, 09/15/2015  . Td 12/10/2002  . Tdap 12/30/2010    Qualifies for  Shingles Vaccine?  No   Screening Tests Health Maintenance  Topic Date Due  . INFLUENZA VACCINE  10/05/2018  . TETANUS/TDAP  12/29/2020  . HIV Screening  Completed   Cancer Screenings: Lung: Low Dose CT Chest recommended if Age 14-80 years, 30 pack-year currently smoking OR have quit w/in 15years. Patient does not qualify. Colorectal: n/a-pt not old enough   Additional Screenings:   Hepatitis C Screening: Completed      Plan:      1. Encounter for Medicare annual wellness exam   I have personally reviewed and noted the following in the patient's chart:   . Medical and social history . Use of alcohol, tobacco or illicit drugs  . Current medications and supplements . Functional ability and status . Nutritional status . Physical activity . Advanced directives . List of other physicians . Hospitalizations, surgeries, and ER visits in previous 12 months . Vitals . Screenings to include cognitive, depression, and falls . Referrals and appointments  In addition, I have reviewed and discussed with patient certain preventive protocols, quality metrics, and best practice recommendations. A written personalized care plan for preventive services as well as general preventive health recommendations were provided to patient.   I provided 20 minutes of non-face-to-face time during this encounter.   Perlie Mayo, NP  08/22/2018

## 2018-08-22 NOTE — Patient Instructions (Signed)
Mr. Jamie Frost , Thank you for taking time to come for your Medicare Wellness Visit. I appreciate your ongoing commitment to your health goals. Please review the following plan we discussed and let me know if I can assist you in the future.   Screening recommendations/referrals: Colonoscopy: at 50 these will start  Recommended yearly ophthalmology/optometry visit for glaucoma screening and checkup Recommended yearly dental visit for hygiene and checkup  Vaccinations: Influenza vaccine: Due Fall 2020 Pneumococcal vaccine: Completed 23 version Tdap vaccine: Due 2022 Shingles vaccine: Does not qualify for this yet   Advanced directives: Discuss with mom   Conditions/risks identified: Falls   Next appointment: 12/10/2018  Preventive Care 40-64 Years, Male Preventive care refers to lifestyle choices and visits with your health care provider that can promote health and wellness. What does preventive care include?  A yearly physical exam. This is also called an annual well check.  Dental exams once or twice a year.  Routine eye exams. Ask your health care provider how often you should have your eyes checked.  Personal lifestyle choices, including:  Daily care of your teeth and gums.  Regular physical activity.  Eating a healthy diet.  Avoiding tobacco and drug use.  Limiting alcohol use.  Practicing safe sex.  Taking low-dose aspirin every day starting at age 44. What happens during an annual well check? The services and screenings done by your health care provider during your annual well check will depend on your age, overall health, lifestyle risk factors, and family history of disease. Counseling  Your health care provider may ask you questions about your:  Alcohol use.  Tobacco use.  Drug use.  Emotional well-being.  Home and relationship well-being.  Sexual activity.  Eating habits.  Work and work Astronomerenvironment. Screening  You may have the following tests or  measurements:  Height, weight, and BMI.  Blood pressure.  Lipid and cholesterol levels. These may be checked every 5 years, or more frequently if you are over 766 years old.  Skin check.  Lung cancer screening. You may have this screening every year starting at age 44 if you have a 30-pack-year history of smoking and currently smoke or have quit within the past 15 years.  Fecal occult blood test (FOBT) of the stool. You may have this test every year starting at age 44.  Flexible sigmoidoscopy or colonoscopy. You may have a sigmoidoscopy every 5 years or a colonoscopy every 10 years starting at age 44.  Prostate cancer screening. Recommendations will vary depending on your family history and other risks.  Hepatitis C blood test.  Hepatitis B blood test.  Sexually transmitted disease (STD) testing.  Diabetes screening. This is done by checking your blood sugar (glucose) after you have not eaten for a while (fasting). You may have this done every 1-3 years. Discuss your test results, treatment options, and if necessary, the need for more tests with your health care provider. Vaccines  Your health care provider may recommend certain vaccines, such as:  Influenza vaccine. This is recommended every year.  Tetanus, diphtheria, and acellular pertussis (Tdap, Td) vaccine. You may need a Td booster every 10 years.  Zoster vaccine. You may need this after age 44.  Pneumococcal 13-valent conjugate (PCV13) vaccine. You may need this if you have certain conditions and have not been vaccinated.  Pneumococcal polysaccharide (PPSV23) vaccine. You may need one or two doses if you smoke cigarettes or if you have certain conditions. Talk to your health care provider about  which screenings and vaccines you need and how often you need them. This information is not intended to replace advice given to you by your health care provider. Make sure you discuss any questions you have with your health care  provider. Document Released: 03/19/2015 Document Revised: 11/10/2015 Document Reviewed: 12/22/2014 Elsevier Interactive Patient Education  2017 [place] Prevention in the Home Falls can cause injuries. They can happen to people of all ages. There are many things you can do to make your home safe and to help prevent falls. What can I do on the outside of my home?  Regularly fix the edges of walkways and driveways and fix any cracks.  Remove anything that might make you trip as you walk through a door, such as a raised step or threshold.  Trim any bushes or trees on the path to your home.  Use bright outdoor lighting.  Clear any walking paths of anything that might make someone trip, such as rocks or tools.  Regularly check to see if handrails are loose or broken. Make sure that both sides of any steps have handrails.  Any raised decks and porches should have guardrails on the edges.  Have any leaves, snow, or ice cleared regularly.  Use sand or salt on walking paths during winter.  Clean up any spills in your garage right away. This includes oil or grease spills. What can I do in the bathroom?  Use night lights.  Install grab bars by the toilet and in the tub and shower. Do not use towel bars as grab bars.  Use non-skid mats or decals in the tub or shower.  If you need to sit down in the shower, use a plastic, non-slip stool.  Keep the floor dry. Clean up any water that spills on the floor as soon as it happens.  Remove soap buildup in the tub or shower regularly.  Attach bath mats securely with double-sided non-slip rug tape.  Do not have throw rugs and other things on the floor that can make you trip. What can I do in the bedroom?  Use night lights.  Make sure that you have a light by your bed that is easy to reach.  Do not use any sheets or blankets that are too big for your bed. They should not hang down onto the floor.  Have a firm chair that has  side arms. You can use this for support while you get dressed.  Do not have throw rugs and other things on the floor that can make you trip. What can I do in the kitchen?  Clean up any spills right away.  Avoid walking on wet floors.  Keep items that you use a lot in easy-to-reach places.  If you need to reach something above you, use a strong step stool that has a grab bar.  Keep electrical cords out of the way.  Do not use floor polish or wax that makes floors slippery. If you must use wax, use non-skid floor wax.  Do not have throw rugs and other things on the floor that can make you trip. What can I do with my stairs?  Do not leave any items on the stairs.  Make sure that there are handrails on both sides of the stairs and use them. Fix handrails that are broken or loose. Make sure that handrails are as long as the stairways.  Check any carpeting to make sure that it is firmly attached to the  stairs. Fix any carpet that is loose or worn.  Avoid having throw rugs at the top or bottom of the stairs. If you do have throw rugs, attach them to the floor with carpet tape.  Make sure that you have a light switch at the top of the stairs and the bottom of the stairs. If you do not have them, ask someone to add them for you. What else can I do to help prevent falls?  Wear shoes that:  Do not have high heels.  Have rubber bottoms.  Are comfortable and fit you well.  Are closed at the toe. Do not wear sandals.  If you use a stepladder:  Make sure that it is fully opened. Do not climb a closed stepladder.  Make sure that both sides of the stepladder are locked into place.  Ask someone to hold it for you, if possible.  Clearly mark and make sure that you can see:  Any grab bars or handrails.  First and last steps.  Where the edge of each step is.  Use tools that help you move around (mobility aids) if they are needed. These include:  Canes.  Walkers.  Scooters.   Crutches.  Turn on the lights when you go into a dark area. Replace any light bulbs as soon as they burn out.  Set up your furniture so you have a clear path. Avoid moving your furniture around.  If any of your floors are uneven, fix them.  If there are any pets around you, be aware of where they are.  Review your medicines with your doctor. Some medicines can make you feel dizzy. This can increase your chance of falling. Ask your doctor what other things that you can do to help prevent falls. This information is not intended to replace advice given to you by your health care provider. Make sure you discuss any questions you have with your health care provider. Document Released: 12/17/2008 Document Revised: 07/29/2015 Document Reviewed: 03/27/2014 Elsevier Interactive Patient Education  2017 [employer].

## 2018-08-27 ENCOUNTER — Encounter: Payer: Self-pay | Admitting: Physician Assistant

## 2018-08-27 ENCOUNTER — Other Ambulatory Visit: Payer: Self-pay

## 2018-08-27 ENCOUNTER — Ambulatory Visit (INDEPENDENT_AMBULATORY_CARE_PROVIDER_SITE_OTHER): Payer: Medicare Other | Admitting: Orthopedic Surgery

## 2018-08-27 ENCOUNTER — Ambulatory Visit (INDEPENDENT_AMBULATORY_CARE_PROVIDER_SITE_OTHER): Payer: Medicare Other

## 2018-08-27 VITALS — Ht 68.0 in | Wt 279.0 lb

## 2018-08-27 DIAGNOSIS — M25572 Pain in left ankle and joints of left foot: Secondary | ICD-10-CM

## 2018-08-27 DIAGNOSIS — Z981 Arthrodesis status: Secondary | ICD-10-CM

## 2018-08-28 ENCOUNTER — Encounter: Payer: Self-pay | Admitting: Orthopedic Surgery

## 2018-08-28 NOTE — Progress Notes (Signed)
Office Visit Note   Patient: Jamie Frost           Date of Birth: [birth year]           MRN: [id number] Visit Date: 08/27/2018              Requested by: Fayrene Helper, MD [address] [place],  [place] [address] PCP: Fayrene Helper, MD  Chief Complaint  Patient presents with  . Left Foot - Routine Post Op    08/09/18 left tibio calc fusion      HPI: Patient is a 43 year old gentleman who presents 3 weeks status post left tibial calcaneal fusion.  Patient denies any pain the sutures are intact he does have some increased swelling.  Assessment & Plan: Visit Diagnoses:  1. Pain in left ankle and joints of left foot   2. S/P ankle fusion     Plan: We will harvest the sutures use an Ace wrap to help with compression continue nonweightbearing on the left continue the fracture boot.  Follow-Up Instructions: Return in about 3 weeks (around 09/17/2018).   Ortho Exam  Patient is alert, oriented, no adenopathy, well-dressed, normal affect, normal respiratory effort. Examination there is no redness no cellulitis no drainage there is some swelling around the sutures but no wound dehiscence no cellulitis.  Imaging: No results found. No images are attached to the encounter.  Labs: Lab Results  Component Value Date   HGBA1C 5.7 (H) 07/04/2018   HGBA1C 5.9 (H) 01/01/2018   HGBA1C 5.6 12/13/2016     Lab Results  Component Value Date   ALBUMIN 4.2 07/04/2018   ALBUMIN 4.1 08/09/2016   ALBUMIN 4.3 01/18/2016    Body mass index is 42.42 kg/m.  Orders:  Orders Placed This Encounter  Procedures  . XR Ankle Complete Left   No orders of the defined types were placed in this encounter.    Procedures: No procedures performed  Clinical Data: No additional findings.  ROS:  All other systems negative, except as noted in the HPI. Review of Systems  Objective: Vital Signs: Ht 5\' 8"  (1.727 m)   Wt 279 lb (126.6 kg)   BMI 42.42 kg/m    Specialty Comments:  No specialty comments available.  PMFS History: Patient Active Problem List   Diagnosis Date Noted  . Acquired equinovarus deformity of left foot   . Body mass index 40.0-44.9, adult ([place]) 07/16/2018  . Spastic diplegic cerebral palsy ([place]) 07/16/2018  . Equinus contracture of left ankle 07/16/2018  . Cavovarus deformity of foot, acquired, left 07/16/2018  . Recurrent falls while walking 07/10/2018  . Prediabetes 08/12/2016  . Pulmonary hypoplasia 09/02/2010  . GENERALIZED ANXIETY DISORDER 10/11/2008  . Hyperlipidemia LDL goal <100 09/23/2007  . Morbid obesity ([place]) 09/23/2007  . Essential hypertension 09/23/2007  . ALLERGIC RHINITIS, SEASONAL 09/23/2007   Past Medical History:  Diagnosis Date  . Allergic rhinitis, seasonal   . Arthritis   . Diabetes mellitus without complication ([place])   . Hyperlipidemia   . Hypertension   . Impaired fasting glucose 12/09/2009  . Obesity   . Pulmonary hypoplasia     Family History  Problem Relation Age of Onset  . Obesity Mother   . Hypertension Mother   . Arthritis Mother     Past Surgical History:  Procedure Laterality Date  . ANKLE FUSION Left 08/09/2018   Procedure: LEFT TIBIO-CALCANEAL FUSION;  Surgeon: Newt Minion, MD;  Location: [facility];  Service:  Orthopedics;  Laterality: Left;  . bilateral inguinal hernia    . I/D abcess on back  2007   1 WEEK   . right great toe surgery     Social History   Occupational History  . Not on file  Tobacco Use  . Smoking status: Never Smoker  . Smokeless tobacco: Never Used  Substance and Sexual Activity  . Alcohol use: No  . Drug use: No  . Sexual activity: Never

## 2018-09-23 ENCOUNTER — Encounter: Payer: Self-pay | Admitting: Orthopedic Surgery

## 2018-09-23 ENCOUNTER — Ambulatory Visit (INDEPENDENT_AMBULATORY_CARE_PROVIDER_SITE_OTHER): Payer: Medicare Other | Admitting: Orthopedic Surgery

## 2018-09-23 ENCOUNTER — Other Ambulatory Visit: Payer: Self-pay

## 2018-09-23 VITALS — Ht 68.0 in | Wt 279.0 lb

## 2018-09-23 DIAGNOSIS — Z981 Arthrodesis status: Secondary | ICD-10-CM

## 2018-09-25 ENCOUNTER — Encounter: Payer: Self-pay | Admitting: Orthopedic Surgery

## 2018-09-25 NOTE — Progress Notes (Signed)
Office Visit Note   Patient: Jamie Frost           Date of Birth: [birth year]           MRN: [id number] Visit Date: 09/23/2018              Requested by: Kerri PerchesSimpson, Margaret E, MD [address], [address] [place],  [place] [id number] PCP: Kerri PerchesSimpson, Margaret E, MD  Chief Complaint  Patient presents with  . Left Foot - Routine Post Op    08/09/18 left tib-cal fusion       HPI: Patient is a 43 year old gentleman who presents approximately 6 weeks status post left tibial calcaneal fusion he is currently ambulating with a walker and a fracture boot patient is asymptomatic.  Assessment & Plan: Visit Diagnoses:  1. S/P ankle fusion     Plan: Continue with the fracture boot without restrictions patient has a shoe and brace ordered will place in the shoe and brace once these are obtained.  Repeat three-view radiographs of the left ankle at follow-up.  Follow-Up Instructions: Return in about 4 weeks (around 10/21/2018).   Ortho Exam  Patient is alert, oriented, no adenopathy, well-dressed, normal affect, normal respiratory effort. Examination the incision is well-healed his foot is plantigrade at 90 degrees no signs of infection.  Imaging: No results found. No images are attached to the encounter.  Labs: Lab Results  Component Value Date   HGBA1C 5.7 (H) 07/04/2018   HGBA1C 5.9 (H) 01/01/2018   HGBA1C 5.6 12/13/2016     Lab Results  Component Value Date   ALBUMIN 4.2 07/04/2018   ALBUMIN 4.1 08/09/2016   ALBUMIN 4.3 01/18/2016    No results found for: MG Lab Results  Component Value Date   VD25OH 22.4 (L) 07/04/2018   VD25OH 62 08/09/2016   VD25OH 39 09/24/2014    No results found for: PREALBUMIN CBC EXTENDED Latest Ref Rng & Units 08/07/2018 07/04/2018 01/01/2018  WBC 4.0 - 10.5 K/uL 9.2 9.4 11.0(H)  RBC 4.22 - 5.81 MIL/uL 4.70 4.82 5.03  HGB 13.0 - 17.0 g/dL 12.5(L) 13.0 13.4  HCT 39.0 - 52.0 % 42.1 42.6 42.8  PLT 150 - 400 K/uL 241 276 287  NEUTROABS 1.7 -  7.7 K/uL - - -  LYMPHSABS 0.7 - 4.0 K/uL - - -     Body mass index is 42.42 kg/m.  Orders:  No orders of the defined types were placed in this encounter.  No orders of the defined types were placed in this encounter.    Procedures: No procedures performed  Clinical Data: No additional findings.  ROS:  All other systems negative, except as noted in the HPI. Review of Systems  Objective: Vital Signs: Ht 5\' 8"  (1.727 m)   Wt 279 lb (126.6 kg)   BMI 42.42 kg/m   Specialty Comments:  No specialty comments available.  PMFS History: Patient Active Problem List   Diagnosis Date Noted  . Acquired equinovarus deformity of left foot   . Body mass index 40.0-44.9, adult (HCC) 07/16/2018  . Spastic diplegic cerebral palsy (HCC) 07/16/2018  . Equinus contracture of left ankle 07/16/2018  . Cavovarus deformity of foot, acquired, left 07/16/2018  . Recurrent falls while walking 07/10/2018  . Prediabetes 08/12/2016  . Pulmonary hypoplasia 09/02/2010  . GENERALIZED ANXIETY DISORDER 10/11/2008  . Hyperlipidemia LDL goal <100 09/23/2007  . Morbid obesity (HCC) 09/23/2007  . Essential hypertension 09/23/2007  . ALLERGIC RHINITIS, SEASONAL 09/23/2007   Past Medical  History:  Diagnosis Date  . Allergic rhinitis, seasonal   . Arthritis   . Diabetes mellitus without complication ([place])   . Hyperlipidemia   . Hypertension   . Impaired fasting glucose 12/09/2009  . Obesity   . Pulmonary hypoplasia     Family History  Problem Relation Age of Onset  . Obesity Mother   . Hypertension Mother   . Arthritis Mother     Past Surgical History:  Procedure Laterality Date  . ANKLE FUSION Left 08/09/2018   Procedure: LEFT TIBIO-CALCANEAL FUSION;  Surgeon: Newt Minion, MD;  Location: [facility];  Service: Orthopedics;  Laterality: Left;  . bilateral inguinal hernia    . I/D abcess on back  2007   1 WEEK   . right great toe surgery     Social History   Occupational History  . Not on  file  Tobacco Use  . Smoking status: Never Smoker  . Smokeless tobacco: Never Used  Substance and Sexual Activity  . Alcohol use: No  . Drug use: No  . Sexual activity: Never

## 2018-10-17 ENCOUNTER — Telehealth: Payer: Self-pay | Admitting: Orthopedic Surgery

## 2018-10-17 ENCOUNTER — Other Ambulatory Visit: Payer: Self-pay

## 2018-10-17 NOTE — Telephone Encounter (Signed)
Patient's aunt called requesting a note from our office stating that the patient's appointment can not be changed.  The note needs to be faxed to [facility] at [phone].  It needs to be sent today.  CB#[phone].  Thank you.

## 2018-10-17 NOTE — Telephone Encounter (Signed)
I called and sw pt's aunt to advise that letter has been faxed to the number provided for his appointment next Tuesday 10/22/18

## 2018-10-22 ENCOUNTER — Encounter: Payer: Self-pay | Admitting: Orthopedic Surgery

## 2018-10-22 ENCOUNTER — Other Ambulatory Visit: Payer: Self-pay

## 2018-10-22 ENCOUNTER — Ambulatory Visit (INDEPENDENT_AMBULATORY_CARE_PROVIDER_SITE_OTHER): Payer: Medicare Other

## 2018-10-22 ENCOUNTER — Ambulatory Visit (INDEPENDENT_AMBULATORY_CARE_PROVIDER_SITE_OTHER): Payer: Medicare Other | Admitting: Orthopedic Surgery

## 2018-10-22 VITALS — Ht 68.0 in | Wt 279.0 lb

## 2018-10-22 DIAGNOSIS — Z981 Arthrodesis status: Secondary | ICD-10-CM

## 2018-10-22 NOTE — Progress Notes (Signed)
Office Visit Note   Patient: Jamie Frost           Date of Birth: 06-10-1975           MRN: [id number] Visit Date: 10/22/2018              Requested by: Kerri PerchesSimpson, Margaret E, MD [address] [place],  [place] [id number] PCP: Kerri PerchesSimpson, Margaret E, MD  Chief Complaint  Patient presents with  . Left Foot - Routine Post Op      HPI: Patient is a 43 year old gentleman who presents 2 and half months status post left tibial calcaneal fusion with an anterior plate he is currently ambulating in a fracture boot has no complaints no concerns no pain.  Assessment & Plan: Visit Diagnoses:  1. S/P ankle fusion     Plan: Patient will discontinue the fracture boot when his shoe and braces are fabricated.  He has no restrictions at this time follow-up as needed.  Follow-Up Instructions: Return if symptoms worsen or fail to improve.   Ortho Exam  Patient is alert, oriented, no adenopathy, well-dressed, normal affect, normal respiratory effort. Examination patient has difficulty getting from a sitting to standing position he ambulates well with the fracture boot.  There is no skin defects his foot is plantigrade he uses a cane for ambulation.  Imaging: Xr Foot Complete Left  Result Date: 10/22/2018 3 view radiographs of the left ankle shows a stable tibial calcaneal fusion with complete bony union of the tibiotalar joint no hardware failure.  No images are attached to the encounter.  Labs: Lab Results  Component Value Date   HGBA1C 5.7 (H) 07/04/2018   HGBA1C 5.9 (H) 01/01/2018   HGBA1C 5.6 12/13/2016     Lab Results  Component Value Date   ALBUMIN 4.2 07/04/2018   ALBUMIN 4.1 08/09/2016   ALBUMIN 4.3 01/18/2016    No results found for: MG Lab Results  Component Value Date   VD25OH 22.4 (L) 07/04/2018   VD25OH 62 08/09/2016   VD25OH 39 09/24/2014    No results found for: PREALBUMIN CBC EXTENDED Latest Ref Rng & Units 08/07/2018 07/04/2018 01/01/2018  WBC  4.0 - 10.5 K/uL 9.2 9.4 11.0(H)  RBC 4.22 - 5.81 MIL/uL 4.70 4.82 5.03  HGB 13.0 - 17.0 g/dL 12.5(L) 13.0 13.4  HCT 39.0 - 52.0 % 42.1 42.6 42.8  PLT 150 - 400 K/uL 241 276 287  NEUTROABS 1.7 - 7.7 K/uL - - -  LYMPHSABS 0.7 - 4.0 K/uL - - -     Body mass index is 42.42 kg/m.  Orders:  Orders Placed This Encounter  Procedures  . XR Foot Complete Left   No orders of the defined types were placed in this encounter.    Procedures: No procedures performed  Clinical Data: No additional findings.  ROS:  All other systems negative, except as noted in the HPI. Review of Systems  Objective: Vital Signs: Ht 5\' 8"  (1.727 m)   Wt 279 lb (126.6 kg)   BMI 42.42 kg/m   Specialty Comments:  No specialty comments available.  PMFS History: Patient Active Problem List   Diagnosis Date Noted  . Acquired equinovarus deformity of left foot   . Body mass index 40.0-44.9, adult (HCC) 07/16/2018  . Spastic diplegic cerebral palsy (HCC) 07/16/2018  . Equinus contracture of left ankle 07/16/2018  . Cavovarus deformity of foot, acquired, left 07/16/2018  . Recurrent falls while walking 07/10/2018  . Prediabetes 08/12/2016  .  Pulmonary hypoplasia 09/02/2010  . GENERALIZED ANXIETY DISORDER 10/11/2008  . Hyperlipidemia LDL goal <100 09/23/2007  . Morbid obesity ([place]) 09/23/2007  . Essential hypertension 09/23/2007  . ALLERGIC RHINITIS, SEASONAL 09/23/2007   Past Medical History:  Diagnosis Date  . Allergic rhinitis, seasonal   . Arthritis   . Diabetes mellitus without complication ([place])   . Hyperlipidemia   . Hypertension   . Impaired fasting glucose 12/09/2009  . Obesity   . Pulmonary hypoplasia     Family History  Problem Relation Age of Onset  . Obesity Mother   . Hypertension Mother   . Arthritis Mother     Past Surgical History:  Procedure Laterality Date  . ANKLE FUSION Left 08/09/2018   Procedure: LEFT TIBIO-CALCANEAL FUSION;  Surgeon: Newt Minion, MD;  Location:  [facility];  Service: Orthopedics;  Laterality: Left;  . bilateral inguinal hernia    . I/D abcess on back  2007   1 WEEK   . right great toe surgery     Social History   Occupational History  . Not on file  Tobacco Use  . Smoking status: Never Smoker  . Smokeless tobacco: Never Used  Substance and Sexual Activity  . Alcohol use: No  . Drug use: No  . Sexual activity: Never

## 2018-11-04 ENCOUNTER — Other Ambulatory Visit: Payer: Self-pay | Admitting: Family Medicine

## 2018-12-10 ENCOUNTER — Other Ambulatory Visit: Payer: Self-pay

## 2018-12-10 ENCOUNTER — Ambulatory Visit: Payer: Medicare Other | Admitting: Family Medicine

## 2018-12-24 ENCOUNTER — Other Ambulatory Visit: Payer: Self-pay

## 2018-12-24 ENCOUNTER — Ambulatory Visit (INDEPENDENT_AMBULATORY_CARE_PROVIDER_SITE_OTHER): Payer: Medicare Other | Admitting: Family Medicine

## 2018-12-24 ENCOUNTER — Encounter: Payer: Self-pay | Admitting: Family Medicine

## 2018-12-24 VITALS — BP 120/78 | Ht 68.0 in | Wt 279.0 lb

## 2018-12-24 DIAGNOSIS — R7303 Prediabetes: Secondary | ICD-10-CM

## 2018-12-24 DIAGNOSIS — E785 Hyperlipidemia, unspecified: Secondary | ICD-10-CM | POA: Diagnosis not present

## 2018-12-24 DIAGNOSIS — I10 Essential (primary) hypertension: Secondary | ICD-10-CM | POA: Diagnosis not present

## 2018-12-24 NOTE — Assessment & Plan Note (Signed)
Hyperlipidemia:Low fat diet discussed and encouraged.   Lipid Panel  Lab Results  Component Value Date   CHOL 139 01/01/2018   HDL 53 01/01/2018   LDLCALC 72 01/01/2018   TRIG 64 01/01/2018   CHOLHDL 2.6 01/01/2018   Updated lab needed at/ before next visit.

## 2018-12-24 NOTE — Assessment & Plan Note (Signed)
  Patient re-educated about  the importance of commitment to a  minimum of 150 minutes of exercise per week as able.  The importance of healthy food choices with portion control discussed, as well as eating regularly and within a 12 hour window most days. The need to choose "clean , green" food 50 to 75% of the time is discussed, as well as to make water the primary drink and set a goal of 64 ounces water daily.    Weight /BMI 12/24/2018 10/22/2018 09/23/2018  WEIGHT 279 lb 279 lb 279 lb  HEIGHT 5\' 8"  5\' 8"  5\' 8"   BMI 42.42 kg/m2 42.42 kg/m2 42.42 kg/m2

## 2018-12-24 NOTE — Assessment & Plan Note (Signed)
Controlled, no change in medication DASH diet and commitment to daily physical activity for a minimum of 30 minutes discussed and encouraged, as a part of hypertension management. The importance of attaining a healthy weight is also discussed.  BP/Weight 12/24/2018 10/22/2018 09/23/2018 08/27/2018 08/22/2018 08/20/2018 4/82/5003  Systolic BP 704 - - - 888 - -  Diastolic BP 78 - - - 78 - -  Wt. (Lbs) 279 279 279 279 279 279 279.54  BMI 42.42 42.42 42.42 42.42 42.42 42.42 42.5

## 2018-12-24 NOTE — Assessment & Plan Note (Signed)
Patient educated about the importance of limiting  Carbohydrate intake , the need to commit to daily physical activity for a minimum of 30 minutes , and to commit weight loss. The fact that changes in all these areas will reduce or eliminate all together the development of diabetes is stressed.   Diabetic Labs Latest Ref Rng & Units 08/07/2018 07/04/2018 01/01/2018 12/13/2016 08/09/2016  HbA1c 4.8 - 5.6 % - 5.7(H) 5.9(H) 5.6 5.7(H)  Microalbumin Not Estab. ug/mL - - - - -  Micro/Creat Ratio 0.0 - 30.0 mg/g creat - - - - -  Chol <200 mg/dL - - 139 135 138  HDL >40 mg/dL - - 53 55 56  Calc LDL mg/dL (calc) - - 72 67 74  Triglycerides <150 mg/dL - - 64 51 38  Creatinine 0.61 - 1.24 mg/dL 1.00 0.95 1.26 1.10 1.10   BP/Weight 12/24/2018 10/22/2018 09/23/2018 08/27/2018 08/22/2018 08/20/2018 10/16/7515  Systolic BP 001 - - - 749 - -  Diastolic BP 78 - - - 78 - -  Wt. (Lbs) 279 279 279 279 279 279 279.54  BMI 42.42 42.42 42.42 42.42 42.42 42.42 42.5   Foot/eye exam completion dates 01/18/2016 09/30/2014  Foot Form Completion Done Done  Updated lab needed at/ before next visit.

## 2018-12-24 NOTE — Patient Instructions (Signed)
F/U in office with MD in 6 months, call if you need me sooner  Flu vaccine next Monday as arranged  Fasting lipid and hepatic next week Monday ( order sent to [facility])   Thankful that you are doing well  Continue to work on weight loss through healthy food choices and smaller portion sizes. Please also try to commit to regular exercise  every day inside of your home  Thanks for choosing [facility], we consider it a privelige to serve you. Marland Kitchen

## 2018-12-24 NOTE — Progress Notes (Signed)
Virtual Visit via Telephone Note  I connected with Jamie Frost on 12/24/18 at 10:00 AM EDT by telephone and verified that I am speaking with the correct person using two identifiers.  Location: Patient: home  Provider: office   I discussed the limitations, risks, security and privacy concerns of performing an evaluation and management service by telephone and the availability of in person appointments. I also discussed with the patient that there may be a patient responsible charge related to this service. The patient expressed understanding and agreed to proceed.   History of Present Illness: F/u chronic problems and med review Repoprts doing well with no concerns Denies recent fever or chills. Denies sinus pressure, nasal congestion, ear pain or sore throat. Denies chest congestion, productive cough or wheezing. Denies chest pains, palpitations and leg swelling Denies abdominal pain, nausea, vomiting,diarrhea or constipation.   Denies dysuria, frequency, hesitancy or incontinence. Denies joint pain,, has chronic  swelling and limitation in mobility. Denies headaches, seizures, numbness, or tingling. Denies depression, anxiety or insomnia. Denies skin break down or rash.       Observations/Objective: BP 120/78   Ht 5\' 8"  (1.727 m)   Wt 279 lb (126.6 kg)   BMI 42.42 kg/m  liimited  communication with no confusion and intact memory.Assisted by Aunt Alert and oriented x 3 No signs of respiratory distress during speech   Assessment and Plan:   Follow Up Instructions:    I discussed the assessment and treatment plan with the patient. The patient was provided an opportunity to ask questions and all were answered. The patient agreed with the plan and demonstrated an understanding of the instructions.   The patient was advised to call back or seek an in-person evaluation if the symptoms worsen or if the condition fails to improve as anticipated.  I provided 21  minutes of non-face-to-face time during this encounter.   Tula Nakayama, MD

## 2018-12-30 ENCOUNTER — Other Ambulatory Visit: Payer: Self-pay

## 2018-12-30 ENCOUNTER — Ambulatory Visit (INDEPENDENT_AMBULATORY_CARE_PROVIDER_SITE_OTHER): Payer: Medicare Other

## 2018-12-30 DIAGNOSIS — Z23 Encounter for immunization: Secondary | ICD-10-CM

## 2019-01-02 DIAGNOSIS — E785 Hyperlipidemia, unspecified: Secondary | ICD-10-CM | POA: Diagnosis not present

## 2019-01-02 LAB — HEPATIC FUNCTION PANEL
AG Ratio: 1.1 (calc) (ref 1.0–2.5)
ALT: 11 U/L (ref 9–46)
AST: 15 U/L (ref 10–40)
Albumin: 3.9 g/dL (ref 3.6–5.1)
Alkaline phosphatase (APISO): 84 U/L (ref 36–130)
Bilirubin, Direct: 0.2 mg/dL (ref 0.0–0.2)
Globulin: 3.5 g/dL (calc) (ref 1.9–3.7)
Indirect Bilirubin: 0.4 mg/dL (calc) (ref 0.2–1.2)
Total Bilirubin: 0.6 mg/dL (ref 0.2–1.2)
Total Protein: 7.4 g/dL (ref 6.1–8.1)

## 2019-01-02 LAB — LIPID PANEL
Cholesterol: 131 mg/dL (ref <200)
HDL: 53 mg/dL (ref >=40)
LDL Cholesterol (Calc): 64 mg/dL (calc)
Non-HDL Cholesterol (Calc): 78 mg/dL (calc) (ref <130)
Total CHOL/HDL Ratio: 2.5 (calc) (ref <5.0)
Triglycerides: 49 mg/dL (ref <150)

## 2019-01-13 ENCOUNTER — Other Ambulatory Visit: Payer: Self-pay | Admitting: Family Medicine

## 2019-02-11 ENCOUNTER — Other Ambulatory Visit: Payer: Self-pay | Admitting: Family Medicine

## 2019-03-31 ENCOUNTER — Other Ambulatory Visit: Payer: Self-pay | Admitting: Family Medicine

## 2019-05-01 ENCOUNTER — Other Ambulatory Visit: Payer: Self-pay | Admitting: Family Medicine

## 2019-05-29 ENCOUNTER — Ambulatory Visit: Payer: Medicare Other | Attending: Internal Medicine

## 2019-05-29 DIAGNOSIS — Z23 Encounter for immunization: Secondary | ICD-10-CM

## 2019-05-29 NOTE — Progress Notes (Signed)
   Covid-19 Vaccination Clinic  Name:  ARAVIND CHRISMER    MRN: [id number] DOB: November 20, 1975  05/29/2019  Mr. Judice was observed post Covid-19 immunization for 15 minutes without incident. He was provided with Vaccine Information Sheet and instruction to access the V-Safe system.   Mr. Antonacci was instructed to call 911 with any severe reactions post vaccine: Marland Kitchen Difficulty breathing  . Swelling of face and throat  . A fast heartbeat  . A bad rash all over body  . Dizziness and weakness   Immunizations Administered    Name Date Dose VIS Date Route   Moderna COVID-19 Vaccine 05/29/2019 11:28 AM 0.5 mL 02/04/2019 Intramuscular   Manufacturer: Moderna   Lot: [id number]   NDC: 40973-532-99

## 2019-06-24 ENCOUNTER — Ambulatory Visit: Payer: Medicare Other | Admitting: Family Medicine

## 2019-06-26 ENCOUNTER — Ambulatory Visit: Payer: Medicare Other | Attending: Internal Medicine

## 2019-06-26 DIAGNOSIS — Z23 Encounter for immunization: Secondary | ICD-10-CM

## 2019-06-26 NOTE — Progress Notes (Signed)
   Covid-19 Vaccination Clinic  Name:  Jamie Frost    MRN: [id number] DOB: 1975-11-07  06/26/2019  Mr. [name] was observed post Covid-19 immunization for 15 minutes without incident. He was provided with Vaccine Information Sheet and instruction to access the V-Safe system.   Mr. [name] was instructed to call 911 with any severe reactions post vaccine: Marland Kitchen Difficulty breathing  . Swelling of face and throat  . A fast heartbeat  . A bad rash all over body  . Dizziness and weakness   Immunizations Administered    Name Date Dose VIS Date Route   Moderna COVID-19 Vaccine 06/26/2019 11:04 AM 0.5 mL 02/2019 Intramuscular   Manufacturer: Moderna   Lot: [id number]   NDC: 03128-118-86

## 2019-09-17 ENCOUNTER — Encounter: Payer: Self-pay | Admitting: Family Medicine

## 2019-09-17 ENCOUNTER — Other Ambulatory Visit: Payer: Self-pay

## 2019-09-17 ENCOUNTER — Ambulatory Visit (INDEPENDENT_AMBULATORY_CARE_PROVIDER_SITE_OTHER): Payer: Medicare Other | Admitting: Family Medicine

## 2019-09-17 VITALS — BP 111/69 | HR 83 | Resp 16 | Ht 68.0 in | Wt 276.0 lb

## 2019-09-17 DIAGNOSIS — R7301 Impaired fasting glucose: Secondary | ICD-10-CM | POA: Diagnosis not present

## 2019-09-17 DIAGNOSIS — E559 Vitamin D deficiency, unspecified: Secondary | ICD-10-CM

## 2019-09-17 DIAGNOSIS — E785 Hyperlipidemia, unspecified: Secondary | ICD-10-CM

## 2019-09-17 DIAGNOSIS — F411 Generalized anxiety disorder: Secondary | ICD-10-CM

## 2019-09-17 DIAGNOSIS — R7303 Prediabetes: Secondary | ICD-10-CM | POA: Diagnosis not present

## 2019-09-17 DIAGNOSIS — I10 Essential (primary) hypertension: Secondary | ICD-10-CM | POA: Diagnosis not present

## 2019-09-17 NOTE — Patient Instructions (Addendum)
Wellness please schedule in September wth flu vaccine  Labs today ( [facility]) CBC, lipid, cmp and EGFr, TSH, Vit D, HBA1C will need to tell Aunt where the lab is OR [facility]  Thankful that your feet are better, keep wearing your BRACES and use cane, be careful not to fall!  Think about what you will eat, plan ahead. Choose " clean, green, fresh or frozen" over canned, processed or packaged foods which are more sugary, salty and fatty. 70 to 75% of food eaten should be vegetables and fruit. Three meals at set times with snacks allowed between meals, but they must be fruit or vegetables. Aim to eat over a 12 hour period , example 7 am to 7 pm, and STOP after  your last meal of the day. Drink water,generally about 64 ounces per day, no other drink is as healthy. Fruit juice is best enjoyed in a healthy way, by EATING the fruit.  It is important that you exercise regularly at least 30 minutes 5 times a week. If you develop chest pain, have severe difficulty breathing, or feel very tired, stop exercising immediately and seek medical attention   Exercises To Do While Sitting  Exercises that you do while sitting (chair exercises) can give you many of the same benefits as full exercise. Benefits include strengthening your heart, burning calories, and keeping muscles and joints healthy. Exercise can also improve your mood and help with depression and anxiety. You may benefit from chair exercises if you are unable to do standing exercises because of:  Diabetic foot pain.  Obesity.  Illness.  Arthritis.  Recovery from surgery or injury.  Breathing problems.  Balance problems.  Another type of disability. Before starting chair exercises, check with your health care provider or a physical therapist to find out how much exercise you can tolerate and which exercises are safe for you. If your health care provider approves:  Start out slowly and build up over time. Aim to work up to about 10-20  minutes for each exercise session.  Make exercise part of your daily routine.  Drink water when you exercise. Do not wait until you are thirsty. Drink every 10-15 minutes.  Stop exercising right away if you have pain, nausea, shortness of breath, or dizziness.  If you are exercising in a wheelchair, make sure to lock the wheels.  Ask your health care provider whether you can do tai chi or yoga. Many positions in these mind-body exercises can be modified to do while seated. Warm-up Before starting other exercises: 1. Sit up as straight as you can. Have your knees bent at 90 degrees, which is the shape of the capital letter "L." Keep your feet flat on the floor. 2. Sit at the front edge of your chair, if you can. 3. Pull in (tighten) the muscles in your abdomen and stretch your spine and neck as straight as you can. Hold this position for a few minutes. 4. Breathe in and out evenly. Try to concentrate on your breathing, and relax your mind. Stretching Exercise A: Arm stretch 1. Hold your arms out straight in front of your body. 2. Bend your hands at the wrist with your fingers pointing up, as if signaling someone to stop. Notice the slight tension in your forearms as you hold the position. 3. Keeping your arms out and your hands bent, rotate your hands outward as far as you can and hold this stretch. Aim to have your thumbs pointing up and your pinkie fingers  pointing down. Slowly repeat arm stretches for one minute as tolerated. Exercise B: Leg stretch 1. If you can move your legs, try to "draw" letters on the floor with the toes of your foot. Write your name with one foot. 2. Write your name with the toes of your other foot. Slowly repeat the movements for one minute as tolerated. Exercise C: Reach for the sky 1. Reach your hands as far over your head as you can to stretch your spine. 2. Move your hands and arms as if you are climbing a rope. Slowly repeat the movements for one minute  as tolerated. Range of motion exercises Exercise A: Shoulder roll 1. Let your arms hang loosely at your sides. 2. Lift just your shoulders up toward your ears, then let them relax back down. 3. When your shoulders feel loose, rotate your shoulders in backward and forward circles. Do shoulder rolls slowly for one minute as tolerated. Exercise B: March in place 1. As if you are marching, pump your arms and lift your legs up and down. Lift your knees as high as you can. ? If you are unable to lift your knees, just pump your arms and move your ankles and feet up and down. March in place for one minute as tolerated. Exercise C: Seated jumping jacks 1. Let your arms hang down straight. 2. Keeping your arms straight, lift them up over your head. Aim to point your fingers to the ceiling. 3. While you lift your arms, straighten your legs and slide your heels along the floor to your sides, as wide as you can. 4. As you bring your arms back down to your sides, slide your legs back together. ? If you are unable to use your legs, just move your arms. Slowly repeat seated jumping jacks for one minute as tolerated. Strengthening exercises Exercise A: Shoulder squeeze 1. Hold your arms straight out from your body to your sides, with your elbows bent and your fists pointed at the ceiling. 2. Keeping your arms in the bent position, move them forward so your elbows and forearms meet in front of your face. 3. Open your arms back out as wide as you can with your elbows still bent, until you feel your shoulder blades squeezing together. Hold for 5 seconds. Slowly repeat the movements forward and backward for one minute as tolerated. Contact a health care provider if you:  Had to stop exercising due to any of the following: ? Pain. ? Nausea. ? Shortness of breath. ? Dizziness. ? Fatigue.  Have significant pain or soreness after exercising. Get help right away if you have:  Chest pain.  Difficulty  breathing. These symptoms may represent a serious problem that is an emergency. Do not wait to see if the symptoms will go away. Get medical help right away. Call your local emergency services (911 in the [place] Do not drive yourself to the hospital. This information is not intended to replace advice given to you by your health care provider. Make sure you discuss any questions you have with your health care provider. Document Revised: 06/13/2018 Document Reviewed: 01/03/2017 Elsevier Patient Education  2020 [employer].

## 2019-09-18 ENCOUNTER — Other Ambulatory Visit: Payer: Self-pay | Admitting: Family Medicine

## 2019-09-18 LAB — CMP14+EGFR
ALT: 13 IU/L (ref 0–44)
AST: 20 IU/L (ref 0–40)
Albumin/Globulin Ratio: 1.3 (ref 1.2–2.2)
Albumin: 4.4 g/dL (ref 4.0–5.0)
Alkaline Phosphatase: 110 IU/L (ref 48–121)
BUN/Creatinine Ratio: 14 (ref 9–20)
BUN: 14 mg/dL (ref 6–24)
Bilirubin Total: 0.4 mg/dL (ref 0.0–1.2)
CO2: 25 mmol/L (ref 20–29)
Calcium: 9.8 mg/dL (ref 8.7–10.2)
Chloride: 99 mmol/L (ref 96–106)
Creatinine, Ser: 1 mg/dL (ref 0.76–1.27)
GFR calc Af Amer: 105 mL/min/{1.73_m2} (ref >59)
GFR calc non Af Amer: 91 mL/min/{1.73_m2} (ref >59)
Globulin, Total: 3.5 g/dL (ref 1.5–4.5)
Glucose: 81 mg/dL (ref 65–99)
Potassium: 5.3 mmol/L — ABNORMAL HIGH (ref 3.5–5.2)
Sodium: 138 mmol/L (ref 134–144)
Total Protein: 7.9 g/dL (ref 6.0–8.5)

## 2019-09-18 LAB — HEMOGLOBIN A1C
Est. average glucose Bld gHb Est-mCnc: 117 mg/dL
Hgb A1c MFr Bld: 5.7 % — ABNORMAL HIGH (ref 4.8–5.6)

## 2019-09-18 LAB — LIPID PANEL
Chol/HDL Ratio: 2.8 ratio (ref 0.0–5.0)
Cholesterol, Total: 172 mg/dL (ref 100–199)
HDL: 61 mg/dL (ref >39)
LDL Chol Calc (NIH): 98 mg/dL (ref 0–99)
Triglycerides: 65 mg/dL (ref 0–149)
VLDL Cholesterol Cal: 13 mg/dL (ref 5–40)

## 2019-09-18 LAB — CBC
Hematocrit: 42.6 % (ref 37.5–51.0)
Hemoglobin: 13.2 g/dL (ref 13.0–17.7)
MCH: 26.7 pg (ref 26.6–33.0)
MCHC: 31 g/dL — ABNORMAL LOW (ref 31.5–35.7)
MCV: 86 fL (ref 79–97)
Platelets: 264 10*3/uL (ref 150–450)
RBC: 4.94 x10E6/uL (ref 4.14–5.80)
RDW: 13.6 % (ref 11.6–15.4)
WBC: 9.6 10*3/uL (ref 3.4–10.8)

## 2019-09-18 LAB — TSH: TSH: 1.6 u[IU]/mL (ref 0.450–4.500)

## 2019-09-18 LAB — VITAMIN D 25 HYDROXY (VIT D DEFICIENCY, FRACTURES): Vit D, 25-Hydroxy: 39.6 ng/mL (ref 30.0–100.0)

## 2019-09-19 ENCOUNTER — Encounter: Payer: Self-pay | Admitting: Family Medicine

## 2019-09-19 NOTE — Assessment & Plan Note (Signed)
Patient educated about the importance of limiting  Carbohydrate intake , the need to commit to daily physical activity for a minimum of 30 minutes , and to commit weight loss. The fact that changes in all these areas will reduce or eliminate all together the development of diabetes is stressed.  Unchnaged and nearly normal, which is great  Diabetic Labs Latest Ref Rng & Units 09/17/2019 01/02/2019 08/07/2018 07/04/2018 01/01/2018  HbA1c 4.8 - 5.6 % 5.7(H) - - 5.7(H) 5.9(H)  Microalbumin Not Estab. ug/mL - - - - -  Micro/Creat Ratio 0.0 - 30.0 mg/g creat - - - - -  Chol 100 - 199 mg/dL 536 468 - - 032  HDL >12 mg/dL 61 53 - - 53  Calc LDL 0 - 99 mg/dL 98 64 - - 72  Triglycerides 0 - 149 mg/dL 65 49 - - 64  Creatinine 0.76 - 1.27 mg/dL 2.48 - 2.50 0.37 0.48   BP/Weight 09/17/2019 12/24/2018 10/22/2018 09/23/2018 08/27/2018 08/22/2018 08/20/2018  Systolic BP 111 120 - - - 118 -  Diastolic BP 69 78 - - - 78 -  Wt. (Lbs) 276 279 279 279 279 279 279  BMI 41.97 42.42 42.42 42.42 42.42 42.42 42.42   Foot/eye exam completion dates 01/18/2016 09/30/2014  Foot Form Completion Done Done

## 2019-09-19 NOTE — Assessment & Plan Note (Signed)
Controlled, no change in medication DASH diet and commitment to daily physical activity for a minimum of 30 minutes discussed and encouraged, as a part of hypertension management. The importance of attaining a healthy weight is also discussed.  BP/Weight 09/17/2019 12/24/2018 10/22/2018 09/23/2018 08/27/2018 08/22/2018 08/20/2018  Systolic BP 111 120 - - - 118 -  Diastolic BP 69 78 - - - 78 -  Wt. (Lbs) 276 279 279 279 279 279 279  BMI 41.97 42.42 42.42 42.42 42.42 42.42 42.42

## 2019-09-19 NOTE — Assessment & Plan Note (Signed)
  Patient re-educated about  the importance of commitment to a  minimum of 150 minutes of exercise per week as able.  The importance of healthy food choices with portion control discussed, as well as eating regularly and within a 12 hour window most days. The need to choose "clean , green" food 50 to 75% of the time is discussed, as well as to make water the primary drink and set a goal of 64 ounces water daily.    Weight /BMI 09/17/2019 12/24/2018 10/22/2018  WEIGHT 276 lb 279 lb 279 lb  HEIGHT 5\' 8"  5\' 8"  5\' 8"   BMI 41.97 kg/m2 42.42 kg/m2 42.42 kg/m2

## 2019-09-19 NOTE — Assessment & Plan Note (Signed)
Controlled, no change in medication  

## 2019-09-19 NOTE — Assessment & Plan Note (Signed)
Hyperlipidemia:Low fat diet discussed and encouraged.   Lipid Panel  Lab Results  Component Value Date   CHOL 172 09/17/2019   HDL 61 09/17/2019   LDLCALC 98 09/17/2019   TRIG 65 09/17/2019   CHOLHDL 2.8 09/17/2019   Controlled, no change in medication

## 2019-09-19 NOTE — Progress Notes (Signed)
Jamie Frost     MRN: [id number]      DOB: November 19, 1975   HPI Jamie Frost is here for follow up and re-evaluation of chronic medical conditions, medication management and review of any available recent lab and radiology data.  Preventive health is updated, specifically  Cancer screening and Immunization.   Questions or concerns regarding consultations or procedures which the PT has had in the interim are  addressed. The PT denies any adverse reactions to current medications since the last visit.  There are no new concerns.  There are no specific complaints   ROS Denies recent fever or chills. Denies sinus pressure, nasal congestion, ear pain or sore throat. Denies chest congestion, productive cough or wheezing. Denies chest pains, palpitations and leg swelling Denies abdominal pain, nausea, vomiting,diarrhea or constipation.   Denies dysuria, frequency, hesitancy or incontinence. Denies uncontrolled  joint pain, swelling and limitation in mobility. Denies headaches, seizures, numbness, or tingling. Denies depression, anxiety or insomnia. Denies skin break down or rash.   PE  BP 111/69   Pulse 83   Resp 16   Ht 5\' 8"  (1.727 m)   Wt 276 lb (125.2 kg)   SpO2 98%   BMI 41.97 kg/m   Patient alert and oriented and in no cardiopulmonary distress.  HEENT: No facial asymmetry, EOMI,     Neck supple .  Chest: Clear to auscultation bilaterally.  CVS: S1, S2 no murmurs, no S3.Regular rate.  ABD: Soft non tender.   Ext: No edema  MS: decreased  ROM spine, shoulders, hips, ankles  and knees.  Skin: Intact, no ulcerations or rash noted.  Psych: Good eye contact, t. Memory intact not anxious or depressed appearing.  CNS: CN 2-12 intact, power,  normal throughout.no focal deficits noted.   Assessment & Plan  Essential hypertension Controlled, no change in medication DASH diet and commitment to daily physical activity for a minimum of 30 minutes discussed and  encouraged, as a part of hypertension management. The importance of attaining a healthy weight is also discussed.  BP/Weight 09/17/2019 12/24/2018 10/22/2018 09/23/2018 08/27/2018 08/22/2018 08/20/2018  Systolic BP 111 120 - - - 118 -  Diastolic BP 69 78 - - - 78 -  Wt. (Lbs) 276 279 279 279 279 279 279  BMI 41.97 42.42 42.42 42.42 42.42 42.42 42.42       GENERALIZED ANXIETY DISORDER Controlled, no change in medication   Morbid obesity (HCC)  Patient re-educated about  the importance of commitment to a  minimum of 150 minutes of exercise per week as able.  The importance of healthy food choices with portion control discussed, as well as eating regularly and within a 12 hour window most days. The need to choose "clean , green" food 50 to 75% of the time is discussed, as well as to make water the primary drink and set a goal of 64 ounces water daily.    Weight /BMI 09/17/2019 12/24/2018 10/22/2018  WEIGHT 276 lb 279 lb 279 lb  HEIGHT 5\' 8"  5\' 8"  5\' 8"   BMI 41.97 kg/m2 42.42 kg/m2 42.42 kg/m2      Hyperlipidemia LDL goal <100 Hyperlipidemia:Low fat diet discussed and encouraged.   Lipid Panel  Lab Results  Component Value Date   CHOL 172 09/17/2019   HDL 61 09/17/2019   LDLCALC 98 09/17/2019   TRIG 65 09/17/2019   CHOLHDL 2.8 09/17/2019   Controlled, no change in medication     Prediabetes Patient educated about the importance  of limiting  Carbohydrate intake , the need to commit to daily physical activity for a minimum of 30 minutes , and to commit weight loss. The fact that changes in all these areas will reduce or eliminate all together the development of diabetes is stressed.  Unchnaged and nearly normal, which is great  Diabetic Labs Latest Ref Rng & Units 09/17/2019 01/02/2019 08/07/2018 07/04/2018 01/01/2018  HbA1c 4.8 - 5.6 % 5.7(H) - - 5.7(H) 5.9(H)  Microalbumin Not Estab. ug/mL - - - - -  Micro/Creat Ratio 0.0 - 30.0 mg/g creat - - - - -  Chol 100 - 199 mg/dL  893 810 - - 175  HDL >10 mg/dL 61 53 - - 53  Calc LDL 0 - 99 mg/dL 98 64 - - 72  Triglycerides 0 - 149 mg/dL 65 49 - - 64  Creatinine 0.76 - 1.27 mg/dL 2.58 - 5.27 7.82 4.23   BP/Weight 09/17/2019 12/24/2018 10/22/2018 09/23/2018 08/27/2018 08/22/2018 08/20/2018  Systolic BP 111 120 - - - 118 -  Diastolic BP 69 78 - - - 78 -  Wt. (Lbs) 276 279 279 279 279 279 279  BMI 41.97 42.42 42.42 42.42 42.42 42.42 42.42   Foot/eye exam completion dates 01/18/2016 09/30/2014  Foot Form Completion Done Done

## 2019-12-09 ENCOUNTER — Other Ambulatory Visit: Payer: Self-pay | Admitting: Family Medicine

## 2019-12-12 ENCOUNTER — Other Ambulatory Visit: Payer: Self-pay

## 2019-12-12 MED ORDER — SIMVASTATIN 40 MG PO TABS: 40.0000 mg | ORAL_TABLET | Freq: Every day | ORAL | 0 refills | Status: DC

## 2019-12-12 MED ORDER — BENAZEPRIL HCL 10 MG PO TABS: ORAL_TABLET | ORAL | 0 refills | Status: DC

## 2019-12-15 ENCOUNTER — Ambulatory Visit (INDEPENDENT_AMBULATORY_CARE_PROVIDER_SITE_OTHER): Payer: Medicare Other

## 2019-12-15 ENCOUNTER — Other Ambulatory Visit: Payer: Self-pay

## 2019-12-15 VITALS — BP 111/69 | Ht 68.0 in | Wt 276.0 lb

## 2019-12-15 DIAGNOSIS — Z0000 Encounter for general adult medical examination without abnormal findings: Secondary | ICD-10-CM | POA: Diagnosis not present

## 2019-12-15 NOTE — Progress Notes (Signed)
Subjective:   Jamie Frost is a 44 y.o. male who presents for Medicare Annual/Subsequent preventive examination.  Review of Systems    Cardiac Risk Factors include: hypertension;obesity (BMI >30kg/m2)     Objective:    Today's Vitals   12/15/19 0953  BP: 111/69  Weight: 276 lb (125.2 kg)  Height: 5\' 8"  (1.727 m)   Body mass index is 41.97 kg/m.  Advanced Directives 12/15/2019 08/07/2018 08/20/2017  Does Patient Have a Medical Advance Directive? No No Yes  Type of Advance Directive - - (No Data)  Does patient want to make changes to medical advance directive? - - No - Patient declined  Would patient like information on creating a medical advance directive? No - Patient declined No - Patient declined -    Current Medications (verified) Outpatient Encounter Medications as of 12/15/2019  Medication Sig  . albuterol (PROVENTIL) (2.5 MG/3ML) 0.083% nebulizer solution Take 3 mLs (2.5 mg total) by nebulization every 6 (six) hours as needed. One vial per nebulizer every 6 to 8 hours prn  . benazepril (LOTENSIN) 10 MG tablet TAKE ONE (1) TABLET EACH DAY  . busPIRone (BUSPAR) 15 MG tablet TAKE 1/2 TABLET TWICE DAILY  . Elite Nebulizer System MISC by Does not apply route.    . fluticasone (FLONASE) 50 MCG/ACT nasal spray USE 2 SPRAYS IN EACH NOSTRIL DAILY (Patient taking differently: Place 2 sprays into both nostrils daily. )  . loratadine (CLARITIN) 10 MG tablet TAKE ONE (1) TABLET EACH DAY  . mupirocin ointment (BACTROBAN) 2 % Place 1 application into the nose daily as needed (irritation).  . potassium chloride SA (KLOR-CON) 20 MEQ tablet TAKE ONE (1) TABLET EACH DAY  . simvastatin (ZOCOR) 40 MG tablet Take 1 tablet (40 mg total) by mouth at bedtime.  . Vitamin D, Ergocalciferol, (DRISDOL) 1.25 MG (50000 UNIT) CAPS capsule TAKE 1 CAPSULE EVERY WEEK   No facility-administered encounter medications on file as of 12/15/2019.    Allergies (verified) Sulfonamide derivatives    History: Past Medical History:  Diagnosis Date  . Allergic rhinitis, seasonal   . Arthritis   . Diabetes mellitus without complication (HCC)   . Hyperlipidemia   . Hypertension   . Impaired fasting glucose 12/09/2009  . Obesity   . Pulmonary hypoplasia    Past Surgical History:  Procedure Laterality Date  . ANKLE FUSION Left 08/09/2018   Procedure: LEFT TIBIO-CALCANEAL FUSION;  Surgeon: Nadara Mustarduda, Marcus V, MD;  [facility];  Service: Orthopedics;  Laterality: Left;  . bilateral inguinal hernia    . I/D abcess on back  2007   1 WEEK   . right great toe surgery     Family History  Problem Relation Age of Onset  . Obesity Mother   . Hypertension Mother   . Arthritis Mother    Social History   Socioeconomic History  . Marital status: Single    Spouse name: Not on file  . Number of children: Not on file  . Years of education: Not on file  . Highest education level: Not on file  Occupational History  . Not on file  Tobacco Use  . Smoking status: Never Smoker  . Smokeless tobacco: Never Used  Vaping Use  . Vaping Use: Never used  Substance and Sexual Activity  . Alcohol use: No  . Drug use: No  . Sexual activity: Never  Other Topics Concern  . Not on file  Social History Narrative   He enjoys reading the  bible during his free time.    Social Determinants of Health   Financial Resource Strain: Low Risk   . Difficulty of Paying Living Expenses: Not hard at all  Food Insecurity: No Food Insecurity  . Worried About Programme researcher, broadcasting/film/video in the Last Year: Never true  . Ran Out of Food in the Last Year: Never true  Transportation Needs: No Transportation Needs  . Lack of Transportation (Medical): No  . Lack of Transportation (Non-Medical): No  Physical Activity: Insufficiently Active  . Days of Exercise per Week: 6 days  . Minutes of Exercise per Session: 10 min  Stress: No Stress Concern Present  . Feeling of Stress : Only a little  Social Connections: Moderately  Integrated  . Frequency of Communication with Friends and Family: More than three times a week  . Frequency of Social Gatherings with Friends and Family: Once a week  . Attends Religious Services: More than 4 times per year  . Active Member of Clubs or Organizations: No  . Attends Banker Meetings: Never  . Marital Status: Living with partner    Tobacco Counseling Counseling given: Not Answered   Clinical Intake:  Pre-visit preparation completed: Yes  Pain : No/denies pain     BMI - recorded: 41.98 Nutritional Status: BMI > 30  Obese Nutritional Risks: None Diabetes: No  How often do you need to have someone help you when you read instructions, pamphlets, or other written materials from your doctor or pharmacy?: 5 - Always What is the last grade level you completed in school?: 12  Diabetic?no  Interpreter Needed?: No      Activities of Daily Living In your present state of health, do you have any difficulty performing the following activities: 12/15/2019  Hearing? N  Vision? N  Difficulty concentrating or making decisions? Y  Walking or climbing stairs? Y  Dressing or bathing? Y  Doing errands, shopping? Y  Preparing Food and eating ? Y  Using the Toilet? N  In the past six months, have you accidently leaked urine? N  Do you have problems with loss of bowel control? N  Managing your Medications? Y  Managing your Finances? Y  Housekeeping or managing your Housekeeping? Y  Some recent data might be hidden    Patient Care Team: Kerri Perches, MD as PCP - General  Indicate any recent Medical Services you may have received from other than [facility] providers in the past year (date may be approximate).     Assessment:   This is a routine wellness examination for Jamie Frost.  Hearing/Vision screen No exam data present  Dietary issues and exercise activities discussed: Current Exercise Habits: Home exercise routine, Type of exercise: walking, Time  (Minutes): 10, Frequency (Times/Week): 6, Weekly Exercise (Minutes/Week): 60, Intensity: Mild, Exercise limited by: None identified  Goals    . Weight (lb) < 200 lb (90.7 kg)     Wants to lose 20-30lbs.      Depression Screen PHQ 2/9 Scores 12/15/2019 12/15/2019 09/17/2019 08/22/2018 07/03/2018 01/01/2018 08/20/2017  PHQ - 2 Score 0 0 0 0 0 0 0  PHQ- 9 Score - - - - - - -    Fall Risk Fall Risk  12/15/2019 09/17/2019 12/24/2018 08/22/2018 07/10/2018  Falls in the past year? 1 0 1 1 1   Number falls in past yr: 1 0 1 1 1   Injury with Fall? 0 0 0 1 0  Risk for fall due to : History of fall(s);Impaired  balance/gait - - - -  Follow up Falls evaluation completed - - - -    Any stairs in or around the home? Yes  If so, are there any without handrails? No  Home free of loose throw rugs in walkways, pet beds, electrical cords, etc? Yes  Adequate lighting in your home to reduce risk of falls? Yes   ASSISTIVE DEVICES UTILIZED TO PREVENT FALLS:  Life alert? No  Use of a cane, walker or w/c? Yes  Grab bars in the bathroom? Yes  Shower chair or bench in shower? Yes  Elevated toilet seat or a handicapped toilet? Yes   TIMED UP AND GO:  Was the test performed? No .  Length of time to ambulate n/a    Cognitive Function: MMSE - Mini Mental State Exam 08/20/2017  Orientation to time 5  Orientation to Place 5  Registration 3  Attention/ Calculation 5  Recall 2  Language- name 2 objects 2  Language- repeat 1  Language- follow 3 step command 3  Language- read & follow direction 1  Write a sentence 1  Copy design 0  Total score 28     6CIT Screen 12/15/2019 08/22/2018 08/20/2017  What Year? 0 points 0 points 0 points  What month? 0 points 0 points 0 points  What time? 0 points 0 points 0 points  Count back from 20 4 points 2 points 4 points  Months in reverse 4 points 4 points 4 points  Repeat phrase 0 points 2 points 4 points  Total Score 8 8 12     Immunizations Immunization  History  Administered Date(s) Administered  . Influenza Split 12/30/2010, 11/29/2011  . Influenza Whole 01/19/2007, 12/24/2008, 11/19/2009  . Influenza,inj,Quad PF,6+ Mos 12/16/2012, 12/17/2013, 12/21/2014, 12/20/2015, 12/12/2016, 01/01/2018, 12/30/2018  . Moderna SARS-COVID-2 Vaccination 05/29/2019, 06/26/2019  . Pneumococcal Polysaccharide-23 07/01/2010, 09/15/2015  . Td 12/10/2002  . Tdap 12/30/2010    TDAP status: Up to date Flu Vaccine status: Declined, Education has been provided regarding the importance of this vaccine but patient still declined. Advised may receive this vaccine at local pharmacy or Health Dept. Aware to provide a copy of the vaccination record if obtained from local pharmacy or Health Dept. Verbalized acceptance and understanding. Pneumococcal vaccine status: Up to date Covid-19 vaccine status: Completed vaccines  Qualifies for Shingles Vaccine? no  Zostavax completed no     Screening Tests Health Maintenance  Topic Date Due  . INFLUENZA VACCINE  10/05/2019  . TETANUS/TDAP  12/29/2020  . COVID-19 Vaccine  Completed  . Hepatitis C Screening  Completed  . HIV Screening  Completed    Health Maintenance  Health Maintenance Due  Topic Date Due  . INFLUENZA VACCINE  10/05/2019     Lung Cancer Screening: (Low Dose CT Chest recommended if Age 46-80 years, 30 pack-year currently smoking OR have quit w/in 15years.) does not qualify.   Lung Cancer Screening Referral: n/a  Additional Screening:  Hepatitis C Screening: does not qualify; Completed   Vision Screening: Recommended annual ophthalmology exams for early detection of glaucoma and other disorders of the eye. Is the patient up to date with their annual eye exam?  No  Who is the provider or what is the name of the office in which the patient attends annual eye exams? n/a If pt is not established with a provider, would they like to be referred to a provider to establish care? No .   Dental  Screening: Recommended annual dental exams for proper oral hygiene  Community Resource Referral / Chronic Care Management: CRR required this visit?  No   CCM required this visit?  No      Plan:     I have personally reviewed and noted the following in the patient's chart:   . Medical and social history . Use of alcohol, tobacco or illicit drugs  . Current medications and supplements . Functional ability and status . Nutritional status . Physical activity . Advanced directives . List of other physicians . Hospitalizations, surgeries, and ER visits in previous 12 months . Vitals . Screenings to include cognitive, depression, and falls . Referrals and appointments  In addition, I have reviewed and discussed with patient certain preventive protocols, quality metrics, and best practice recommendations. A written personalized care plan for preventive services as well as general preventive health recommendations were provided to patient.     Jerilynn Mages, [place]   [id number]/10/6576   Nurse Notes:

## 2020-01-05 ENCOUNTER — Other Ambulatory Visit: Payer: Self-pay

## 2020-01-05 MED ORDER — BENAZEPRIL HCL 10 MG PO TABS: ORAL_TABLET | ORAL | 1 refills | Status: DC

## 2020-01-05 MED ORDER — SIMVASTATIN 40 MG PO TABS: 40.0000 mg | ORAL_TABLET | Freq: Every day | ORAL | 1 refills | Status: DC

## 2020-03-31 ENCOUNTER — Telehealth: Payer: Self-pay

## 2020-03-31 NOTE — Telephone Encounter (Signed)
Patient needs a virtual visit scheduled with Dr Lodema Hong in the next few weeks. Thanks

## 2020-04-01 NOTE — Telephone Encounter (Signed)
Pt scheduled  

## 2020-04-05 ENCOUNTER — Other Ambulatory Visit: Payer: Self-pay

## 2020-04-05 ENCOUNTER — Telehealth (INDEPENDENT_AMBULATORY_CARE_PROVIDER_SITE_OTHER): Payer: Medicare Other | Admitting: Family Medicine

## 2020-04-05 ENCOUNTER — Encounter: Payer: Self-pay | Admitting: Family Medicine

## 2020-04-05 VITALS — Ht 68.0 in | Wt 266.1 lb

## 2020-04-05 DIAGNOSIS — M216X2 Other acquired deformities of left foot: Secondary | ICD-10-CM

## 2020-04-05 DIAGNOSIS — M216X1 Other acquired deformities of right foot: Secondary | ICD-10-CM | POA: Diagnosis not present

## 2020-04-05 DIAGNOSIS — E559 Vitamin D deficiency, unspecified: Secondary | ICD-10-CM

## 2020-04-05 DIAGNOSIS — R7303 Prediabetes: Secondary | ICD-10-CM | POA: Diagnosis not present

## 2020-04-05 DIAGNOSIS — Z6841 Body Mass Index (BMI) 40.0 and over, adult: Secondary | ICD-10-CM

## 2020-04-05 DIAGNOSIS — E785 Hyperlipidemia, unspecified: Secondary | ICD-10-CM

## 2020-04-05 DIAGNOSIS — R7301 Impaired fasting glucose: Secondary | ICD-10-CM | POA: Diagnosis not present

## 2020-04-05 DIAGNOSIS — F411 Generalized anxiety disorder: Secondary | ICD-10-CM | POA: Diagnosis not present

## 2020-04-05 DIAGNOSIS — I10 Essential (primary) hypertension: Secondary | ICD-10-CM | POA: Diagnosis not present

## 2020-04-05 DIAGNOSIS — M21542 Acquired clubfoot, left foot: Secondary | ICD-10-CM | POA: Diagnosis not present

## 2020-04-05 NOTE — Assessment & Plan Note (Signed)
S/p surgery, increase in left limp and unsteady  Gait over past several months, refer to surgeon for re evaluation

## 2020-04-05 NOTE — Assessment & Plan Note (Signed)
Hyperlipidemia:Low fat diet discussed and encouraged.   Lipid Panel  Lab Results  Component Value Date   CHOL 172 09/17/2019   HDL 61 09/17/2019   LDLCALC 98 09/17/2019   TRIG 65 09/17/2019   CHOLHDL 2.8 09/17/2019  Controlled, no change in medication Updated lab needed at/ before next visit.

## 2020-04-05 NOTE — Assessment & Plan Note (Signed)
Patient educated about the importance of limiting  Carbohydrate intake , the need to commit to daily physical activity for a minimum of 30 minutes , and to commit weight loss. The fact that changes in all these areas will reduce or eliminate all together the development of diabetes is stressed.   Diabetic Labs Latest Ref Rng & Units 09/17/2019 01/02/2019 08/07/2018 07/04/2018 01/01/2018  HbA1c 4.8 - 5.6 % 5.7(H) - - 5.7(H) 5.9(H)  Microalbumin Not Estab. ug/mL - - - - -  Micro/Creat Ratio 0.0 - 30.0 mg/g creat - - - - -  Chol 100 - 199 mg/dL 099 833 - - 825  HDL >05 mg/dL 61 53 - - 53  Calc LDL 0 - 99 mg/dL 98 64 - - 72  Triglycerides 0 - 149 mg/dL 65 49 - - 64  Creatinine 0.76 - 1.27 mg/dL 3.97 - 6.73 4.19 3.79   BP/Weight 04/05/2020 12/15/2019 09/17/2019 12/24/2018 10/22/2018 09/23/2018 08/27/2018  Systolic BP - 111 111 120 - - -  Diastolic BP - 69 69 78 - - -  Wt. (Lbs) 266.1 276 276 279 279 279 279  BMI 40.46 41.97 41.97 42.42 42.42 42.42 42.42   Foot/eye exam completion dates 01/18/2016 09/30/2014  Foot Form Completion Done Done   Updated lab needed at/ before next visit.

## 2020-04-05 NOTE — Assessment & Plan Note (Signed)
Controlled, no change in medication  

## 2020-04-05 NOTE — Patient Instructions (Addendum)
F/u in 5 months, call if you need me before  Be careful no more falls  Please come in this week for flu vaccine,  please call with appt  Fasting lipid, cmp and egfr hba1c and vit d this week  You are referred to Dr Duda re your left  foot  Think about what you will eat, plan ahead. Choose " clean, green, fresh or frozen" over canned, processed or packaged foods which are more sugary, salty and fatty. 70 to 75% of food eaten should be vegetables and fruit. Three meals at set times with snacks allowed between meals, but they must be fruit or vegetables. Aim to eat over a 12 hour period , example 7 am to 7 pm, and STOP after  your last meal of the day. Drink water,generally about 64 ounces per day, no other drink is as healthy. Fruit juice is best enjoyed in a healthy way, by EATING the fruit.    

## 2020-04-05 NOTE — Progress Notes (Signed)
Virtual Visit via Telephone Note  I connected with Jamie Frost on 04/05/20 at  3:00 PM EST by telephone and verified that I am speaking with the correct person using two identifiers.  Location: Patient: home Provider: work   I discussed the limitations, risks, security and privacy concerns of performing an evaluation and management service by telephone and the availability of in person appointments. I also discussed with the patient that there may be a patient responsible charge related to this service. The patient expressed understanding and agreed to proceed.   History of Present Illness: Jamie Frost, his Aunt who cares for him answered most questions asked, [name] is able and also answered questions F/U chronic problems and address any new or current concerns. Review and update medications and allergies. Review recent lab and radiologic data . Update routine health maintainace. Review an encourage improved health habits to include nutrition, exercise and  sleep . Fell on snow down ramp going to mailbox early in January and aunt reports increased gait instability and limping of the foot he had surgery on , wants follow up. This is noted prior to recent fall Seems to miss his Mom and wants to know when she will return home, the state has her placed in a facility per his unt Denies recent fever or chills. Denies sinus pressure, nasal congestion, ear pain or sore throat. Denies chest congestion, productive cough or wheezing. Denies chest pains, palpitations and leg swelling Denies abdominal pain, nausea, vomiting,diarrhea or constipation.   Denies dysuria, frequency, hesitancy or incontinence.  Denies headaches, seizures, numbness, or tingling. Denies uncontrolled  depression, anxiety or insomnia. Denies skin break down or rash.         Observations/Objective: Ht 5\' 8"  (1.727 m)   Wt 266 lb 1.6 oz (120.7 kg)   BMI 40.46 kg/m  Good communication with no confusion and  intact memory. Alert and oriented x 3 No signs of respiratory distress during speech    Assessment and Plan: Hyperlipidemia LDL goal <100 Hyperlipidemia:Low fat diet discussed and encouraged.   Lipid Panel  Lab Results  Component Value Date   CHOL 172 09/17/2019   HDL 61 09/17/2019   LDLCALC 98 09/17/2019   TRIG 65 09/17/2019   CHOLHDL 2.8 09/17/2019  Controlled, no change in medication Updated lab needed at/ before next visit.      Body mass index 40.0-44.9, adult [facility])  Patient re-educated about  the importance of commitment to a  minimum of 150 minutes of exercise per week as able.  The importance of healthy food choices with portion control discussed, as well as eating regularly and within a 12 hour window most days. The need to choose "clean , green" food 50 to 75% of the time is discussed, as well as to make water the primary drink and set a goal of 64 ounces water daily.    Weight /BMI 04/05/2020 12/15/2019 09/17/2019  WEIGHT 266 lb 1.6 oz 276 lb 276 lb  HEIGHT 5\' 8"  5\' 8"  5\' 8"   BMI 40.46 kg/m2 41.97 kg/m2 41.97 kg/m2      Cavovarus deformity of foot, acquired, left S/p surgery, increase in left limp and unsteady  Gait over past several months, refer to surgeon for re evaluation  Prediabetes Patient educated about the importance of limiting  Carbohydrate intake , the need to commit to daily physical activity for a minimum of 30 minutes , and to commit weight loss. The fact that changes in all these areas will reduce or  eliminate all together the development of diabetes is stressed.   Diabetic Labs Latest Ref Rng & Units 09/17/2019 01/02/2019 08/07/2018 07/04/2018 01/01/2018  HbA1c 4.8 - 5.6 % 5.7(H) - - 5.7(H) 5.9(H)  Microalbumin Not Estab. ug/mL - - - - -  Micro/Creat Ratio 0.0 - 30.0 mg/g creat - - - - -  Chol 100 - 199 mg/dL 161 096 - - 045  HDL >40 mg/dL 61 53 - - 53  Calc LDL 0 - 99 mg/dL 98 64 - - 72  Triglycerides 0 - 149 mg/dL 65 49 - - 64   Creatinine 0.76 - 1.27 mg/dL 9.81 - 1.91 4.78 2.95   BP/Weight 04/05/2020 12/15/2019 09/17/2019 12/24/2018 10/22/2018 09/23/2018 08/27/2018  Systolic BP - 111 111 120 - - -  Diastolic BP - 69 69 78 - - -  Wt. (Lbs) 266.1 276 276 279 279 279 279  BMI 40.46 41.97 41.97 42.42 42.42 42.42 42.42   Foot/eye exam completion dates 01/18/2016 09/30/2014  Foot Form Completion Done Done   Updated lab needed at/ before next visit.    Acquired equinovarus deformity of left foot Increased limp and unsteady gait noted in the past 6 months, refer ortho Dr Lajoyce Corners  GENERALIZED ANXIETY DISORDER Controlled, no change in medication   Essential hypertension DASH diet and commitment to daily physical activity for a minimum of 30 minutes discussed and encouraged, as a part of hypertension management. The importance of attaining a healthy weight is also discussed.  BP/Weight 04/05/2020 12/15/2019 09/17/2019 12/24/2018 10/22/2018 09/23/2018 08/27/2018  Systolic BP - 111 111 120 - - -  Diastolic BP - 69 69 78 - - -  Wt. (Lbs) 266.1 276 276 279 279 279 279  BMI 40.46 41.97 41.97 42.42 42.42 42.42 42.42          Follow Up Instructions:    I discussed the assessment and treatment plan with the patient. The patient was provided an opportunity to ask questions and all were answered. The patient agreed with the plan and demonstrated an understanding of the instructions.   The patient was advised to call back or seek an in-person evaluation if the symptoms worsen or if the condition fails to improve as anticipated.  I provided 22 minutes of non-face-to-face time during this encounter.   Syliva Overman, MD

## 2020-04-05 NOTE — Assessment & Plan Note (Signed)
DASH diet and commitment to daily physical activity for a minimum of 30 minutes discussed and encouraged, as a part of hypertension management. The importance of attaining a healthy weight is also discussed.  BP/Weight 04/05/2020 12/15/2019 09/17/2019 12/24/2018 10/22/2018 09/23/2018 08/27/2018  Systolic BP - 111 111 120 - - -  Diastolic BP - 69 69 78 - - -  Wt. (Lbs) 266.1 276 276 279 279 279 279  BMI 40.46 41.97 41.97 42.42 42.42 42.42 42.42

## 2020-04-05 NOTE — Assessment & Plan Note (Signed)
Increased limp and unsteady gait noted in the past 6 months, refer ortho Dr Lajoyce Corners

## 2020-04-05 NOTE — Assessment & Plan Note (Signed)
  Patient re-educated about  the importance of commitment to a  minimum of 150 minutes of exercise per week as able.  The importance of healthy food choices with portion control discussed, as well as eating regularly and within a 12 hour window most days. The need to choose "clean , green" food 50 to 75% of the time is discussed, as well as to make water the primary drink and set a goal of 64 ounces water daily.    Weight /BMI 04/05/2020 12/15/2019 09/17/2019  WEIGHT 266 lb 1.6 oz 276 lb 276 lb  HEIGHT 5\' 8"  5\' 8"  5\' 8"   BMI 40.46 kg/m2 41.97 kg/m2 41.97 kg/m2

## 2020-04-06 ENCOUNTER — Other Ambulatory Visit: Payer: Self-pay

## 2020-04-06 MED ORDER — UNABLE TO FIND: 11 refills | Status: DC

## 2020-04-07 ENCOUNTER — Ambulatory Visit (INDEPENDENT_AMBULATORY_CARE_PROVIDER_SITE_OTHER): Payer: Medicare Other

## 2020-04-07 ENCOUNTER — Other Ambulatory Visit: Payer: Self-pay

## 2020-04-07 DIAGNOSIS — Z23 Encounter for immunization: Secondary | ICD-10-CM

## 2020-04-07 DIAGNOSIS — I10 Essential (primary) hypertension: Secondary | ICD-10-CM | POA: Diagnosis not present

## 2020-04-07 DIAGNOSIS — E785 Hyperlipidemia, unspecified: Secondary | ICD-10-CM | POA: Diagnosis not present

## 2020-04-07 DIAGNOSIS — R7301 Impaired fasting glucose: Secondary | ICD-10-CM | POA: Diagnosis not present

## 2020-04-07 DIAGNOSIS — E559 Vitamin D deficiency, unspecified: Secondary | ICD-10-CM | POA: Diagnosis not present

## 2020-04-08 LAB — CMP14+EGFR
ALT: 17 IU/L (ref 0–44)
AST: 18 IU/L (ref 0–40)
Albumin/Globulin Ratio: 1.2 (ref 1.2–2.2)
Albumin: 4.3 g/dL (ref 4.0–5.0)
Alkaline Phosphatase: 102 IU/L (ref 44–121)
BUN/Creatinine Ratio: 13 (ref 9–20)
BUN: 12 mg/dL (ref 6–24)
Bilirubin Total: 0.6 mg/dL (ref 0.0–1.2)
CO2: 24 mmol/L (ref 20–29)
Calcium: 9.7 mg/dL (ref 8.7–10.2)
Chloride: 98 mmol/L (ref 96–106)
Creatinine, Ser: 0.89 mg/dL (ref 0.76–1.27)
GFR calc Af Amer: 119 mL/min/{1.73_m2} (ref >59)
GFR calc non Af Amer: 103 mL/min/{1.73_m2} (ref >59)
Globulin, Total: 3.7 g/dL (ref 1.5–4.5)
Glucose: 98 mg/dL (ref 65–99)
Potassium: 4.3 mmol/L (ref 3.5–5.2)
Sodium: 136 mmol/L (ref 134–144)
Total Protein: 8 g/dL (ref 6.0–8.5)

## 2020-04-08 LAB — LIPID PANEL
Chol/HDL Ratio: 3.2 ratio (ref 0.0–5.0)
Cholesterol, Total: 200 mg/dL — ABNORMAL HIGH (ref 100–199)
HDL: 63 mg/dL (ref >39)
LDL Chol Calc (NIH): 129 mg/dL — ABNORMAL HIGH (ref 0–99)
Triglycerides: 43 mg/dL (ref 0–149)
VLDL Cholesterol Cal: 8 mg/dL (ref 5–40)

## 2020-04-08 LAB — VITAMIN D 25 HYDROXY (VIT D DEFICIENCY, FRACTURES): Vit D, 25-Hydroxy: 33.7 ng/mL (ref 30.0–100.0)

## 2020-04-08 LAB — HEMOGLOBIN A1C
Est. average glucose Bld gHb Est-mCnc: 114 mg/dL
Hgb A1c MFr Bld: 5.6 % (ref 4.8–5.6)

## 2020-04-22 ENCOUNTER — Ambulatory Visit: Payer: Medicare Other | Admitting: Orthopedic Surgery

## 2020-05-03 ENCOUNTER — Ambulatory Visit: Payer: Medicare Other | Admitting: Orthopedic Surgery

## 2020-05-06 ENCOUNTER — Ambulatory Visit (INDEPENDENT_AMBULATORY_CARE_PROVIDER_SITE_OTHER): Payer: Medicare Other

## 2020-05-06 ENCOUNTER — Ambulatory Visit (INDEPENDENT_AMBULATORY_CARE_PROVIDER_SITE_OTHER): Payer: Medicare Other | Admitting: Orthopedic Surgery

## 2020-05-06 DIAGNOSIS — M25371 Other instability, right ankle: Secondary | ICD-10-CM

## 2020-05-06 DIAGNOSIS — M79671 Pain in right foot: Secondary | ICD-10-CM

## 2020-05-07 ENCOUNTER — Encounter: Payer: Self-pay | Admitting: Orthopedic Surgery

## 2020-05-07 NOTE — Progress Notes (Signed)
Office Visit Note   Patient: Jamie Frost           Date of Birth: 29-Dec-1975           MRN: [id number] Visit Date: 05/06/2020              Requested by: Kerri Perches, MD [address] [place],  [place] [address] PCP: Kerri Perches, MD  Chief Complaint  Patient presents with  . Right Foot - Pain      HPI: Patient is a 45 year old gentleman who is seen for evaluation of his right foot.  Patient currently has been wearing a double upright brace to provide a stable foot.  Patient states he still has instability and is falling even with using his cane due to the instability and muscle imbalance of the right ankle.  Patient is status post left foot tibial calcaneal fusion he is able to ambulate in a regular shoe on the left foot and states that his foot feels stable with ambulation.     Assessment & Plan: Visit Diagnoses:  1. Pain in right foot   2. Instability of right ankle joint     Plan: Discussed with the patient and his family the surgical option to provide a stable right foot would be to proceed with a tibial calcaneal fusion on the right with an anterior plate similar to what was performed on the left.  Risks and benefits were discussed need for protected weightbearing for 1 month.  Patient and family state they understand would like to proceed at this time.  Follow-Up Instructions: Return if symptoms worsen or fail to improve, for Follow-up 1 week after surgery.Gaylord Shih Exam  Patient is alert, oriented, no adenopathy, well-dressed, normal affect, normal respiratory effort. Examination patient has a strong dorsalis pedis pulse he has no knee pain with range of motion or palpation there is no effusion of his knees.  Patient has a muscle imbalance to the right ankle with over pulling of the anterior tibial tendon causing a mechanical varus and supination of the forefoot.  Despite patient's double upright braces he is unable to maintain a stable  plantigrade foot for ambulation.  Imaging: XR Foot Complete Right  Result Date: 05/07/2020 Three-view radiographs of the right foot shows no fractures no joint dislocation.  No images are attached to the encounter.  Labs: Lab Results  Component Value Date   HGBA1C 5.6 04/07/2020   HGBA1C 5.7 (H) 09/17/2019   HGBA1C 5.7 (H) 07/04/2018     Lab Results  Component Value Date   ALBUMIN 4.3 04/07/2020   ALBUMIN 4.4 09/17/2019   ALBUMIN 4.2 07/04/2018    No results found for: MG Lab Results  Component Value Date   VD25OH 33.7 04/07/2020   VD25OH 39.6 09/17/2019   VD25OH 22.4 (L) 07/04/2018    No results found for: PREALBUMIN CBC EXTENDED Latest Ref Rng & Units 09/17/2019 08/07/2018 07/04/2018  WBC 3.4 - 10.8 x10E3/uL 9.6 9.2 9.4  RBC 4.14 - 5.80 x10E6/uL 4.94 4.70 4.82  HGB 13.0 - 17.7 g/dL 19.1 12.5(L) 13.0  HCT 37.5 - 51.0 % 42.6 42.1 42.6  PLT 150 - 450 x10E3/uL 264 241 276  NEUTROABS 1.7 - 7.7 K/uL - - -  LYMPHSABS 0.7 - 4.0 K/uL - - -     There is no height or weight on file to calculate BMI.  Orders:  Orders Placed This Encounter  Procedures  . XR Foot Complete Right  No orders of the defined types were placed in this encounter.    Procedures: No procedures performed  Clinical Data: No additional findings.  ROS:  All other systems negative, except as noted in the HPI. Review of Systems  Objective: Vital Signs: There were no vitals taken for this visit.  Specialty Comments:  No specialty comments available.  PMFS History: Patient Active Problem List   Diagnosis Date Noted  . Acquired equinovarus deformity of left foot   . Body mass index 40.0-44.9, adult (HCC) 07/16/2018  . Spastic diplegic cerebral palsy (HCC) 07/16/2018  . Equinus contracture of left ankle 07/16/2018  . Cavovarus deformity of foot, acquired, left 07/16/2018  . Recurrent falls while walking 07/10/2018  . Prediabetes 08/12/2016  . Pulmonary hypoplasia 09/02/2010  .  GENERALIZED ANXIETY DISORDER 10/11/2008  . Hyperlipidemia LDL goal <100 09/23/2007  . Morbid obesity (HCC) 09/23/2007  . Essential hypertension 09/23/2007  . ALLERGIC RHINITIS, SEASONAL 09/23/2007   Past Medical History:  Diagnosis Date  . Allergic rhinitis, seasonal   . Arthritis   . Diabetes mellitus without complication (HCC)   . Hyperlipidemia   . Hypertension   . Impaired fasting glucose 12/09/2009  . Obesity   . Pulmonary hypoplasia     Family History  Problem Relation Age of Onset  . Obesity Mother   . Hypertension Mother   . Arthritis Mother     Past Surgical History:  Procedure Laterality Date  . ANKLE FUSION Left 08/09/2018   Procedure: LEFT TIBIO-CALCANEAL FUSION;  Surgeon: Nadara Mustard, MD;  Location: [facility];  Service: Orthopedics;  Laterality: Left;  . bilateral inguinal hernia    . I/D abcess on back  2007   1 WEEK   . right great toe surgery     Social History   Occupational History  . Not on file  Tobacco Use  . Smoking status: Never Smoker  . Smokeless tobacco: Never Used  Vaping Use  . Vaping Use: Never used  Substance and Sexual Activity  . Alcohol use: No  . Drug use: No  . Sexual activity: Never

## 2020-06-09 ENCOUNTER — Other Ambulatory Visit: Payer: Self-pay

## 2020-06-22 ENCOUNTER — Inpatient Hospital Stay (HOSPITAL_COMMUNITY)
Admission: RE | Admit: 2020-06-22 | Discharge: 2020-06-22 | Disposition: A | Discharge status: Home / Self Care | Payer: Medicare Other | Source: Ambulatory Visit

## 2020-06-22 ENCOUNTER — Encounter (HOSPITAL_COMMUNITY): Payer: Self-pay

## 2020-06-22 ENCOUNTER — Other Ambulatory Visit: Payer: Self-pay | Admitting: Physician Assistant

## 2020-06-22 NOTE — Progress Notes (Signed)
PCP - Dr Syliva Overman Cardiologist - n/a  Chest x-ray - n/a EKG - 06/23/20 Stress Test - n/a ECHO - n/a Cardiac Cath - n/a  Fasting Blood Sugar - 100s Checks Blood Sugar 2 times a day Diabetes Type 2, no meds  . If your blood sugar is less than 70 mg/dL, you will need to treat for low blood sugar: o Treat a low blood sugar (less than 70 mg/dL) with  cup of clear juice (cranberry or apple), 4 glucose tablets, OR glucose gel. o Recheck blood sugar in 15 minutes after treatment (to make sure it is greater than 70 mg/dL). If your blood sugar is not greater than 70 mg/dL on recheck, call [phone] for further instructions.  ERAS: Clear liquids til 4:30 am DOS  STOP now taking any Aspirin (unless otherwise instructed by your surgeon), Aleve, Naproxen, Ibuprofen, Motrin, Advil, Goody's, BC's, all herbal medications, fish oil, and all vitamins.   Coronavirus Screening Covid test at PAT appt. Do you have any of the following symptoms:  Cough yes/no: No Fever (>100.94F)  yes/no: No Runny nose yes/no: No Sore throat yes/no: No Difficulty breathing/shortness of breath  yes/no: No  Have you traveled in the last 14 days and where? yes/no: No  Patient verbalized understanding of instructions that were given to them at the PAT appointment.

## 2020-06-22 NOTE — Pre-Procedure Instructions (Signed)
Surgical Instructions    Your procedure is scheduled on Friday, April 22nd at 7:30 A.M.  Report to [facility] Main Entrance "A" at 5:30 A.M., then check in with the Admitting office.  Call this number if you have problems the morning of surgery:  [phone]   If you have any questions prior to your surgery date call [phone]: Open Monday-Friday 8am-4pm    Remember:  Do not eat after midnight the night before your surgery  You may drink clear liquids until 4:30 a.m. the morning of your surgery.   Clear liquids allowed are: Water, Non-Citrus Juices (without pulp), Carbonated Beverages, Clear Tea, Black Coffee Only, and Gatorade. (Please use diet or sugar-free options).  Please complete the 10oz bottle of water that was given to you by 4:30 A.M. the morning of surgery.    Take these medicines the morning of surgery with A SIP OF WATER  busPIRone (BUSPAR)  loratadine (CLARITIN)   Take these AS NEEDED the morning of surgery Albuterol nebulizer-as needed for wheezing fluticasone (FLONASE) -as needed for allergies  As of today, STOP taking any Aspirin (unless otherwise instructed by your surgeon) Aleve, Naproxen, Ibuprofen, Motrin, Advil, Goody's, BC's, all herbal medications, fish oil, and all vitamins.  HOW TO MANAGE YOUR DIABETES BEFORE AND AFTER SURGERY  Why is it important to control my blood sugar before and after surgery? . Improving blood sugar levels before and after surgery helps healing and can limit problems. . A way of improving blood sugar control is eating a healthy diet by: o  Eating less sugar and carbohydrates o  Increasing activity/exercise o  Talking with your doctor about reaching your blood sugar goals . High blood sugars (greater than 180 mg/dL) can raise your risk of infections and slow your recovery, so you will need to focus on controlling your diabetes during the weeks before surgery. . Make sure that the doctor who takes care of your diabetes knows  about your planned surgery including the date and location.  How do I manage my blood sugar before surgery? . Check your blood sugar at least 4 times a day, starting 2 days before surgery, to make sure that the level is not too high or low. . Check your blood sugar the morning of your surgery when you wake up and every 2 hours until you get to the Short Stay unit. o If your blood sugar is less than 70 mg/dL, you will need to treat for low blood sugar: - Do not take insulin. - Treat a low blood sugar (less than 70 mg/dL) with  cup of clear juice (cranberry or apple), 4 glucose tablets, OR glucose gel. - Recheck blood sugar in 15 minutes after treatment (to make sure it is greater than 70 mg/dL). If your blood sugar is not greater than 70 mg/dL on recheck, call [phone] for further instructions. . Report your blood sugar to the short stay nurse when you get to Short Stay.  . If you are admitted to the hospital after surgery: o Your blood sugar will be checked by the staff and you will probably be given insulin after surgery (instead of oral diabetes medicines) to make sure you have good blood sugar levels. o The goal for blood sugar control after surgery is 80-180 mg/dL.                      Do NOT Smoke (Tobacco/Vaping) or drink Alcohol 24 hours prior to your procedure.  If you  use a CPAP at night, you may bring all equipment for your overnight stay.   Contacts, glasses, piercing's, hearing aid's, dentures or partials may not be worn into surgery, please bring cases for these belongings.    For patients admitted to the hospital, discharge time will be determined by your treatment team.   Patients discharged the day of surgery will not be allowed to drive home, and someone needs to stay with them for 24 hours.    Special instructions:   [facility]- Preparing For Surgery  Before surgery, you can play an important role. Because skin is not sterile, your skin needs to be as free of  germs as possible. You can reduce the number of germs on your skin by washing with CHG (chlorahexidine gluconate) Soap before surgery.  CHG is an antiseptic cleaner which kills germs and bonds with the skin to continue killing germs even after washing.    Oral Hygiene is also important to reduce your risk of infection.  Remember - BRUSH YOUR TEETH THE MORNING OF SURGERY WITH YOUR REGULAR TOOTHPASTE  Please do not use if you have an allergy to CHG or antibacterial soaps. If your skin becomes reddened/irritated stop using the CHG.  Do not shave (including legs and underarms) for at least 48 hours prior to first CHG shower. It is OK to shave your face.  Please follow these instructions carefully.   1. Shower the NIGHT BEFORE SURGERY and the MORNING OF SURGERY  2. If you chose to wash your hair, wash your hair first as usual with your normal shampoo.  3. After you shampoo, rinse your hair and body thoroughly to remove the shampoo.  4. Use CHG Soap as you would any other liquid soap. You can apply CHG directly to the skin and wash gently with a scrungie or a clean washcloth.   5. Apply the CHG Soap to your body ONLY FROM THE NECK DOWN.  Do not use on open wounds or open sores. Avoid contact with your eyes, ears, mouth and genitals (private parts). Wash Face and genitals (private parts)  with your normal soap.   6. Wash thoroughly, paying special attention to the area where your surgery will be performed.  7. Thoroughly rinse your body with warm water from the neck down.  8. DO NOT shower/wash with your normal soap after using and rinsing off the CHG Soap.  9. Pat yourself dry with a CLEAN TOWEL.  10. Wear CLEAN PAJAMAS to bed the night before surgery  11. Place CLEAN SHEETS on your bed the night before your surgery  12. DO NOT SLEEP WITH PETS.   Day of Surgery: Shower with CHG soap. Do not wear jewelry. Do not wear lotions, powders, colognes, or deodorant. Men may shave face and  neck. Do not bring valuables to the hospital. [facility] is not responsible for any belongings or valuables. Wear Clean/Comfortable clothing the morning of surgery Remember to brush your teeth WITH YOUR REGULAR TOOTHPASTE.   Please read over the following fact sheets that you were given.

## 2020-06-23 ENCOUNTER — Encounter (HOSPITAL_COMMUNITY): Payer: Self-pay

## 2020-06-23 ENCOUNTER — Other Ambulatory Visit: Payer: Self-pay

## 2020-06-23 ENCOUNTER — Encounter (HOSPITAL_COMMUNITY)
Admission: RE | Admit: 2020-06-23 | Discharge: 2020-06-23 | Disposition: A | Discharge status: Home / Self Care | Payer: Medicare Other | Source: Ambulatory Visit | Attending: Orthopedic Surgery | Admitting: Orthopedic Surgery

## 2020-06-23 DIAGNOSIS — Z20822 Contact with and (suspected) exposure to covid-19: Secondary | ICD-10-CM | POA: Diagnosis not present

## 2020-06-23 DIAGNOSIS — Z01818 Encounter for other preprocedural examination: Secondary | ICD-10-CM | POA: Insufficient documentation

## 2020-06-23 HISTORY — DX: Unspecified injury of right lower leg, initial encounter: S89.91XA

## 2020-06-23 HISTORY — DX: Dependence on other enabling machines and devices: Z99.89

## 2020-06-23 LAB — TYPE AND SCREEN
ABO/RH(D): A NEG
Antibody Screen: NEGATIVE

## 2020-06-23 LAB — BASIC METABOLIC PANEL
Anion gap: 5 (ref 5–15)
BUN: 12 mg/dL (ref 6–20)
CO2: 30 mmol/L (ref 22–32)
Calcium: 9.2 mg/dL (ref 8.9–10.3)
Chloride: 102 mmol/L (ref 98–111)
Creatinine, Ser: 0.98 mg/dL (ref 0.61–1.24)
GFR, Estimated: >60 mL/min (ref >60)
Glucose, Bld: 95 mg/dL (ref 70–99)
Potassium: 4.4 mmol/L (ref 3.5–5.1)
Sodium: 137 mmol/L (ref 135–145)

## 2020-06-23 LAB — CBC
HCT: 43.1 % (ref 39.0–52.0)
Hemoglobin: 12.7 g/dL — ABNORMAL LOW (ref 13.0–17.0)
MCH: 26.2 pg (ref 26.0–34.0)
MCHC: 29.5 g/dL — ABNORMAL LOW (ref 30.0–36.0)
MCV: 89 fL (ref 80.0–100.0)
Platelets: 245 10*3/uL (ref 150–400)
RBC: 4.84 MIL/uL (ref 4.22–5.81)
RDW: 15.2 % (ref 11.5–15.5)
WBC: 8.4 10*3/uL (ref 4.0–10.5)
nRBC: 0 % (ref 0.0–0.2)

## 2020-06-23 LAB — HEMOGLOBIN A1C
Hgb A1c MFr Bld: 5.7 % — ABNORMAL HIGH (ref 4.8–5.6)
Mean Plasma Glucose: 116.89 mg/dL

## 2020-06-23 LAB — SURGICAL PCR SCREEN
MRSA, PCR: POSITIVE — AB
Staphylococcus aureus: POSITIVE — AB

## 2020-06-23 LAB — SARS CORONAVIRUS 2 (TAT 6-24 HRS): SARS Coronavirus 2: NEGATIVE

## 2020-06-24 MED ORDER — VANCOMYCIN HCL 1500 MG/300ML IV SOLN
1500.0000 mg | INTRAVENOUS | Status: AC
  Administered 2020-06-25: 1500 mg via INTRAVENOUS
  Filled 2020-06-24: qty 300

## 2020-06-24 MED ORDER — DEXTROSE 5 % IV SOLN
3.0000 g | INTRAVENOUS | Status: AC
  Administered 2020-06-25: 3 g via INTRAVENOUS
  Filled 2020-06-24: qty 3

## 2020-06-24 NOTE — Anesthesia Preprocedure Evaluation (Addendum)
Anesthesia Evaluation  Patient identified by MRN, date of birth, ID band Patient awake    Reviewed: Allergy & Precautions, NPO status , Patient's Chart, lab work & pertinent test results  History of Anesthesia Complications Negative for: history of anesthetic complications  Airway Mallampati: II  TM Distance: >3 FB Neck ROM: Full    Dental   Pulmonary neg pulmonary ROS,  Pulmonary hypoplasia   Pulmonary exam normal        Cardiovascular hypertension, Pt. on medications Normal cardiovascular exam     Neuro/Psych Spastic CP    GI/Hepatic negative GI ROS, Neg liver ROS,   Endo/Other  diabetes, Type 2Morbid obesity  Renal/GU negative Renal ROS  negative genitourinary   Musculoskeletal  (+) Arthritis ,   Abdominal   Peds  Hematology  (+) anemia ,   Anesthesia Other Findings   Reproductive/Obstetrics                            Anesthesia Physical Anesthesia Plan  ASA: III  Anesthesia Plan: MAC   Post-op Pain Management:  Regional for Post-op pain   Induction: Intravenous  PONV Risk Score and Plan: 1 and Propofol infusion, TIVA and Treatment may vary due to age or medical condition  Airway Management Planned: Natural Airway, Nasal Cannula and Simple Face Mask  Additional Equipment: None  Intra-op Plan:   Post-operative Plan:   Informed Consent: I have reviewed the patients History and Physical, chart, labs and discussed the procedure including the risks, benefits and alternatives for the proposed anesthesia with the patient or authorized representative who has indicated his/her understanding and acceptance.     Dental advisory given  Plan Discussed with:   Anesthesia Plan Comments:        Anesthesia Quick Evaluation

## 2020-06-25 ENCOUNTER — Ambulatory Visit (HOSPITAL_COMMUNITY): Payer: Medicare Other | Admitting: Physician Assistant

## 2020-06-25 ENCOUNTER — Other Ambulatory Visit: Payer: Self-pay | Admitting: Physician Assistant

## 2020-06-25 ENCOUNTER — Ambulatory Visit (HOSPITAL_COMMUNITY): Payer: Medicare Other

## 2020-06-25 ENCOUNTER — Other Ambulatory Visit: Payer: Self-pay

## 2020-06-25 ENCOUNTER — Ambulatory Visit (HOSPITAL_COMMUNITY)
Admission: RE | Admit: 2020-06-25 | Discharge: 2020-06-25 | Disposition: A | Discharge status: Home / Self Care | Payer: Medicare Other | Attending: Orthopedic Surgery | Admitting: Orthopedic Surgery

## 2020-06-25 ENCOUNTER — Ambulatory Visit (HOSPITAL_COMMUNITY): Payer: Medicare Other | Admitting: Anesthesiology

## 2020-06-25 ENCOUNTER — Encounter (HOSPITAL_COMMUNITY)
Admission: RE | Disposition: A | Discharge status: Home / Self Care | Payer: Self-pay | Source: Home / Self Care | Attending: Orthopedic Surgery

## 2020-06-25 ENCOUNTER — Encounter (HOSPITAL_COMMUNITY): Payer: Self-pay | Admitting: Orthopedic Surgery

## 2020-06-25 DIAGNOSIS — Z981 Arthrodesis status: Secondary | ICD-10-CM | POA: Insufficient documentation

## 2020-06-25 DIAGNOSIS — Z79899 Other long term (current) drug therapy: Secondary | ICD-10-CM | POA: Insufficient documentation

## 2020-06-25 DIAGNOSIS — M25371 Other instability, right ankle: Secondary | ICD-10-CM | POA: Diagnosis not present

## 2020-06-25 DIAGNOSIS — I10 Essential (primary) hypertension: Secondary | ICD-10-CM | POA: Diagnosis not present

## 2020-06-25 DIAGNOSIS — Z882 Allergy status to sulfonamides status: Secondary | ICD-10-CM | POA: Insufficient documentation

## 2020-06-25 DIAGNOSIS — Z6841 Body Mass Index (BMI) 40.0 and over, adult: Secondary | ICD-10-CM | POA: Insufficient documentation

## 2020-06-25 DIAGNOSIS — M216X1 Other acquired deformities of right foot: Secondary | ICD-10-CM | POA: Diagnosis not present

## 2020-06-25 DIAGNOSIS — M79671 Pain in right foot: Secondary | ICD-10-CM | POA: Diagnosis not present

## 2020-06-25 DIAGNOSIS — E785 Hyperlipidemia, unspecified: Secondary | ICD-10-CM | POA: Diagnosis not present

## 2020-06-25 HISTORY — PX: ANKLE FUSION: SHX5718

## 2020-06-25 LAB — GLUCOSE, CAPILLARY
Glucose-Capillary: 92 mg/dL (ref 70–99)
Glucose-Capillary: 89 mg/dL (ref 70–99)
Glucose-Capillary: 72 mg/dL (ref 70–99)
Glucose-Capillary: 101 mg/dL — ABNORMAL HIGH (ref 70–99)

## 2020-06-25 LAB — ABO/RH: ABO/RH(D): A NEG

## 2020-06-25 SURGERY — ANKLE FUSION
Anesthesia: Monitor Anesthesia Care | Site: Ankle | Laterality: Right

## 2020-06-25 MED ORDER — CHLORHEXIDINE GLUCONATE 0.12 % MT SOLN: 15.0000 mL | Freq: Once | OROMUCOSAL | Status: DC

## 2020-06-25 MED ORDER — OXYCODONE HCL 5 MG PO TABS: 5.0000 mg | ORAL_TABLET | Freq: Once | ORAL | Status: DC | PRN

## 2020-06-25 MED ORDER — ONDANSETRON HCL 4 MG/2ML IJ SOLN: 4.0000 mg | Freq: Once | INTRAMUSCULAR | Status: DC | PRN

## 2020-06-25 MED ORDER — ASPIRIN EC 325 MG PO TBEC: 325.0000 mg | DELAYED_RELEASE_TABLET | Freq: Every day | ORAL | 0 refills | Status: DC

## 2020-06-25 MED ORDER — FENTANYL CITRATE (PF) 250 MCG/5ML IJ SOLN
INTRAMUSCULAR | Status: DC | PRN
  Administered 2020-06-25 (×6): 25 ug via INTRAVENOUS

## 2020-06-25 MED ORDER — ORAL CARE MOUTH RINSE: 15.0000 mL | Freq: Once | OROMUCOSAL | Status: DC

## 2020-06-25 MED ORDER — MIDAZOLAM HCL 2 MG/2ML IJ SOLN
INTRAMUSCULAR | Status: DC | PRN
  Administered 2020-06-25: 2 mg via INTRAVENOUS

## 2020-06-25 MED ORDER — FENTANYL CITRATE (PF) 250 MCG/5ML IJ SOLN
INTRAMUSCULAR | Status: AC
  Filled 2020-06-25: qty 5

## 2020-06-25 MED ORDER — FENTANYL CITRATE (PF) 100 MCG/2ML IJ SOLN: 25.0000 ug | INTRAMUSCULAR | Status: DC | PRN

## 2020-06-25 MED ORDER — CHLORHEXIDINE GLUCONATE 0.12 % MT SOLN
OROMUCOSAL | Status: AC
  Administered 2020-06-25: 15 mL
  Filled 2020-06-25: qty 15

## 2020-06-25 MED ORDER — LACTATED RINGERS IV SOLN: INTRAVENOUS | Status: DC

## 2020-06-25 MED ORDER — PROPOFOL 500 MG/50ML IV EMUL
INTRAVENOUS | Status: DC | PRN
  Administered 2020-06-25: 40 ug/kg/min via INTRAVENOUS

## 2020-06-25 MED ORDER — PROPOFOL 10 MG/ML IV BOLUS
INTRAVENOUS | Status: AC
  Filled 2020-06-25: qty 20

## 2020-06-25 MED ORDER — PROPOFOL 10 MG/ML IV BOLUS
INTRAVENOUS | Status: DC | PRN
  Administered 2020-06-25 (×2): 20 mg via INTRAVENOUS

## 2020-06-25 MED ORDER — MIDAZOLAM HCL 2 MG/2ML IJ SOLN
INTRAMUSCULAR | Status: AC
  Filled 2020-06-25: qty 2

## 2020-06-25 MED ORDER — OXYCODONE HCL 5 MG/5ML PO SOLN: 5.0000 mg | Freq: Once | ORAL | Status: DC | PRN

## 2020-06-25 MED ORDER — 0.9 % SODIUM CHLORIDE (POUR BTL) OPTIME
TOPICAL | Status: DC | PRN
  Administered 2020-06-25: 1000 mL

## 2020-06-25 MED ORDER — OXYCODONE-ACETAMINOPHEN 5-325 MG PO TABS: 1.0000 | ORAL_TABLET | ORAL | 0 refills | Status: DC | PRN

## 2020-06-25 MED ORDER — PROPOFOL 10 MG/ML IV BOLUS
INTRAVENOUS | Status: AC
  Filled 2020-06-25: qty 40

## 2020-06-25 MED ORDER — AMISULPRIDE (ANTIEMETIC) 5 MG/2ML IV SOLN
10.0000 mg | Freq: Once | INTRAVENOUS | Status: DC | PRN
Start: 2020-06-25 — End: 2020-06-25

## 2020-06-25 MED ORDER — ROPIVACAINE HCL 5 MG/ML IJ SOLN
INTRAMUSCULAR | Status: DC | PRN
  Administered 2020-06-25 (×2): 30 mL via PERINEURAL

## 2020-06-25 SURGICAL SUPPLY — 59 items
BANDAGE ESMARK 6X9 LF (GAUZE/BANDAGES/DRESSINGS) ×1 IMPLANT
BIT DRILL CANNULATED 4.6 (BIT) ×1 IMPLANT
BIT DRILL LONG 3.1X160 (DRILL) IMPLANT
BIT DRILL SOLID LONG 2.8X160 (DRILL) IMPLANT
BLADE AVERAGE 25X9 (BLADE) ×2 IMPLANT
BLADE SAW SGTL MED 73X18.5 STR (BLADE) ×2 IMPLANT
BLADE SURG 10 STRL SS (BLADE) IMPLANT
BNDG CMPR 9X6 STRL LF SNTH (GAUZE/BANDAGES/DRESSINGS)
BNDG COHESIVE 4X5 TAN STRL (GAUZE/BANDAGES/DRESSINGS) ×2 IMPLANT
BNDG ESMARK 6X9 LF (GAUZE/BANDAGES/DRESSINGS)
BNDG GAUZE ELAST 4 BULKY (GAUZE/BANDAGES/DRESSINGS) ×2 IMPLANT
COVER MAYO STAND STRL (DRAPES) IMPLANT
COVER SURGICAL LIGHT HANDLE (MISCELLANEOUS) ×4 IMPLANT
COVER WAND RF STERILE (DRAPES) ×1 IMPLANT
DRAPE DERMATAC (DRAPES) ×3 IMPLANT
DRAPE OEC MINIVIEW 54X84 (DRAPES) ×1 IMPLANT
DRAPE U-SHAPE 47X51 STRL (DRAPES) ×2 IMPLANT
DRILL LONG 3.1X160 (DRILL) ×2
DRILL SOLID LONG 2.8X160 (DRILL) ×2
DRSG ADAPTIC 3X8 NADH LF (GAUZE/BANDAGES/DRESSINGS) ×1 IMPLANT
DURAPREP 26ML APPLICATOR (WOUND CARE) ×2 IMPLANT
ELECT REM PT RETURN 9FT ADLT (ELECTROSURGICAL) ×2
ELECTRODE REM PT RTRN 9FT ADLT (ELECTROSURGICAL) ×1 IMPLANT
GAUZE SPONGE 4X4 12PLY STRL (GAUZE/BANDAGES/DRESSINGS) ×1 IMPLANT
GLOVE BIOGEL PI IND STRL 9 (GLOVE) ×1 IMPLANT
GLOVE BIOGEL PI INDICATOR 9 (GLOVE) ×1
GLOVE SURG ORTHO 9.0 STRL STRW (GLOVE) ×2 IMPLANT
GOWN STRL REUS W/ TWL LRG LVL3 (GOWN DISPOSABLE) ×1 IMPLANT
GOWN STRL REUS W/ TWL XL LVL3 (GOWN DISPOSABLE) ×1 IMPLANT
GOWN STRL REUS W/TWL LRG LVL3 (GOWN DISPOSABLE) ×2
GOWN STRL REUS W/TWL XL LVL3 (GOWN DISPOSABLE) ×2
K-WIRE SINGLE TROCAR 2.3X230 (WIRE) ×4
KIT BASIN OR (CUSTOM PROCEDURE TRAY) ×2 IMPLANT
KIT TURNOVER KIT B (KITS) ×2 IMPLANT
KWIRE SINGLE TROCAR 2.3X230 (WIRE) IMPLANT
NS IRRIG 1000ML POUR BTL (IV SOLUTION) ×2 IMPLANT
PACK ORTHO EXTREMITY (CUSTOM PROCEDURE TRAY) ×2 IMPLANT
PAD ARMBOARD 7.5X6 YLW CONV (MISCELLANEOUS) ×4 IMPLANT
PLATE ANT TTC FLAT RT (Plate) ×1 IMPLANT
PREVENA RESTOR AXIOFORM 29X28 (GAUZE/BANDAGES/DRESSINGS) ×1 IMPLANT
SCREW CANN HDLS THRD 7X70 (Screw) ×1 IMPLANT
SCREW CANN MED THRD 70X7 (Screw) IMPLANT
SCREW CANN STHRD HDLS 7X84 (Screw) ×1 IMPLANT
SCREW CANN THRD 7X70 (Screw) ×2 IMPLANT
SCREW LOCK PLATE R3 4.2X24 (Screw) ×1 IMPLANT
SCREW LOCK PLATE R3 4.2X28 (Screw) ×2 IMPLANT
SCREW LOCK PLATE SB 4.5X30 (Screw) ×1 IMPLANT
SCREW LOCK PLT 28X4.2XNS GRLL (Screw) IMPLANT
SCREW NLOCK PLATE SB 4.5X30 (Screw) ×2 IMPLANT
SCREW NONLOCK PLATE R3 4.2X24 (Screw) ×1 IMPLANT
SPONGE LAP 18X18 RF (DISPOSABLE) ×2 IMPLANT
SUCTION FRAZIER HANDLE 10FR (MISCELLANEOUS) ×2
SUCTION TUBE FRAZIER 10FR DISP (MISCELLANEOUS) ×1 IMPLANT
SUT ETHILON 2 0 PSLX (SUTURE) ×6 IMPLANT
TOWEL GREEN STERILE (TOWEL DISPOSABLE) ×2 IMPLANT
TOWEL GREEN STERILE FF (TOWEL DISPOSABLE) ×2 IMPLANT
TUBE CONNECTING 12X1/4 (SUCTIONS) ×2 IMPLANT
WATER STERILE IRR 1000ML POUR (IV SOLUTION) ×1 IMPLANT
YANKAUER SUCT BULB TIP NO VENT (SUCTIONS) ×1 IMPLANT

## 2020-06-25 NOTE — Op Note (Signed)
06/25/2020  9:06 AM  PATIENT:  Jamie Frost    PRE-OPERATIVE DIAGNOSIS:  Collapse Right Ankle  POST-OPERATIVE DIAGNOSIS:  Same  PROCEDURE:  RIGHT TIBIOCALCANEAL FUSION C-arm fluoroscopy to verify reduction Axial form wound VAC  SURGEON:  Nadara Mustard, MD  PHYSICIAN ASSISTANT:None ANESTHESIA:   General  PREOPERATIVE INDICATIONS:  Jamie Frost is a  45 y.o. male with a diagnosis of Collapse Right Ankle who failed conservative measures and elected for surgical management.    The risks benefits and alternatives were discussed with the patient preoperatively including but not limited to the risks of infection, bleeding, nerve injury, cardiopulmonary complications, the need for revision surgery, among others, and the patient was willing to proceed.  OPERATIVE IMPLANTS: Paragon tibial talar calcaneal anterior plate  @ENCIMAGES @  OPERATIVE FINDINGS: C-arm fluoroscopy verified reduction in both AP and lateral planes.  OPERATIVE PROCEDURE: Patient was brought the operating room after undergoing regional anesthetic.  After adequate levels anesthesia were obtained patient's right lower extremity was prepped using DuraPrep draped into a sterile field a timeout was called.  A dorsal midline incision was made over the ankle this was carried down through the interval between the anterior tibial tendon and the EHL tendon.  Subperiosteal dissection was used to expose the ankle joint.  A oscillating saw was used to resect bone perpendicular to the long axis of the tibia from both the tibia and the talus.  A curette and osteotome was used to further debride the joint the ankle was reduced at 80 degrees and 1 compression screw was placed into the talar head and a proximal compression screw was placed into the tibia.  A separate compression screw was placed obliquely across the tibial talar calcaneal joint headless screw to further compress the joint.  A separate screw was then placed through the  plate from the tibial talar calcaneus to further compress the joint.  Compression screws were placed proximally and distally see arthroscopy verified alignment the joint was at 90 degrees.  Foot was plantigrade.  The wound was irrigated with normal saline incision was closed using 2-0 nylon a Prevena axial form wound VAC was applied outlined with derma tack this was covered with Covan this had a good suction fit patient was taken to the PACU in stable condition   DISCHARGE PLANNING:  Antibiotic duration: Preoperative antibiotics  Weightbearing: Touchdown weightbearing on the right  Pain medication: Prescription for Percocet  Dressing care/ Wound VAC: Continue wound VAC for 1 week  Ambulatory devices: Crutches  Discharge to: Home.  Follow-up: In the office 1 week post operative.

## 2020-06-25 NOTE — Anesthesia Procedure Notes (Signed)
Anesthesia Regional Block: Popliteal block   Pre-Anesthetic Checklist: ,, timeout performed, Correct Patient, Correct Site, Correct Laterality, Correct Procedure, Correct Position, site marked, Risks and benefits discussed,  Surgical consent,  Pre-op evaluation,  At surgeon's request and post-op pain management  Laterality: Right  Prep: chloraprep       Needles:  Injection technique: Single-shot  Needle Type: Echogenic Stimulator Needle     Needle Length: 10cm  Needle Gauge: 20     Additional Needles:   Procedures:,,,, ultrasound used (permanent image in chart),,,,  Narrative:  Start time: 06/25/2020 7:09 AM End time: 06/25/2020 7:13 AM  Performed by: Personally  Anesthesiologist: Lucretia Kern, MD  Additional Notes: Standard monitors applied. Skin prepped. Good needle visualization with ultrasound. Injection made in 5cc increments with no resistance to injection. Patient tolerated the procedure well.

## 2020-06-25 NOTE — Anesthesia Postprocedure Evaluation (Signed)
Anesthesia Post Note  Patient: Jamie Frost  Procedure(s) Performed: RIGHT TIBIOCALCANEAL FUSION (Right Ankle)     Patient location during evaluation: PACU Anesthesia Type: MAC Level of consciousness: awake and alert Pain management: pain level controlled Vital Signs Assessment: post-procedure vital signs reviewed and stable Respiratory status: spontaneous breathing, nonlabored ventilation and respiratory function stable Cardiovascular status: blood pressure returned to baseline and stable Postop Assessment: no apparent nausea or vomiting Anesthetic complications: no   No complications documented.  Last Vitals:  Vitals:   06/25/20 0915 06/25/20 0930  BP: 114/78 114/78  Pulse: 81   Resp: 18   Temp:  36.5 C  SpO2: 100%     Last Pain:  Vitals:   06/25/20 0930  TempSrc:   PainSc: 0-No pain                 Lidia Collum

## 2020-06-25 NOTE — Progress Notes (Signed)
Orthopedic Tech Progress Note Patient Details:  Jamie Frost [birth year] [id number]  Ortho Devices Type of Ortho Device: CAM walker Ortho Device/Splint Location: RLE Ortho Device/Splint Interventions: Ordered,Application,Adjustment   Post Interventions Patient Tolerated: Well Instructions Provided: Care of device,Adjustment of device,Poper ambulation with device   Jamie Frost 06/25/2020, 10:02 AM

## 2020-06-25 NOTE — Anesthesia Procedure Notes (Signed)
Anesthesia Regional Block: Adductor canal block   Pre-Anesthetic Checklist: ,, timeout performed, Correct Patient, Correct Site, Correct Laterality, Correct Procedure, Correct Position, site marked, Risks and benefits discussed,  Surgical consent,  Pre-op evaluation,  At surgeon's request and post-op pain management  Laterality: Right  Prep: chloraprep       Needles:  Injection technique: Single-shot  Needle Type: Echogenic Stimulator Needle     Needle Length: 10cm  Needle Gauge: 20     Additional Needles:   Procedures:,,,, ultrasound used (permanent image in chart),,,,  Narrative:  Start time: 06/25/2020 7:05 AM End time: 06/25/2020 7:09 AM Injection made incrementally with aspirations every 5 mL.  Performed by: Personally  Anesthesiologist: Lucretia Kern, MD  Additional Notes: Standard monitors applied. Skin prepped. Good needle visualization with ultrasound. Injection made in 5cc increments with no resistance to injection. Patient tolerated the procedure well.

## 2020-06-25 NOTE — H&P (Signed)
Jamie Frost is an 45 y.o. male.   Chief Complaint: Right Foot pain and instability HPI: HPI: Patient is a 45 year old gentleman who is seen for evaluation of his right foot.  Patient currently has been wearing a double upright brace to provide a stable foot.  Patient states he still has instability and is falling even with using his cane due to the instability and muscle imbalance of the right ankle.  Patient is status post left foot tibial calcaneal fusion he is able to ambulate in a regular shoe on the left foot and states that his foot feels stable with ambulation.    Past Medical History:  Diagnosis Date  . Allergic rhinitis, seasonal   . Ambulates with cane    straight  . Arthritis   . Diabetes mellitus without complication (HCC)    type 2 - no meds  . Hyperlipidemia   . Hypertension   . Impaired fasting glucose 12/09/2009  . Obesity   . Pulmonary hypoplasia   . Right leg injury    wears brace LLE - per pt "foot turns over without brace"    Past Surgical History:  Procedure Laterality Date  . ANKLE FUSION Left 08/09/2018   Procedure: LEFT TIBIO-CALCANEAL FUSION;  Surgeon: Nadara Mustard, MD;  Location: [facility] OR;  Service: Orthopedics;  Laterality: Left;  . bilateral inguinal hernia    . I/D abcess on back  2007   1 WEEK   . right great toe surgery      Family History  Problem Relation Age of Onset  . Obesity Mother   . Hypertension Mother   . Arthritis Mother    Social History:  reports that he has never smoked. He has never used smokeless tobacco. He reports that he does not drink alcohol and does not use drugs.  Allergies:  Allergies  Allergen Reactions  . Sulfonamide Derivatives     UNSPECIFIED REACTION     Medications Prior to Admission  Medication Sig Dispense Refill  . albuterol (PROVENTIL) (2.5 MG/3ML) 0.083% nebulizer solution Take 3 mLs (2.5 mg total) by nebulization every 6 (six) hours as needed. One vial per nebulizer every 6 to 8 hours prn (Patient  taking differently: Take 2.5 mg by nebulization every 6 (six) hours as needed for wheezing. One vial per nebulizer every 6 to 8 hours prn) 75 mL 4  . benazepril (LOTENSIN) 10 MG tablet TAKE ONE (1) TABLET EACH DAY (Patient taking differently: Take 10 mg by mouth.) 90 tablet 1  . busPIRone (BUSPAR) 15 MG tablet TAKE 1/2 TABLET TWICE DAILY (Patient taking differently: Take 15 mg by mouth 2 (two) times daily.) 90 tablet 1  . Elite Nebulizer System MISC by Does not apply route.    . fluticasone (FLONASE) 50 MCG/ACT nasal spray USE 2 SPRAYS IN EACH NOSTRIL DAILY (Patient taking differently: Place 2 sprays into both nostrils every 6 (six) hours as needed for allergies.) 48 g 0  . loratadine (CLARITIN) 10 MG tablet TAKE ONE (1) TABLET EACH DAY (Patient taking differently: Take 10 mg by mouth daily.) 90 tablet 1  . potassium chloride SA (KLOR-CON) 20 MEQ tablet TAKE ONE (1) TABLET EACH DAY (Patient taking differently: Take 20 mEq by mouth daily.) 90 tablet 1  . simvastatin (ZOCOR) 40 MG tablet Take 1 tablet (40 mg total) by mouth at bedtime. 90 tablet 1  . Vitamin D, Ergocalciferol, (DRISDOL) 1.25 MG (50000 UNIT) CAPS capsule TAKE 1 CAPSULE EVERY WEEK (Patient taking differently: Take 50,000 Units  by mouth every 7 (seven) days. Wednesday) 12 capsule 3  . UNABLE TO FIND 2XL pullups- use daily as needed 100 each 11    Results for orders placed or performed during the hospital encounter of 06/25/20 (from the past 48 hour(s))  Glucose, capillary     Status: None   Collection Time: 06/25/20  5:55 AM  Result Value Ref Range   Glucose-Capillary 92 70 - 99 mg/dL    Comment: Glucose reference range applies only to samples taken after fasting for at least 8 hours.   No results found.  Review of Systems  All other systems reviewed and are negative.   Blood pressure (!) 144/99, pulse 88, temperature 98.2 F (36.8 C), temperature source Oral, resp. rate 18, height 5\' 4"  (1.626 m), weight 118.1 kg, SpO2 100  %. Physical Exam  Patient is alert, oriented, no adenopathy, well-dressed, normal affect, normal respiratory effort. Examination patient has a strong dorsalis pedis pulse he has no knee pain with range of motion or palpation there is no effusion of his knees.  Patient has a muscle imbalance to the right ankle with over pulling of the anterior tibial tendon causing a mechanical varus and supination of the forefoot.  Despite patient's double upright braces he is unable to maintain a stable plantigrade foot for ambulation. Heart RRR Lungs clear Assessment/Plan 1. Pain in right foot   2. Instability of right ankle joint     Plan: Discussed with the patient and his family the surgical option to provide a stable right foot would be to proceed with a tibial calcaneal fusion on the right with an anterior plate similar to what was performed on the left.  Risks and benefits were discussed need for protected weightbearing for 1 month.  Patient and family state they understand would like to proceed at this time.   Legacy Carrender, [place] 06/25/2020, 6:29 AM

## 2020-06-25 NOTE — Transfer of Care (Signed)
Immediate Anesthesia Transfer of Care Note  Patient: Jamie Frost  Procedure(s) Performed: RIGHT TIBIOCALCANEAL FUSION (Right Ankle)  Patient Location: PACU  Anesthesia Type:MAC combined with regional for post-op pain  Level of Consciousness: drowsy, patient cooperative and responds to stimulation  Airway & Oxygen Therapy: Patient Spontanous Breathing  Post-op Assessment: Report given to RN and Post -op Vital signs reviewed and stable  Post vital signs: Reviewed and stable  Last Vitals:  Vitals Value Taken Time  BP 136/78 06/25/20 0859  Temp    Pulse 85 06/25/20 0900  Resp 14 06/25/20 0900  SpO2 99 % 06/25/20 0900  Vitals shown include unvalidated device data.  Last Pain:  Vitals:   06/25/20 0616  TempSrc:   PainSc: 0-No pain      Patients Stated Pain Goal: 2 (50/38/88 2800)  Complications: No complications documented.

## 2020-06-25 NOTE — Interval H&P Note (Signed)
History and Physical Interval Note:  06/25/2020 6:47 AM  Jamie Frost  has presented today for surgery, with the diagnosis of Collapse Right Ankle.  The various methods of treatment have been discussed with the patient and family. After consideration of risks, benefits and other options for treatment, the patient has consented to  Procedure(s): RIGHT TIBIOCALCANEAL FUSION (Right) as a surgical intervention.  The patient's history has been reviewed, patient examined, no change in status, stable for surgery.  I have reviewed the patient's chart and labs.  Questions were answered to the patient's satisfaction.     Nadara Mustard

## 2020-06-25 NOTE — Anesthesia Procedure Notes (Signed)
Procedure Name: MAC Date/Time: 06/25/2020 7:36 AM Performed by: Janace Litten, CRNA Pre-anesthesia Checklist: Patient identified, Emergency Drugs available, Suction available and Patient being monitored Patient Re-evaluated:Patient Re-evaluated prior to induction Oxygen Delivery Method: Nasal cannula

## 2020-06-28 ENCOUNTER — Encounter (HOSPITAL_COMMUNITY): Payer: Self-pay | Admitting: Orthopedic Surgery

## 2020-07-02 DIAGNOSIS — R531 Weakness: Secondary | ICD-10-CM | POA: Diagnosis not present

## 2020-07-02 DIAGNOSIS — W19XXXA Unspecified fall, initial encounter: Secondary | ICD-10-CM | POA: Diagnosis not present

## 2020-07-02 DIAGNOSIS — R5381 Other malaise: Secondary | ICD-10-CM | POA: Diagnosis not present

## 2020-07-13 ENCOUNTER — Ambulatory Visit (INDEPENDENT_AMBULATORY_CARE_PROVIDER_SITE_OTHER): Payer: Medicare Other | Admitting: Orthopedic Surgery

## 2020-07-13 ENCOUNTER — Encounter: Payer: Self-pay | Admitting: Orthopedic Surgery

## 2020-07-13 DIAGNOSIS — Z981 Arthrodesis status: Secondary | ICD-10-CM

## 2020-07-13 NOTE — Progress Notes (Signed)
Office Visit Note   Patient: Jamie Frost           Date of Birth: Mar 26, 1975           MRN: [id number] Visit Date: 07/13/2020              Requested by: Kerri Perches, MD [address], [address] [place],  [place] [address] PCP: Kerri Perches, MD  Chief Complaint  Patient presents with  . Right Ankle - Routine Post Op    06/25/20 right tib-cal fusion       HPI: Patient is a 45 year old gentleman who was seen for first follow-up appointment after tibial calcaneal fusion he is about 2-1/2 weeks out patient states he forgot about his appointment he is here with his family.  Patient still had the wound VAC in place with the canister being full.  Assessment & Plan: Visit Diagnoses:  1. S/P ankle fusion     Plan: Patient will start Dial soap cleansing tomorrow dry dressing change continue fracture boot nonweightbearing.  Patient will follow-up in a week for wound care anticipate we can take the stitches out in a week or 2.  Obtain radiographs about 4 weeks out from  Follow-Up Instructions: Return in about 1 week (around 07/20/2020).   Ortho Exam  Patient is alert, oriented, no adenopathy, well-dressed, normal affect, normal respiratory effort. Examination the original wound VAC dressing is removed there is significant maceration of the entire foot and ankle.  The incision is well approximated there is no wound dehiscence no ischemic changes no cellulitis no drainage no signs of infection.  Imaging: No results found. No images are attached to the encounter.  Labs: Lab Results  Component Value Date   HGBA1C 5.7 (H) 06/23/2020   HGBA1C 5.6 04/07/2020   HGBA1C 5.7 (H) 09/17/2019   Surgery.  Lab Results  Component Value Date   ALBUMIN 4.3 04/07/2020   ALBUMIN 4.4 09/17/2019   ALBUMIN 4.2 07/04/2018    No results found for: MG Lab Results  Component Value Date   VD25OH 33.7 04/07/2020   VD25OH 39.6 09/17/2019   VD25OH 22.4 (L) 07/04/2018    No  results found for: PREALBUMIN CBC EXTENDED Latest Ref Rng & Units 06/23/2020 09/17/2019 08/07/2018  WBC 4.0 - 10.5 K/uL 8.4 9.6 9.2  RBC 4.22 - 5.81 MIL/uL 4.84 4.94 4.70  HGB 13.0 - 17.0 g/dL 12.7(L) 13.2 12.5(L)  HCT 39.0 - 52.0 % 43.1 42.6 42.1  PLT 150 - 400 K/uL 245 264 241  NEUTROABS 1.7 - 7.7 K/uL - - -  LYMPHSABS 0.7 - 4.0 K/uL - - -     There is no height or weight on file to calculate BMI.  Orders:  No orders of the defined types were placed in this encounter.  No orders of the defined types were placed in this encounter.    Procedures: No procedures performed  Clinical Data: No additional findings.  ROS:  All other systems negative, except as noted in the HPI. Review of Systems  Objective: Vital Signs: There were no vitals taken for this visit.  Specialty Comments:  No specialty comments available.  PMFS History: Patient Active Problem List   Diagnosis Date Noted  . Ankle instability, right   . Acquired equinovarus deformity of left foot   . Body mass index 40.0-44.9, adult (HCC) 07/16/2018  . Spastic diplegic cerebral palsy (HCC) 07/16/2018  . Equinus contracture of left ankle 07/16/2018  . Cavovarus deformity of foot,  acquired, left 07/16/2018  . Recurrent falls while walking 07/10/2018  . Prediabetes 08/12/2016  . Pulmonary hypoplasia 09/02/2010  . GENERALIZED ANXIETY DISORDER 10/11/2008  . Hyperlipidemia LDL goal <100 09/23/2007  . Morbid obesity (HCC) 09/23/2007  . Essential hypertension 09/23/2007  . ALLERGIC RHINITIS, SEASONAL 09/23/2007   Past Medical History:  Diagnosis Date  . Allergic rhinitis, seasonal   . Ambulates with cane    straight  . Arthritis   . Diabetes mellitus without complication (HCC)    type 2 - no meds  . Hyperlipidemia   . Hypertension   . Impaired fasting glucose 12/09/2009  . Obesity   . Pulmonary hypoplasia   . Right leg injury    wears brace LLE - per pt "foot turns over without brace"    Family History   Problem Relation Age of Onset  . Obesity Mother   . Hypertension Mother   . Arthritis Mother     Past Surgical History:  Procedure Laterality Date  . ANKLE FUSION Left 08/09/2018   Procedure: LEFT TIBIO-CALCANEAL FUSION;  Surgeon: Nadara Mustard, MD;  Location: [facility] OR;  Service: Orthopedics;  Laterality: Left;  . ANKLE FUSION Right 06/25/2020   Procedure: RIGHT TIBIOCALCANEAL FUSION;  Surgeon: Nadara Mustard, MD;  Location: [facility] OR;  Service: Orthopedics;  Laterality: Right;  . bilateral inguinal hernia    . I/D abcess on back  2007   1 WEEK   . right great toe surgery     Social History   Occupational History  . Not on file  Tobacco Use  . Smoking status: Never Smoker  . Smokeless tobacco: Never Used  Vaping Use  . Vaping Use: Never used  Substance and Sexual Activity  . Alcohol use: No  . Drug use: No  . Sexual activity: Not Currently

## 2020-07-21 ENCOUNTER — Ambulatory Visit: Payer: Medicare Other | Admitting: Family

## 2020-07-22 ENCOUNTER — Ambulatory Visit (INDEPENDENT_AMBULATORY_CARE_PROVIDER_SITE_OTHER): Payer: Medicare Other

## 2020-07-22 ENCOUNTER — Ambulatory Visit (INDEPENDENT_AMBULATORY_CARE_PROVIDER_SITE_OTHER): Payer: Medicare Other | Admitting: Physician Assistant

## 2020-07-22 ENCOUNTER — Ambulatory Visit: Payer: Medicare Other | Admitting: Physician Assistant

## 2020-07-22 ENCOUNTER — Encounter: Payer: Self-pay | Admitting: Physician Assistant

## 2020-07-22 DIAGNOSIS — Z981 Arthrodesis status: Secondary | ICD-10-CM

## 2020-07-22 NOTE — Progress Notes (Signed)
Office Visit Note   Patient: Jamie Frost           Date of Birth: 20-Aug-1975           MRN: [id number] Visit Date: 07/22/2020              Requested by: Kerri Perches, MD [address] [place],  [place] [address] PCP: Kerri Perches, MD  Chief Complaint  Patient presents with  . Right Ankle - Routine Post Op    06/25/20 right tibial calcaneal fusion       HPI: Patient is a pleasant 45 year old gentleman who is cared for by his aunt.  He is 3 weeks status post tibial calcaneal fusion.  He denies fever, chills, or calf pain  Assessment & Plan: Visit Diagnoses:  1. S/P ankle fusion     Plan: Continue cleansing and nonweightbearing follow-up in 1 week for removal of remainder sutures  Follow-Up Instructions: No follow-ups on file.   Ortho Exam  Patient is alert, oriented, no adenopathy, well-dressed, normal affect, normal respiratory effort. Examination he has moderate soft tissue swelling but no erythema or cellulitis.  Wound is well opposed and healed 100% in all places but in the very central portion of the wound at the angle of the ankle.  All sutures but 2 were removed compartments are soft and compressible  Imaging: No results found. No images are attached to the encounter.  Labs: Lab Results  Component Value Date   HGBA1C 5.7 (H) 06/23/2020   HGBA1C 5.6 04/07/2020   HGBA1C 5.7 (H) 09/17/2019     Lab Results  Component Value Date   ALBUMIN 4.3 04/07/2020   ALBUMIN 4.4 09/17/2019   ALBUMIN 4.2 07/04/2018    No results found for: MG Lab Results  Component Value Date   VD25OH 33.7 04/07/2020   VD25OH 39.6 09/17/2019   VD25OH 22.4 (L) 07/04/2018    No results found for: PREALBUMIN CBC EXTENDED Latest Ref Rng & Units 06/23/2020 09/17/2019 08/07/2018  WBC 4.0 - 10.5 K/uL 8.4 9.6 9.2  RBC 4.22 - 5.81 MIL/uL 4.84 4.94 4.70  HGB 13.0 - 17.0 g/dL 12.7(L) 13.2 12.5(L)  HCT 39.0 - 52.0 % 43.1 42.6 42.1  PLT 150 - 400 K/uL 245 264 241   NEUTROABS 1.7 - 7.7 K/uL - - -  LYMPHSABS 0.7 - 4.0 K/uL - - -     There is no height or weight on file to calculate BMI.  Orders:  Orders Placed This Encounter  Procedures  . XR Ankle Complete Right   No orders of the defined types were placed in this encounter.    Procedures: No procedures performed  Clinical Data: No additional findings.  ROS:  All other systems negative, except as noted in the HPI. Review of Systems  Objective: Vital Signs: There were no vitals taken for this visit.  Specialty Comments:  No specialty comments available.  PMFS History: Patient Active Problem List   Diagnosis Date Noted  . Ankle instability, right   . Acquired equinovarus deformity of left foot   . Body mass index 40.0-44.9, adult (HCC) 07/16/2018  . Spastic diplegic cerebral palsy (HCC) 07/16/2018  . Equinus contracture of left ankle 07/16/2018  . Cavovarus deformity of foot, acquired, left 07/16/2018  . Recurrent falls while walking 07/10/2018  . Prediabetes 08/12/2016  . Pulmonary hypoplasia 09/02/2010  . GENERALIZED ANXIETY DISORDER 10/11/2008  . Hyperlipidemia LDL goal <100 09/23/2007  . Morbid obesity (HCC) 09/23/2007  .  Essential hypertension 09/23/2007  . ALLERGIC RHINITIS, SEASONAL 09/23/2007   Past Medical History:  Diagnosis Date  . Allergic rhinitis, seasonal   . Ambulates with cane    straight  . Arthritis   . Diabetes mellitus without complication (HCC)    type 2 - no meds  . Hyperlipidemia   . Hypertension   . Impaired fasting glucose 12/09/2009  . Obesity   . Pulmonary hypoplasia   . Right leg injury    wears brace LLE - per pt "foot turns over without brace"    Family History  Problem Relation Age of Onset  . Obesity Mother   . Hypertension Mother   . Arthritis Mother     Past Surgical History:  Procedure Laterality Date  . ANKLE FUSION Left 08/09/2018   Procedure: LEFT TIBIO-CALCANEAL FUSION;  Surgeon: Nadara Mustard, MD;  Location: [facility] OR;   Service: Orthopedics;  Laterality: Left;  . ANKLE FUSION Right 06/25/2020   Procedure: RIGHT TIBIOCALCANEAL FUSION;  Surgeon: Nadara Mustard, MD;  Location: [facility] OR;  Service: Orthopedics;  Laterality: Right;  . bilateral inguinal hernia    . I/D abcess on back  2007   1 WEEK   . right great toe surgery     Social History   Occupational History  . Not on file  Tobacco Use  . Smoking status: Never Smoker  . Smokeless tobacco: Never Used  Vaping Use  . Vaping Use: Never used  Substance and Sexual Activity  . Alcohol use: No  . Drug use: No  . Sexual activity: Not Currently

## 2020-07-29 ENCOUNTER — Ambulatory Visit (INDEPENDENT_AMBULATORY_CARE_PROVIDER_SITE_OTHER): Payer: Medicare Other | Admitting: Physician Assistant

## 2020-07-29 DIAGNOSIS — M25371 Other instability, right ankle: Secondary | ICD-10-CM

## 2020-07-30 ENCOUNTER — Encounter: Payer: Self-pay | Admitting: Physician Assistant

## 2020-07-30 NOTE — Progress Notes (Signed)
Office Visit Note   Patient: Jamie Frost           Date of Birth: 1975-07-22           MRN: [id number] Visit Date: 07/29/2020              Requested by: Kerri Perches, MD [address], [address] [place],  [place] [address] PCP: Kerri Perches, MD  Chief Complaint  Patient presents with  . Right Ankle - Routine Post Op    06/25/20 right tib-cal fusion       HPI: Patient is a 45 year old gentleman who is cared for by his aunt he is 4 weeks status post tibial calcaneal fusion.  He is here for remaining suture removal  Assessment & Plan: Visit Diagnoses: No diagnosis found.  Plan: Continue with weightbearing restrictions follow-up in 3 weeks at which time new radiographs should be taken  Follow-Up Instructions: No follow-ups on file.   Ortho Exam  Patient is alert, oriented, no adenopathy, well-dressed, normal affect, normal respiratory effort. Examination well-healed surgical incision.  Remaining 2 sutures were removed.  No ascending cellulitis no drainage no signs of infection  Imaging: No results found. No images are attached to the encounter.  Labs: Lab Results  Component Value Date   HGBA1C 5.7 (H) 06/23/2020   HGBA1C 5.6 04/07/2020   HGBA1C 5.7 (H) 09/17/2019     Lab Results  Component Value Date   ALBUMIN 4.3 04/07/2020   ALBUMIN 4.4 09/17/2019   ALBUMIN 4.2 07/04/2018    No results found for: MG Lab Results  Component Value Date   VD25OH 33.7 04/07/2020   VD25OH 39.6 09/17/2019   VD25OH 22.4 (L) 07/04/2018    No results found for: PREALBUMIN CBC EXTENDED Latest Ref Rng & Units 06/23/2020 09/17/2019 08/07/2018  WBC 4.0 - 10.5 K/uL 8.4 9.6 9.2  RBC 4.22 - 5.81 MIL/uL 4.84 4.94 4.70  HGB 13.0 - 17.0 g/dL 12.7(L) 13.2 12.5(L)  HCT 39.0 - 52.0 % 43.1 42.6 42.1  PLT 150 - 400 K/uL 245 264 241  NEUTROABS 1.7 - 7.7 K/uL - - -  LYMPHSABS 0.7 - 4.0 K/uL - - -     There is no height or weight on file to calculate BMI.  Orders:  No  orders of the defined types were placed in this encounter.  No orders of the defined types were placed in this encounter.    Procedures: No procedures performed  Clinical Data: No additional findings.  ROS:  All other systems negative, except as noted in the HPI. Review of Systems  Objective: Vital Signs: There were no vitals taken for this visit.  Specialty Comments:  No specialty comments available.  PMFS History: Patient Active Problem List   Diagnosis Date Noted  . Ankle instability, right   . Acquired equinovarus deformity of left foot   . Body mass index 40.0-44.9, adult (HCC) 07/16/2018  . Spastic diplegic cerebral palsy (HCC) 07/16/2018  . Equinus contracture of left ankle 07/16/2018  . Cavovarus deformity of foot, acquired, left 07/16/2018  . Recurrent falls while walking 07/10/2018  . Prediabetes 08/12/2016  . Pulmonary hypoplasia 09/02/2010  . GENERALIZED ANXIETY DISORDER 10/11/2008  . Hyperlipidemia LDL goal <100 09/23/2007  . Morbid obesity (HCC) 09/23/2007  . Essential hypertension 09/23/2007  . ALLERGIC RHINITIS, SEASONAL 09/23/2007   Past Medical History:  Diagnosis Date  . Allergic rhinitis, seasonal   . Ambulates with cane    straight  . Arthritis   .  Diabetes mellitus without complication (HCC)    type 2 - no meds  . Hyperlipidemia   . Hypertension   . Impaired fasting glucose 12/09/2009  . Obesity   . Pulmonary hypoplasia   . Right leg injury    wears brace LLE - per pt "foot turns over without brace"    Family History  Problem Relation Age of Onset  . Obesity Mother   . Hypertension Mother   . Arthritis Mother     Past Surgical History:  Procedure Laterality Date  . ANKLE FUSION Left 08/09/2018   Procedure: LEFT TIBIO-CALCANEAL FUSION;  Surgeon: Nadara Mustard, MD;  Location: [facility] OR;  Service: Orthopedics;  Laterality: Left;  . ANKLE FUSION Right 06/25/2020   Procedure: RIGHT TIBIOCALCANEAL FUSION;  Surgeon: Nadara Mustard, MD;   Location: [facility] OR;  Service: Orthopedics;  Laterality: Right;  . bilateral inguinal hernia    . I/D abcess on back  2007   1 WEEK   . right great toe surgery     Social History   Occupational History  . Not on file  Tobacco Use  . Smoking status: Never Smoker  . Smokeless tobacco: Never Used  Vaping Use  . Vaping Use: Never used  Substance and Sexual Activity  . Alcohol use: No  . Drug use: No  . Sexual activity: Not Currently

## 2020-08-19 ENCOUNTER — Ambulatory Visit: Payer: Medicare Other | Admitting: Orthopedic Surgery

## 2020-09-02 ENCOUNTER — Encounter: Payer: Self-pay | Admitting: Family Medicine

## 2020-09-02 ENCOUNTER — Other Ambulatory Visit: Payer: Self-pay

## 2020-09-02 ENCOUNTER — Ambulatory Visit (INDEPENDENT_AMBULATORY_CARE_PROVIDER_SITE_OTHER): Payer: Medicare Other | Admitting: Family Medicine

## 2020-09-02 VITALS — BP 135/79 | HR 96 | Temp 99.1°F | Resp 20 | Ht 66.0 in | Wt 254.0 lb

## 2020-09-02 DIAGNOSIS — F411 Generalized anxiety disorder: Secondary | ICD-10-CM

## 2020-09-02 DIAGNOSIS — Z125 Encounter for screening for malignant neoplasm of prostate: Secondary | ICD-10-CM

## 2020-09-02 DIAGNOSIS — E785 Hyperlipidemia, unspecified: Secondary | ICD-10-CM | POA: Diagnosis not present

## 2020-09-02 DIAGNOSIS — R7303 Prediabetes: Secondary | ICD-10-CM | POA: Diagnosis not present

## 2020-09-02 DIAGNOSIS — Z1211 Encounter for screening for malignant neoplasm of colon: Secondary | ICD-10-CM

## 2020-09-02 DIAGNOSIS — R7301 Impaired fasting glucose: Secondary | ICD-10-CM

## 2020-09-02 DIAGNOSIS — M21542 Acquired clubfoot, left foot: Secondary | ICD-10-CM | POA: Diagnosis not present

## 2020-09-02 DIAGNOSIS — I10 Essential (primary) hypertension: Secondary | ICD-10-CM | POA: Diagnosis not present

## 2020-09-02 MED ORDER — BUSPIRONE HCL 7.5 MG PO TABS: 7.5000 mg | ORAL_TABLET | Freq: Two times a day (BID) | ORAL | 5 refills | Status: DC

## 2020-09-02 NOTE — Patient Instructions (Addendum)
F/U in October , call if you need me before  Fasting , lipid, cmp and eGFR, tSH, HBA1C   and PSA today 1 week before next visit  You are referred for colonoscopy  Please increase vegetable and reduce sweets and starchy foods  Thanks for choosing [facility], we consider it a privelige to serve you

## 2020-09-06 ENCOUNTER — Encounter: Payer: Self-pay | Admitting: Family Medicine

## 2020-09-06 NOTE — Assessment & Plan Note (Signed)
Patient educated about the importance of limiting  Carbohydrate intake , the need to commit to daily physical activity for a minimum of 30 minutes , and to commit weight loss. The fact that changes in all these areas will reduce or eliminate all together the development of diabetes is stressed.   Diabetic Labs Latest Ref Rng & Units 06/23/2020 04/07/2020 09/17/2019 01/02/2019 08/07/2018  HbA1c 4.8 - 5.6 % 5.7(H) 5.6 5.7(H) - -  Microalbumin Not Estab. ug/mL - - - - -  Micro/Creat Ratio 0.0 - 30.0 mg/g creat - - - - -  Chol 100 - 199 mg/dL - 903(Y) 333 832 -  HDL >39 mg/dL - 63 61 53 -  Calc LDL 0 - 99 mg/dL - 919(T) 98 64 -  Triglycerides 0 - 149 mg/dL - 43 65 49 -  Creatinine 0.61 - 1.24 mg/dL 6.60 6.00 4.59 - 9.77   BP/Weight 09/02/2020 06/25/2020 06/23/2020 04/05/2020 12/15/2019 09/17/2019 12/24/2018  Systolic BP 135 114 121 - 111 414 120  Diastolic BP 79 78 78 - 69 69 78  Wt. (Lbs) 254 260.3 275.9 266.1 276 276 279  BMI 41 44.68 47.36 40.46 41.97 41.97 42.42   Foot/eye exam completion dates 01/18/2016 09/30/2014  Foot Form Completion Done Done

## 2020-09-06 NOTE — Progress Notes (Signed)
Jamie Frost     MRN: [id number]      DOB: Jul 14, 1975   HPI Jamie Frost is here for follow up and re-evaluation of chronic medical conditions, medication management and review of any available recent lab and radiology data.  Preventive health is updated, specifically  Cancer screening and Immunization.   Questions or concerns regarding consultations or procedures which the PT has had in the interim are  addressed. The PT denies any adverse reactions to current medications since the last visit.  Lost his Mother a few months ago and is living with his Aunt who has been responsible for him for past several years, at times he gets depressed but doing well overall There are no specific complaints   ROS Denies recent fever or chills. Denies sinus pressure, nasal congestion, ear pain or sore throat. Denies chest congestion, productive cough or wheezing. Denies chest pains, palpitations and leg swelling Denies abdominal pain, nausea, vomiting,diarrhea or constipation.   Denies dysuria, frequency, hesitancy or incontinence. Denies joint pain, swelling and has mild limitation in mobility. Denies headaches, seizures, numbness, or tingling. Denies  uncontrolled depression, anxiety or insomnia. Denies skin break down or rash.   PE  BP 135/79 (BP Location: Right Arm, Patient Position: Sitting, Cuff Size: Large)   Pulse 96   Temp 99.1 F (37.3 C)   Resp 20   Ht 5\' 6"  (1.676 m)   Wt 254 lb (115.2 kg)   SpO2 95%   BMI 41.00 kg/m   Patient alert and oriented and in no cardiopulmonary distress.  HEENT: No facial asymmetry, EOMI,     Neck supple .  Chest: Clear to auscultation bilaterally.  CVS: S1, S2 no murmurs, no S3.Regular rate.  ABD: Soft non tender.   Ext: No edema  MS: Adequate though reduced ROM spine, shoulders, hips and knees.  Skin: Intact, no ulcerations or rash noted.  Psych: Good eye contact, normal affect. Memory intact not anxious or depressed  appearing.  CNS: CN 2-12 intact, power,  normal throughout.no focal deficits noted.   Assessment & Plan  Essential hypertension Controlled, no change in medication DASH diet and commitment to daily physical activity for a minimum of 30 minutes discussed and encouraged, as a part of hypertension management. The importance of attaining a healthy weight is also discussed.  BP/Weight 09/02/2020 06/25/2020 06/23/2020 04/05/2020 12/15/2019 09/17/2019 12/24/2018  Systolic BP 135 114 121 - 111 12/26/2018 120  Diastolic BP 79 78 78 - 69 69 78  Wt. (Lbs) 254 260.3 275.9 266.1 276 276 279  BMI 41 44.68 47.36 40.46 41.97 41.97 42.42       Morbid obesity (HCC) Improved  Patient re-educated about  the importance of commitment to a  minimum of 150 minutes of exercise per week as able.  The importance of healthy food choices with portion control discussed, as well as eating regularly and within a 12 hour window most days. The need to choose "clean , green" food 50 to 75% of the time is discussed, as well as to make water the primary drink and set a goal of 64 ounces water daily.    Weight /BMI 09/02/2020 06/25/2020 06/23/2020  WEIGHT 254 lb 260 lb 4.8 oz 275 lb 14.4 oz  HEIGHT 5\' 6"  5\' 4"  5\' 4"   BMI 41 kg/m2 44.68 kg/m2 47.36 kg/m2      Hyperlipidemia LDL goal <100 Hyperlipidemia:Low fat diet discussed and encouraged.   Lipid Panel  Lab Results  Component Value Date  CHOL 200 (H) 04/07/2020   HDL 63 04/07/2020   LDLCALC 129 (H) 04/07/2020   TRIG 43 04/07/2020   CHOLHDL 3.2 04/07/2020     Updated lab needed at/ before next visit. Needs to reduce fat in diet  Acquired equinovarus deformity of left foot Followed and managed by Podiatry  GENERALIZED ANXIETY DISORDER Controlled, no change in medication   Prediabetes Patient educated about the importance of limiting  Carbohydrate intake , the need to commit to daily physical activity for a minimum of 30 minutes , and to commit weight  loss. The fact that changes in all these areas will reduce or eliminate all together the development of diabetes is stressed.   Diabetic Labs Latest Ref Rng & Units 06/23/2020 04/07/2020 09/17/2019 01/02/2019 08/07/2018  HbA1c 4.8 - 5.6 % 5.7(H) 5.6 5.7(H) - -  Microalbumin Not Estab. ug/mL - - - - -  Micro/Creat Ratio 0.0 - 30.0 mg/g creat - - - - -  Chol 100 - 199 mg/dL - 325(Q) 982 641 -  HDL >39 mg/dL - 63 61 53 -  Calc LDL 0 - 99 mg/dL - 583(E) 98 64 -  Triglycerides 0 - 149 mg/dL - 43 65 49 -  Creatinine 0.61 - 1.24 mg/dL 9.40 7.68 0.88 - 1.10   BP/Weight 09/02/2020 06/25/2020 06/23/2020 04/05/2020 12/15/2019 09/17/2019 12/24/2018  Systolic BP 135 114 121 - 111 315 120  Diastolic BP 79 78 78 - 69 69 78  Wt. (Lbs) 254 260.3 275.9 266.1 276 276 279  BMI 41 44.68 47.36 40.46 41.97 41.97 42.42   Foot/eye exam completion dates 01/18/2016 09/30/2014  Foot Form Completion Done Done

## 2020-09-06 NOTE — Assessment & Plan Note (Signed)
Followed and managed by Podiatry

## 2020-09-06 NOTE — Assessment & Plan Note (Signed)
Hyperlipidemia:Low fat diet discussed and encouraged.   Lipid Panel  Lab Results  Component Value Date   CHOL 200 (H) 04/07/2020   HDL 63 04/07/2020   LDLCALC 129 (H) 04/07/2020   TRIG 43 04/07/2020   CHOLHDL 3.2 04/07/2020     Updated lab needed at/ before next visit. Needs to reduce fat in diet

## 2020-09-06 NOTE — Assessment & Plan Note (Signed)
Controlled, no change in medication DASH diet and commitment to daily physical activity for a minimum of 30 minutes discussed and encouraged, as a part of hypertension management. The importance of attaining a healthy weight is also discussed.  BP/Weight 09/02/2020 06/25/2020 06/23/2020 04/05/2020 12/15/2019 09/17/2019 12/24/2018  Systolic BP 135 114 121 - 111 093 120  Diastolic BP 79 78 78 - 69 69 78  Wt. (Lbs) 254 260.3 275.9 266.1 276 276 279  BMI 41 44.68 47.36 40.46 41.97 41.97 42.42

## 2020-09-06 NOTE — Assessment & Plan Note (Signed)
Improved  Patient re-educated about  the importance of commitment to a  minimum of 150 minutes of exercise per week as able.  The importance of healthy food choices with portion control discussed, as well as eating regularly and within a 12 hour window most days. The need to choose "clean , green" food 50 to 75% of the time is discussed, as well as to make water the primary drink and set a goal of 64 ounces water daily.    Weight /BMI 09/02/2020 06/25/2020 06/23/2020  WEIGHT 254 lb 260 lb 4.8 oz 275 lb 14.4 oz  HEIGHT 5\' 6"  5\' 4"  5\' 4"   BMI 41 kg/m2 44.68 kg/m2 47.36 kg/m2

## 2020-09-06 NOTE — Assessment & Plan Note (Signed)
Controlled, no change in medication  

## 2020-09-08 ENCOUNTER — Encounter: Payer: Self-pay | Admitting: Internal Medicine

## 2020-09-23 ENCOUNTER — Encounter: Payer: Self-pay | Admitting: Orthopedic Surgery

## 2020-09-23 ENCOUNTER — Other Ambulatory Visit: Payer: Self-pay | Admitting: Orthopedic Surgery

## 2020-09-23 ENCOUNTER — Ambulatory Visit (INDEPENDENT_AMBULATORY_CARE_PROVIDER_SITE_OTHER): Payer: Medicare Other

## 2020-09-23 ENCOUNTER — Ambulatory Visit (INDEPENDENT_AMBULATORY_CARE_PROVIDER_SITE_OTHER): Payer: Medicare Other | Admitting: Physician Assistant

## 2020-09-23 DIAGNOSIS — Z981 Arthrodesis status: Secondary | ICD-10-CM | POA: Diagnosis not present

## 2020-09-23 NOTE — Progress Notes (Signed)
Office Visit Note   Patient: Jamie Frost           Date of Birth: [birth year]           MRN: [id number] Visit Date: 09/23/2020              Requested by: Kerri Perches, MD [address], [address] [place],  [place] [address] PCP: Kerri Perches, MD  Chief Complaint  Patient presents with   Right Ankle - Follow-up      HPI: Patient is a pleasant 45 year old gentleman who is cared for by his aunt.  He is now 3 months status post right tibial calcaneal fusion.  He is still wearing a fracture boot.  His aunt is concerned because he seems to lose balance and fall quite a bit.  Assessment & Plan: Visit Diagnoses:  1. S/P ankle fusion     Plan: Prescription was provided for upright brace.  Double upright brace.  Have also provided a prescription to physical therapy at any pen follow-up in 8 weeks discussed also wearing a supportive tennis sneaker  Follow-Up Instructions: No follow-ups on file.   Ortho Exam  Patient is alert, oriented, no adenopathy, well-dressed, normal affect, normal respiratory effort. Examination demonstrates well-healed surgical incision mild soft tissue swelling no tenderness to palpation ankle range of motion and subtalar range of motion is fairly rigid.  He does have some slight forefoot drop.  No evidence of any infective process  Imaging: XR Ankle Complete Right  Result Date: 09/23/2020 3 views of his ankle demonstrate postoperative changes consistent with tibial calcaneal fusion.  He has well-maintained alignment hardware is intact and in place  No images are attached to the encounter.  Labs: Lab Results  Component Value Date   HGBA1C 5.7 (H) 06/23/2020   HGBA1C 5.6 04/07/2020   HGBA1C 5.7 (H) 09/17/2019     Lab Results  Component Value Date   ALBUMIN 4.3 04/07/2020   ALBUMIN 4.4 09/17/2019   ALBUMIN 4.2 07/04/2018    No results found for: MG Lab Results  Component Value Date   VD25OH 33.7 04/07/2020   VD25OH 39.6  09/17/2019   VD25OH 22.4 (L) 07/04/2018    No results found for: PREALBUMIN CBC EXTENDED Latest Ref Rng & Units 06/23/2020 09/17/2019 08/07/2018  WBC 4.0 - 10.5 K/uL 8.4 9.6 9.2  RBC 4.22 - 5.81 MIL/uL 4.84 4.94 4.70  HGB 13.0 - 17.0 g/dL 12.7(L) 13.2 12.5(L)  HCT 39.0 - 52.0 % 43.1 42.6 42.1  PLT 150 - 400 K/uL 245 264 241  NEUTROABS 1.7 - 7.7 K/uL - - -  LYMPHSABS 0.7 - 4.0 K/uL - - -     There is no height or weight on file to calculate BMI.  Orders:  Orders Placed This Encounter  Procedures   Ambulatory referral to Physical Therapy   No orders of the defined types were placed in this encounter.    Procedures: No procedures performed  Clinical Data: No additional findings.  ROS:  All other systems negative, except as noted in the HPI. Review of Systems  Objective: Vital Signs: There were no vitals taken for this visit.  Specialty Comments:  No specialty comments available.  PMFS History: Patient Active Problem List   Diagnosis Date Noted   Ankle instability, right    Acquired equinovarus deformity of left foot    Body mass index 40.0-44.9, adult (HCC) 07/16/2018   Spastic diplegic cerebral palsy (HCC) 07/16/2018   Equinus contracture of  left ankle 07/16/2018   Cavovarus deformity of foot, acquired, left 07/16/2018   Recurrent falls while walking 07/10/2018   Prediabetes 08/12/2016   Pulmonary hypoplasia 09/02/2010   GENERALIZED ANXIETY DISORDER 10/11/2008   Hyperlipidemia LDL goal <100 09/23/2007   Morbid obesity (HCC) 09/23/2007   Essential hypertension 09/23/2007   ALLERGIC RHINITIS, SEASONAL 09/23/2007   Past Medical History:  Diagnosis Date   Allergic rhinitis, seasonal    Ambulates with cane    straight   Arthritis    Diabetes mellitus without complication (HCC)    type 2 - no meds   Hyperlipidemia    Hypertension    Impaired fasting glucose 12/09/2009   Obesity    Pulmonary hypoplasia    Right leg injury    wears brace LLE - per pt "foot  turns over without brace"    Family History  Problem Relation Age of Onset   Obesity Mother    Hypertension Mother    Arthritis Mother     Past Surgical History:  Procedure Laterality Date   ANKLE FUSION Left 08/09/2018   Procedure: LEFT TIBIO-CALCANEAL FUSION;  Surgeon: Nadara Mustard, MD;  Location: [facility] OR;  Service: Orthopedics;  Laterality: Left;   ANKLE FUSION Right 06/25/2020   Procedure: RIGHT TIBIOCALCANEAL FUSION;  Surgeon: Nadara Mustard, MD;  Location: [facility] OR;  Service: Orthopedics;  Laterality: Right;   bilateral inguinal hernia     I/D abcess on back  2007   1 WEEK    right great toe surgery     Social History   Occupational History   Not on file  Tobacco Use   Smoking status: Never   Smokeless tobacco: Never  Vaping Use   Vaping Use: Never used  Substance and Sexual Activity   Alcohol use: No   Drug use: No   Sexual activity: Not Currently

## 2020-10-19 ENCOUNTER — Telehealth: Payer: Self-pay | Admitting: Orthopedic Surgery

## 2020-10-19 NOTE — Telephone Encounter (Signed)
I called number below x 3 states that this is not a working number. Will hold message and try again.

## 2020-10-19 NOTE — Telephone Encounter (Signed)
Pt's aunt Kathie Rhodes called requesting a call back from Autumn F. Please call her about pt at [phone]

## 2020-10-20 NOTE — Telephone Encounter (Signed)
Called number again today and number is not a working number.

## 2020-11-03 ENCOUNTER — Other Ambulatory Visit: Payer: Self-pay

## 2020-11-03 ENCOUNTER — Telehealth: Payer: Self-pay

## 2020-11-03 MED ORDER — VITAMIN D (ERGOCALCIFEROL) 1.25 MG (50000 UNIT) PO CAPS: 50000.0000 [IU] | ORAL_CAPSULE | ORAL | 1 refills | Status: DC

## 2020-11-03 MED ORDER — SIMVASTATIN 40 MG PO TABS: 40.0000 mg | ORAL_TABLET | Freq: Every day | ORAL | 1 refills | Status: DC

## 2020-11-03 MED ORDER — BUSPIRONE HCL 7.5 MG PO TABS: 7.5000 mg | ORAL_TABLET | Freq: Two times a day (BID) | ORAL | 1 refills | Status: DC

## 2020-11-03 MED ORDER — POTASSIUM CHLORIDE CRYS ER 20 MEQ PO TBCR: 20.0000 meq | EXTENDED_RELEASE_TABLET | Freq: Every day | ORAL | 1 refills | Status: DC

## 2020-11-03 MED ORDER — LORATADINE 10 MG PO TABS: 10.0000 mg | ORAL_TABLET | Freq: Every day | ORAL | 1 refills | Status: DC

## 2020-11-03 MED ORDER — BENAZEPRIL HCL 10 MG PO TABS: ORAL_TABLET | ORAL | 1 refills | Status: DC

## 2020-11-03 MED ORDER — FLUTICASONE PROPIONATE 50 MCG/ACT NA SUSP: 2.0000 | Freq: Every day | NASAL | 1 refills | Status: DC

## 2020-11-03 NOTE — Telephone Encounter (Signed)
Med sent.

## 2020-11-03 NOTE — Telephone Encounter (Signed)
Patient called need med refill  benazepril (LOTENSIN) 10 MG tablet   busPIRone (BUSPAR) 7.5 MG tablet  fluticasone (FLONASE) 50 MCG/ACT nasal spray    loratadine (CLARITIN) 10 MG tablet  potassium chloride SA (KLOR-CON) 20 MEQ tablet   simvastatin (ZOCOR) 40 MG tablet  Vitamin D, Ergocalciferol, (DRISDOL) 1.25 MG (50000 UNIT) CAPS capsule    Pharmacy: [facility]

## 2020-11-18 ENCOUNTER — Ambulatory Visit (INDEPENDENT_AMBULATORY_CARE_PROVIDER_SITE_OTHER): Payer: Medicare Other | Admitting: Physician Assistant

## 2020-11-18 ENCOUNTER — Other Ambulatory Visit: Payer: Self-pay

## 2020-11-18 ENCOUNTER — Encounter: Payer: Self-pay | Admitting: Orthopedic Surgery

## 2020-11-18 DIAGNOSIS — Z981 Arthrodesis status: Secondary | ICD-10-CM

## 2020-11-18 NOTE — Progress Notes (Signed)
Office Visit Note   Patient: Jamie Frost           Date of Birth: Jan 03, 1976           MRN: [id number] Visit Date: 11/18/2020              Requested by: Kerri Perches, MD [address] [place],  [place] [address] PCP: Kerri Perches, MD  Chief Complaint  Patient presents with   Right Ankle - Follow-up    06/25/20 right tibial calcaneal fusion       HPI: Patient presents today 5 months status post right tibial calcaneal fusion.  He is accompanied by his father today.  His father states he has had little information about surgery but is very happy that his son is doing well.  We talked about physical therapy he did not do this.  Nor has he gotten his brace.  Despite this he says he is doing good and using a cane to ambulate  Assessment & Plan: Visit Diagnoses: No diagnosis found.  Plan: They will look into a brace.  This may help with his balance.  May follow-up as needed  Follow-Up Instructions: No follow-ups on file.   Ortho Exam  Patient is alert, oriented, no adenopathy, well-dressed, normal affect, normal respiratory effort. Examination of his right ankle demonstrates well-healed surgical incision he does have some dependent edema but that is present in both ankles.  No cellulitis.  Range of motion through the ankle and subtalar joint is rigid.  Compartments are soft and nontender.  No ascending cellulitis.  Imaging: No results found. No images are attached to the encounter.  Labs: Lab Results  Component Value Date   HGBA1C 5.7 (H) 06/23/2020   HGBA1C 5.6 04/07/2020   HGBA1C 5.7 (H) 09/17/2019     Lab Results  Component Value Date   ALBUMIN 4.3 04/07/2020   ALBUMIN 4.4 09/17/2019   ALBUMIN 4.2 07/04/2018    No results found for: MG Lab Results  Component Value Date   VD25OH 33.7 04/07/2020   VD25OH 39.6 09/17/2019   VD25OH 22.4 (L) 07/04/2018    No results found for: PREALBUMIN CBC EXTENDED Latest Ref Rng & Units 06/23/2020  09/17/2019 08/07/2018  WBC 4.0 - 10.5 K/uL 8.4 9.6 9.2  RBC 4.22 - 5.81 MIL/uL 4.84 4.94 4.70  HGB 13.0 - 17.0 g/dL 12.7(L) 13.2 12.5(L)  HCT 39.0 - 52.0 % 43.1 42.6 42.1  PLT 150 - 400 K/uL 245 264 241  NEUTROABS 1.7 - 7.7 K/uL - - -  LYMPHSABS 0.7 - 4.0 K/uL - - -     There is no height or weight on file to calculate BMI.  Orders:  No orders of the defined types were placed in this encounter.  No orders of the defined types were placed in this encounter.    Procedures: No procedures performed  Clinical Data: No additional findings.  ROS:  All other systems negative, except as noted in the HPI. Review of Systems  Objective: Vital Signs: There were no vitals taken for this visit.  Specialty Comments:  No specialty comments available.  PMFS History: Patient Active Problem List   Diagnosis Date Noted   Ankle instability, right    Acquired equinovarus deformity of left foot    Body mass index 40.0-44.9, adult (HCC) 07/16/2018   Spastic diplegic cerebral palsy (HCC) 07/16/2018   Equinus contracture of left ankle 07/16/2018   Cavovarus deformity of foot, acquired, left 07/16/2018  Recurrent falls while walking 07/10/2018   Prediabetes 08/12/2016   Pulmonary hypoplasia 09/02/2010   GENERALIZED ANXIETY DISORDER 10/11/2008   Hyperlipidemia LDL goal <100 09/23/2007   Morbid obesity (HCC) 09/23/2007   Essential hypertension 09/23/2007   ALLERGIC RHINITIS, SEASONAL 09/23/2007   Past Medical History:  Diagnosis Date   Allergic rhinitis, seasonal    Ambulates with cane    straight   Arthritis    Diabetes mellitus without complication (HCC)    type 2 - no meds   Hyperlipidemia    Hypertension    Impaired fasting glucose 12/09/2009   Obesity    Pulmonary hypoplasia    Right leg injury    wears brace LLE - per pt "foot turns over without brace"    Family History  Problem Relation Age of Onset   Obesity Mother    Hypertension Mother    Arthritis Mother     Past  Surgical History:  Procedure Laterality Date   ANKLE FUSION Left 08/09/2018   Procedure: LEFT TIBIO-CALCANEAL FUSION;  Surgeon: Nadara Mustard, MD;  Location: [facility] OR;  Service: Orthopedics;  Laterality: Left;   ANKLE FUSION Right 06/25/2020   Procedure: RIGHT TIBIOCALCANEAL FUSION;  Surgeon: Nadara Mustard, MD;  Location: [facility] OR;  Service: Orthopedics;  Laterality: Right;   bilateral inguinal hernia     I/D abcess on back  2007   1 WEEK    right great toe surgery     Social History   Occupational History   Not on file  Tobacco Use   Smoking status: Never   Smokeless tobacco: Never  Vaping Use   Vaping Use: Never used  Substance and Sexual Activity   Alcohol use: No   Drug use: No   Sexual activity: Not Currently

## 2020-12-23 ENCOUNTER — Ambulatory Visit (INDEPENDENT_AMBULATORY_CARE_PROVIDER_SITE_OTHER): Payer: Medicare Other | Admitting: Family Medicine

## 2020-12-23 ENCOUNTER — Other Ambulatory Visit: Payer: Self-pay

## 2020-12-23 ENCOUNTER — Encounter: Payer: Self-pay | Admitting: Family Medicine

## 2020-12-23 VITALS — BP 129/80 | HR 87 | Resp 16 | Ht 66.0 in | Wt 264.0 lb

## 2020-12-23 DIAGNOSIS — I10 Essential (primary) hypertension: Secondary | ICD-10-CM

## 2020-12-23 DIAGNOSIS — F411 Generalized anxiety disorder: Secondary | ICD-10-CM

## 2020-12-23 DIAGNOSIS — E785 Hyperlipidemia, unspecified: Secondary | ICD-10-CM

## 2020-12-23 DIAGNOSIS — Z23 Encounter for immunization: Secondary | ICD-10-CM

## 2020-12-23 DIAGNOSIS — J301 Allergic rhinitis due to pollen: Secondary | ICD-10-CM | POA: Diagnosis not present

## 2020-12-23 DIAGNOSIS — R7303 Prediabetes: Secondary | ICD-10-CM

## 2020-12-23 NOTE — Patient Instructions (Signed)
F/U in 5 months, call if you ned me before  Flu vaccine and pneumonia 20 vaccine today  Labs already ordered today  Think about what you will eat, plan ahead. Choose " clean, green, fresh or frozen" over canned, processed or packaged foods which are more sugary, salty and fatty. 70 to 75% of food eaten should be vegetables and fruit. Three meals at set times with snacks allowed between meals, but they must be fruit or vegetables. Aim to eat over a 12 hour period , example 7 am to 7 pm, and STOP after  your last meal of the day. Drink water,generally about 64 ounces per day, no other drink is as healthy. Fruit juice is best enjoyed in a healthy way, by EATING the fruit. It is important that you exercise regularly at least 30 minutes 5 times a week. If you develop chest pain, have severe difficulty breathing, or feel very tired, stop exercising immediately and seek medical attention   Thanks for choosing [facility], we consider it a privelige to serve you.

## 2020-12-24 LAB — CMP14+EGFR
ALT: 12 IU/L (ref 0–44)
AST: 15 IU/L (ref 0–40)
Albumin/Globulin Ratio: 1.2 (ref 1.2–2.2)
Albumin: 4.2 g/dL (ref 4.0–5.0)
Alkaline Phosphatase: 106 IU/L (ref 44–121)
BUN/Creatinine Ratio: 17 (ref 9–20)
BUN: 16 mg/dL (ref 6–24)
Bilirubin Total: 0.3 mg/dL (ref 0.0–1.2)
CO2: 24 mmol/L (ref 20–29)
Calcium: 9.6 mg/dL (ref 8.7–10.2)
Chloride: 104 mmol/L (ref 96–106)
Creatinine, Ser: 0.95 mg/dL (ref 0.76–1.27)
Globulin, Total: 3.4 g/dL (ref 1.5–4.5)
Glucose: 80 mg/dL (ref 70–99)
Potassium: 4.2 mmol/L (ref 3.5–5.2)
Sodium: 142 mmol/L (ref 134–144)
Total Protein: 7.6 g/dL (ref 6.0–8.5)
eGFR: 100 mL/min/{1.73_m2} (ref >59)

## 2020-12-24 LAB — LIPID PANEL
Chol/HDL Ratio: 2.3 ratio (ref 0.0–5.0)
Cholesterol, Total: 166 mg/dL (ref 100–199)
HDL: 71 mg/dL (ref >39)
LDL Chol Calc (NIH): 85 mg/dL (ref 0–99)
Triglycerides: 50 mg/dL (ref 0–149)
VLDL Cholesterol Cal: 10 mg/dL (ref 5–40)

## 2020-12-24 LAB — HEMOGLOBIN A1C
Est. average glucose Bld gHb Est-mCnc: 117 mg/dL
Hgb A1c MFr Bld: 5.7 % — ABNORMAL HIGH (ref 4.8–5.6)

## 2020-12-24 LAB — TSH: TSH: 1.2 u[IU]/mL (ref 0.450–4.500)

## 2020-12-26 ENCOUNTER — Encounter: Payer: Self-pay | Admitting: Family Medicine

## 2020-12-26 NOTE — Progress Notes (Signed)
Jamie Frost     MRN: [id number]      DOB: 12/09/1975   HPI Jamie Frost is here for follow up and re-evaluation of chronic medical conditions, medication management and review of any available recent lab and radiology data.  Preventive health is updated, specifically  Cancer screening and Immunization.   Questions or concerns regarding consultations or procedures which the PT has had in the interim are  addressed. The PT denies any adverse reactions to current medications since the last visit.  There are no new concerns.  There are no specific complaints   ROS Denies recent fever or chills. Denies sinus pressure, nasal congestion, ear pain or sore throat. Denies chest congestion, productive cough or wheezing. Denies chest pains, palpitations and leg swelling Denies abdominal pain, nausea, vomiting,diarrhea or constipation.   Denies dysuria, frequency, hesitancy or incontinence. Denies joint pain, swelling and limitation in mobility. Denies headaches, seizures, numbness, or tingling. Denies depression, anxiety or insomnia. Denies skin break down or rash.   PE  BP 129/80   Pulse 87   Resp 16   Ht 5\' 6"  (1.676 m)   Wt 264 lb (119.7 kg)   SpO2 96%   BMI 42.61 kg/m   Patient alert and oriented and in no cardiopulmonary distress.  HEENT: No facial asymmetry, EOMI,     Neck supple .  Chest: Clear to auscultation bilaterally.  CVS: S1, S2 no murmurs, no S3.Regular rate.  ABD: Soft non tender.   Ext: No edema  MS: Decreased ROM spine, shoulders, hips and knees.  Skin: Intact, no ulcerations or rash noted.  Psych: Good eye contact, normal affect. Memory intact not anxious or depressed appearing.  CNS: CN 2-12 intact, power,  normal throughout.no focal deficits noted.   Assessment & Plan Essential hypertension Controlled, no change in medication DASH diet and commitment to daily physical activity for a minimum of 30 minutes discussed and encouraged, as a part  of hypertension management. The importance of attaining a healthy weight is also discussed.  BP/Weight 12/23/2020 09/02/2020 06/25/2020 06/23/2020 04/05/2020 12/15/2019 09/17/2019  Systolic BP 129 135 114 121 - 09/19/2019 111  Diastolic BP 80 79 78 78 - 69 69  Wt. (Lbs) 264 254 260.3 275.9 266.1 276 276  BMI 42.61 41 44.68 47.36 40.46 41.97 41.97       Hyperlipidemia LDL goal <100 Hyperlipidemia:Low fat diet discussed and encouraged.   Lipid Panel  Lab Results  Component Value Date   CHOL 166 12/23/2020   HDL 71 12/23/2020   LDLCALC 85 12/23/2020   TRIG 50 12/23/2020   CHOLHDL 2.3 12/23/2020     Controlled, no change in medication   Morbid obesity (HCC)  Patient re-educated about  the importance of commitment to a  minimum of 150 minutes of exercise per week as able.  The importance of healthy food choices with portion control discussed, as well as eating regularly and within a 12 hour window most days. The need to choose "clean , green" food 50 to 75% of the time is discussed, as well as to make water the primary drink and set a goal of 64 ounces water daily.    Weight /BMI 12/23/2020 09/02/2020 06/25/2020  WEIGHT 264 lb 254 lb 260 lb 4.8 oz  HEIGHT 5\' 6"  5\' 6"  5\' 4"   BMI 42.61 kg/m2 41 kg/m2 44.68 kg/m2      Prediabetes Patient educated about the importance of limiting  Carbohydrate intake , the need to commit to daily physical  activity for a minimum of 30 minutes , and to commit weight loss. The fact that changes in all these areas will reduce or eliminate all together the development of diabetes is stressed.  Unchanged  Diabetic Labs Latest Ref Rng & Units 12/23/2020 06/23/2020 04/07/2020 09/17/2019 01/02/2019  HbA1c 4.8 - 5.6 % 5.7(H) 5.7(H) 5.6 5.7(H) -  Microalbumin Not Estab. ug/mL - - - - -  Micro/Creat Ratio 0.0 - 30.0 mg/g creat - - - - -  Chol 100 - 199 mg/dL 326 - 712(W) 580 998  HDL >39 mg/dL 71 - 63 61 53  Calc LDL 0 - 99 mg/dL 85 - 338(S) 98 64  Triglycerides  0 - 149 mg/dL 50 - 43 65 49  Creatinine 0.76 - 1.27 mg/dL 5.05 3.97 6.73 4.19 -   BP/Weight 12/23/2020 09/02/2020 06/25/2020 06/23/2020 04/05/2020 12/15/2019 09/17/2019  Systolic BP 129 135 114 121 - 379 111  Diastolic BP 80 79 78 78 - 69 69  Wt. (Lbs) 264 254 260.3 275.9 266.1 276 276  BMI 42.61 41 44.68 47.36 40.46 41.97 41.97   Foot/eye exam completion dates 01/18/2016 09/30/2014  Foot Form Completion Done Done      ALLERGIC RHINITIS, SEASONAL Controlled, no change in medication   GENERALIZED ANXIETY DISORDER Controlled, no change in medication

## 2020-12-26 NOTE — Assessment & Plan Note (Signed)
Controlled, no change in medication DASH diet and commitment to daily physical activity for a minimum of 30 minutes discussed and encouraged, as a part of hypertension management. The importance of attaining a healthy weight is also discussed.  BP/Weight 12/23/2020 09/02/2020 06/25/2020 06/23/2020 04/05/2020 12/15/2019 09/17/2019  Systolic BP 129 135 114 121 - 751 111  Diastolic BP 80 79 78 78 - 69 69  Wt. (Lbs) 264 254 260.3 275.9 266.1 276 276  BMI 42.61 41 44.68 47.36 40.46 41.97 41.97

## 2020-12-26 NOTE — Assessment & Plan Note (Signed)
Controlled, no change in medication  

## 2020-12-26 NOTE — Assessment & Plan Note (Signed)
  Patient re-educated about  the importance of commitment to a  minimum of 150 minutes of exercise per week as able.  The importance of healthy food choices with portion control discussed, as well as eating regularly and within a 12 hour window most days. The need to choose "clean , green" food 50 to 75% of the time is discussed, as well as to make water the primary drink and set a goal of 64 ounces water daily.    Weight /BMI 12/23/2020 09/02/2020 06/25/2020  WEIGHT 264 lb 254 lb 260 lb 4.8 oz  HEIGHT 5\' 6"  5\' 6"  5\' 4"   BMI 42.61 kg/m2 41 kg/m2 44.68 kg/m2

## 2020-12-26 NOTE — Assessment & Plan Note (Signed)
Patient educated about the importance of limiting  Carbohydrate intake , the need to commit to daily physical activity for a minimum of 30 minutes , and to commit weight loss. The fact that changes in all these areas will reduce or eliminate all together the development of diabetes is stressed.  Unchanged  Diabetic Labs Latest Ref Rng & Units 12/23/2020 06/23/2020 04/07/2020 09/17/2019 01/02/2019  HbA1c 4.8 - 5.6 % 5.7(H) 5.7(H) 5.6 5.7(H) -  Microalbumin Not Estab. ug/mL - - - - -  Micro/Creat Ratio 0.0 - 30.0 mg/g creat - - - - -  Chol 100 - 199 mg/dL 235 - 573(U) 202 542  HDL >39 mg/dL 71 - 63 61 53  Calc LDL 0 - 99 mg/dL 85 - 706(C) 98 64  Triglycerides 0 - 149 mg/dL 50 - 43 65 49  Creatinine 0.76 - 1.27 mg/dL 3.76 2.83 1.51 7.61 -   BP/Weight 12/23/2020 09/02/2020 06/25/2020 06/23/2020 04/05/2020 12/15/2019 09/17/2019  Systolic BP 129 135 114 121 - 607 111  Diastolic BP 80 79 78 78 - 69 69  Wt. (Lbs) 264 254 260.3 275.9 266.1 276 276  BMI 42.61 41 44.68 47.36 40.46 41.97 41.97   Foot/eye exam completion dates 01/18/2016 09/30/2014  Foot Form Completion Done Done

## 2020-12-26 NOTE — Assessment & Plan Note (Signed)
Hyperlipidemia:Low fat diet discussed and encouraged.   Lipid Panel  Lab Results  Component Value Date   CHOL 166 12/23/2020   HDL 71 12/23/2020   LDLCALC 85 12/23/2020   TRIG 50 12/23/2020   CHOLHDL 2.3 12/23/2020     Controlled, no change in medication

## 2021-01-03 ENCOUNTER — Other Ambulatory Visit: Payer: Self-pay

## 2021-01-03 ENCOUNTER — Ambulatory Visit (INDEPENDENT_AMBULATORY_CARE_PROVIDER_SITE_OTHER): Payer: Medicare Other

## 2021-01-03 DIAGNOSIS — Z0000 Encounter for general adult medical examination without abnormal findings: Secondary | ICD-10-CM | POA: Diagnosis not present

## 2021-01-03 NOTE — Progress Notes (Signed)
Subjective:   Jamie Frost is a 45 y.o. male who presents for Medicare Annual/Subsequent preventive examination.  I connected with  [name] on 01/03/21 by a audio enabled telemedicine application and verified that I am speaking with the correct person using two identifiers.   I discussed the limitations, risks, security and privacy concerns of performing an evaluation and management service by telephone and the availability of in person appointments. I also discussed with the patient that there may be a patient responsible charge related to this service. The patient expressed understanding and verbally consented to this telephonic visit.   Review of Systems           Objective:    There were no vitals filed for this visit. There is no height or weight on file to calculate BMI.  Advanced Directives 06/23/2020 12/15/2019 08/07/2018 08/20/2017  Does Patient Have a Medical Advance Directive? No No No Yes  Type of Advance Directive - - - (No Data)  Does patient want to make changes to medical advance directive? - - - No - Patient declined  Would patient like information on creating a medical advance directive? Yes (MAU/Ambulatory/Procedural Areas - Information given) No - Patient declined No - Patient declined -    Current Medications (verified) Outpatient Encounter Medications as of 01/03/2021  Medication Sig   albuterol (PROVENTIL) (2.5 MG/3ML) 0.083% nebulizer solution Take 3 mLs (2.5 mg total) by nebulization every 6 (six) hours as needed. One vial per nebulizer every 6 to 8 hours prn (Patient taking differently: Take 2.5 mg by nebulization every 6 (six) hours as needed for wheezing. One vial per nebulizer every 6 to 8 hours prn)   aspirin EC 325 MG tablet Take 1 tablet (325 mg total) by mouth daily.   benazepril (LOTENSIN) 10 MG tablet TAKE ONE (1) TABLET EACH DAY   busPIRone (BUSPAR) 7.5 MG tablet Take 1 tablet (7.5 mg total) by mouth 2 (two) times daily.   Elite  Nebulizer System MISC by Does not apply route.   fluticasone (FLONASE) 50 MCG/ACT nasal spray Place 2 sprays into both nostrils daily.   loratadine (CLARITIN) 10 MG tablet Take 1 tablet (10 mg total) by mouth daily.   potassium chloride SA (KLOR-CON) 20 MEQ tablet Take 1 tablet (20 mEq total) by mouth daily.   simvastatin (ZOCOR) 40 MG tablet Take 1 tablet (40 mg total) by mouth at bedtime.   UNABLE TO FIND 2XL pullups- use daily as needed   Vitamin D, Ergocalciferol, (DRISDOL) 1.25 MG (50000 UNIT) CAPS capsule Take 1 capsule (50,000 Units total) by mouth every 7 (seven) days. TAKE 1 CAPSULE EVERY WEEK   No facility-administered encounter medications on file as of 01/03/2021.    Allergies (verified) Sulfonamide derivatives   History: Past Medical History:  Diagnosis Date   Allergic rhinitis, seasonal    Ambulates with cane    straight   Arthritis    Diabetes mellitus without complication (HCC)    type 2 - no meds   Hyperlipidemia    Hypertension    Impaired fasting glucose 12/09/2009   Obesity    Pulmonary hypoplasia    Right leg injury    wears brace LLE - per pt "foot turns over without brace"   Past Surgical History:  Procedure Laterality Date   ANKLE FUSION Left 08/09/2018   Procedure: LEFT TIBIO-CALCANEAL FUSION;  Surgeon: Nadara Mustard, MD;  Location: [facility] OR;  Service: Orthopedics;  Laterality: Left;   ANKLE FUSION Right  06/25/2020   Procedure: RIGHT TIBIOCALCANEAL FUSION;  Surgeon: Nadara Mustard, MD;  Location: [facility] OR;  Service: Orthopedics;  Laterality: Right;   bilateral inguinal hernia     I/D abcess on back  2007   1 WEEK    right great toe surgery     Family History  Problem Relation Age of Onset   Obesity Mother    Hypertension Mother    Arthritis Mother    Social History   Socioeconomic History   Marital status: Single    Spouse name: Not on file   Number of children: Not on file   Years of education: Not on file   Highest education level: Not on file   Occupational History   Not on file  Tobacco Use   Smoking status: Never   Smokeless tobacco: Never  Vaping Use   Vaping Use: Never used  Substance and Sexual Activity   Alcohol use: No   Drug use: No   Sexual activity: Not Currently  Other Topics Concern   Not on file  Social History Narrative   He enjoys reading the bible during his free time.    Social Determinants of Health   Financial Resource Strain: Not on file  Food Insecurity: Not on file  Transportation Needs: Not on file  Physical Activity: Not on file  Stress: Not on file  Social Connections: Not on file    Tobacco Counseling Counseling given: Not Answered   Clinical Intake:                 Diabetic?no         Activities of Daily Living In your present state of health, do you have any difficulty performing the following activities: 06/23/2020  Hearing? N  Vision? N  Difficulty concentrating or making decisions? N  Walking or climbing stairs? N  Dressing or bathing? N  Doing errands, shopping? N  Some recent data might be hidden    Patient Care Team: Kerri Perches, MD as PCP - General  Indicate any recent Medical Services you may have received from other than [facility] providers in the past year (date may be approximate).     Assessment:   This is a routine wellness examination for Dravon.  Hearing/Vision screen No results found.  Dietary issues and exercise activities discussed:     Goals Addressed   None   Depression Screen PHQ 2/9 Scores 12/23/2020 09/02/2020 12/15/2019 12/15/2019 09/17/2019 08/22/2018 07/03/2018  PHQ - 2 Score 0 0 0 0 0 0 0  PHQ- 9 Score - - - - - - -    Fall Risk Fall Risk  12/23/2020 12/23/2020 09/02/2020 04/05/2020 12/15/2019  Falls in the past year? 0 0 1 1 1   Number falls in past yr: 0 0 1 0 1  Injury with Fall? 0 0 1 0 0  Risk for fall due to : - - Impaired balance/gait;Impaired mobility - History of fall(s);Impaired balance/gait  Follow up - -  Falls evaluation completed - Falls evaluation completed    FALL RISK PREVENTION PERTAINING TO THE HOME:  Any stairs in or around the home? No  If so, are there any without handrails? No  Home free of loose throw rugs in walkways, pet beds, electrical cords, etc? No  Adequate lighting in your home to reduce risk of falls? Yes   ASSISTIVE DEVICES UTILIZED TO PREVENT FALLS:  Life alert? No  Use of a cane, walker or w/c? No  Grab bars in  the bathroom? Yes  Shower chair or bench in shower? No  Elevated toilet seat or a handicapped toilet? No   TIMED UP AND GO:  Was the test performed? No .  Length of time to ambulate 10 feet: n/a sec.     Cognitive Function: MMSE - Mini Mental State Exam 08/20/2017  Orientation to time 5  Orientation to Place 5  Registration 3  Attention/ Calculation 5  Recall 2  Language- name 2 objects 2  Language- repeat 1  Language- follow 3 step command 3  Language- read & follow direction 1  Write a sentence 1  Copy design 0  Total score 28     6CIT Screen 12/15/2019 08/22/2018 08/20/2017  What Year? 0 points 0 points 0 points  What month? 0 points 0 points 0 points  What time? 0 points 0 points 0 points  Count back from 20 4 points 2 points 4 points  Months in reverse 4 points 4 points 4 points  Repeat phrase 0 points 2 points 4 points  Total Score 8 8 12     Immunizations Immunization History  Administered Date(s) Administered   Influenza Split 12/30/2010, 11/29/2011   Influenza Whole 01/19/2007, 12/24/2008, 11/19/2009   Influenza,inj,Quad PF,6+ Mos 12/16/2012, 12/17/2013, 12/21/2014, 12/20/2015, 12/12/2016, 01/01/2018, 12/30/2018, 04/07/2020, 12/23/2020   Moderna Sars-Covid-2 Vaccination 05/29/2019, 06/26/2019   PNEUMOCOCCAL CONJUGATE-20 12/23/2020   Pneumococcal Polysaccharide-23 07/01/2010, 09/15/2015   Td 12/10/2002   Tdap 12/30/2010    TDAP status: Due, Education has been provided regarding the importance of this vaccine. Advised  may receive this vaccine at local pharmacy or Health Dept. Aware to provide a copy of the vaccination record if obtained from local pharmacy or Health Dept. Verbalized acceptance and understanding.  Flu Vaccine status: Up to date  Pneumococcal vaccine status: Up to date  Covid-19 vaccine status: Completed vaccines  Qualifies for Shingles Vaccine? No   Zostavax completed No   Shingrix Completed?: No.    Education has been provided regarding the importance of this vaccine. Patient has been advised to call insurance company to determine out of pocket expense if they have not yet received this vaccine. Advised may also receive vaccine at local pharmacy or Health Dept. Verbalized acceptance and understanding.  Screening Tests Health Maintenance  Topic Date Due   COVID-19 Vaccine (3 - Booster for Moderna series) 08/21/2019   COLONOSCOPY (Pts 45-23yrs Insurance coverage will need to be confirmed)  Never done   TETANUS/TDAP  12/29/2020   Pneumococcal Vaccine 54-88 Years old  Completed   INFLUENZA VACCINE  Completed   Hepatitis C Screening  Completed   HIV Screening  Completed   HPV VACCINES  Aged Out    Health Maintenance  Health Maintenance Due  Topic Date Due   COVID-19 Vaccine (3 - Booster for Moderna series) 08/21/2019   COLONOSCOPY (Pts 45-27yrs Insurance coverage will need to be confirmed)  Never done   TETANUS/TDAP  12/29/2020    Colorectal cancer screening: Referral to GI placed SCHEDULED WITH [facility] . Pt aware the office will call re: appt.  Lung Cancer Screening: (Low Dose CT Chest recommended if Age 44-80 years, 30 pack-year currently smoking OR have quit w/in 15years.) does not qualify.   Lung Cancer Screening Referral: n/a  Additional Screening:  Hepatitis C Screening: does qualify; Completed 08/20/2017  Vision Screening: Recommended annual ophthalmology exams for early detection of glaucoma and other disorders of the eye. Is the patient up to date with their annual  eye exam?  Yes  Who is the provider or what is the name of the office in which the patient attends annual eye exams? [facility]  If pt is not established with a provider, would they like to be referred to a provider to establish care? No .   Dental Screening: Recommended annual dental exams for proper oral hygiene  Community Resource Referral / Chronic Care Management: CRR required this visit?  No   CCM required this visit?  No      Plan:     I have personally reviewed and noted the following in the patient's chart:   Medical and social history Use of alcohol, tobacco or illicit drugs  Current medications and supplements including opioid prescriptions. Patient is not currently taking opioid prescriptions. Functional ability and status Nutritional status Physical activity Advanced directives List of other physicians Hospitalizations, surgeries, and ER visits in previous 12 months Vitals Screenings to include cognitive, depression, and falls Referrals and appointments  In addition, I have reviewed and discussed with patient certain preventive protocols, quality metrics, and best practice recommendations. A written personalized care plan for preventive services as well as general preventive health recommendations were provided to patient.     Jasper Riling, CMA   01/03/2021   Nurse Notes: this was a tele health visit. The patient was at home and the provider was in the office Rutwik Patel,MD

## 2021-01-03 NOTE — Patient Instructions (Signed)
Jamie Frost , Thank you for taking time to come for your Medicare Wellness Visit. I appreciate your ongoing commitment to your health goals. Please review the following plan we discussed and let me know if I can assist you in the future.   Screening recommendations/referrals: Colonoscopy: Scheduled with [facility] Recommended yearly ophthalmology/optometry visit for glaucoma screening and checkup Recommended yearly dental visit for hygiene and checkup  Vaccinations: Influenza vaccine: up to date Pneumococcal vaccine: up to date Tdap vaccine: due now  Shingles vaccine: not due    Advanced directives: patient declined    Conditions/risks identified: hypertension   Next appointment: 1 year   Preventive Care 40-64 Years, Male Preventive care refers to lifestyle choices and visits with your health care provider that can promote health and wellness. What does preventive care include? A yearly physical exam. This is also called an annual well check. Dental exams once or twice a year. Routine eye exams. Ask your health care provider how often you should have your eyes checked. Personal lifestyle choices, including: Daily care of your teeth and gums. Regular physical activity. Eating a healthy diet. Avoiding tobacco and drug use. Limiting alcohol use. Practicing safe sex. Taking low-dose aspirin every day starting at age 45. What happens during an annual well check? The services and screenings done by your health care provider during your annual well check will depend on your age, overall health, lifestyle risk factors, and family history of disease. Counseling  Your health care provider may ask you questions about your: Alcohol use. Tobacco use. Drug use. Emotional well-being. Home and relationship well-being. Sexual activity. Eating habits. Work and work Astronomer. Screening  You may have the following tests or measurements: Height, weight, and BMI. Blood pressure. Lipid and  cholesterol levels. These may be checked every 5 years, or more frequently if you are over 30 years old. Skin check. Lung cancer screening. You may have this screening every year starting at age 35 if you have a 30-pack-year history of smoking and currently smoke or have quit within the past 15 years. Fecal occult blood test (FOBT) of the stool. You may have this test every year starting at age 28. Flexible sigmoidoscopy or colonoscopy. You may have a sigmoidoscopy every 5 years or a colonoscopy every 10 years starting at age 45. Prostate cancer screening. Recommendations will vary depending on your family history and other risks. Hepatitis C blood test. Hepatitis B blood test. Sexually transmitted disease (STD) testing. Diabetes screening. This is done by checking your blood sugar (glucose) after you have not eaten for a while (fasting). You may have this done every 1-3 years. Discuss your test results, treatment options, and if necessary, the need for more tests with your health care provider. Vaccines  Your health care provider may recommend certain vaccines, such as: Influenza vaccine. This is recommended every year. Tetanus, diphtheria, and acellular pertussis (Tdap, Td) vaccine. You may need a Td booster every 10 years. Zoster vaccine. You may need this after age 31. Pneumococcal 13-valent conjugate (PCV13) vaccine. You may need this if you have certain conditions and have not been vaccinated. Pneumococcal polysaccharide (PPSV23) vaccine. You may need one or two doses if you smoke cigarettes or if you have certain conditions. Talk to your health care provider about which screenings and vaccines you need and how often you need them. This information is not intended to replace advice given to you by your health care provider. Make sure you discuss any questions you have with your health  care provider. Document Released: 03/19/2015 Document Revised: 11/10/2015 Document Reviewed:  12/22/2014 Elsevier Interactive Patient Education  2017 [employer].  Fall Prevention in the Home Falls can cause injuries. They can happen to people of all ages. There are many things you can do to make your home safe and to help prevent falls. What can I do on the outside of my home? Regularly fix the edges of walkways and driveways and fix any cracks. Remove anything that might make you trip as you walk through a door, such as a raised step or threshold. Trim any bushes or trees on the path to your home. Use bright outdoor lighting. Clear any walking paths of anything that might make someone trip, such as rocks or tools. Regularly check to see if handrails are loose or broken. Make sure that both sides of any steps have handrails. Any raised decks and porches should have guardrails on the edges. Have any leaves, snow, or ice cleared regularly. Use sand or salt on walking paths during winter. Clean up any spills in your garage right away. This includes oil or grease spills. What can I do in the bathroom? Use night lights. Install grab bars by the toilet and in the tub and shower. Do not use towel bars as grab bars. Use non-skid mats or decals in the tub or shower. If you need to sit down in the shower, use a plastic, non-slip stool. Keep the floor dry. Clean up any water that spills on the floor as soon as it happens. Remove soap buildup in the tub or shower regularly. Attach bath mats securely with double-sided non-slip rug tape. Do not have throw rugs and other things on the floor that can make you trip. What can I do in the bedroom? Use night lights. Make sure that you have a light by your bed that is easy to reach. Do not use any sheets or blankets that are too big for your bed. They should not hang down onto the floor. Have a firm chair that has side arms. You can use this for support while you get dressed. Do not have throw rugs and other things on the floor that can make you  trip. What can I do in the kitchen? Clean up any spills right away. Avoid walking on wet floors. Keep items that you use a lot in easy-to-reach places. If you need to reach something above you, use a strong step stool that has a grab bar. Keep electrical cords out of the way. Do not use floor polish or wax that makes floors slippery. If you must use wax, use non-skid floor wax. Do not have throw rugs and other things on the floor that can make you trip. What can I do with my stairs? Do not leave any items on the stairs. Make sure that there are handrails on both sides of the stairs and use them. Fix handrails that are broken or loose. Make sure that handrails are as long as the stairways. Check any carpeting to make sure that it is firmly attached to the stairs. Fix any carpet that is loose or worn. Avoid having throw rugs at the top or bottom of the stairs. If you do have throw rugs, attach them to the floor with carpet tape. Make sure that you have a light switch at the top of the stairs and the bottom of the stairs. If you do not have them, ask someone to add them for you. What else can I  do to help prevent falls? Wear shoes that: Do not have high heels. Have rubber bottoms. Are comfortable and fit you well. Are closed at the toe. Do not wear sandals. If you use a stepladder: Make sure that it is fully opened. Do not climb a closed stepladder. Make sure that both sides of the stepladder are locked into place. Ask someone to hold it for you, if possible. Clearly mark and make sure that you can see: Any grab bars or handrails. First and last steps. Where the edge of each step is. Use tools that help you move around (mobility aids) if they are needed. These include: Canes. Walkers. Scooters. Crutches. Turn on the lights when you go into a dark area. Replace any light bulbs as soon as they burn out. Set up your furniture so you have a clear path. Avoid moving your furniture  around. If any of your floors are uneven, fix them. If there are any pets around you, be aware of where they are. Review your medicines with your doctor. Some medicines can make you feel dizzy. This can increase your chance of falling. Ask your doctor what other things that you can do to help prevent falls. This information is not intended to replace advice given to you by your health care provider. Make sure you discuss any questions you have with your health care provider. Document Released: 12/17/2008 Document Revised: 07/29/2015 Document Reviewed: 03/27/2014 Elsevier Interactive Patient Education  2017 [employer].

## 2021-01-18 ENCOUNTER — Other Ambulatory Visit: Payer: Self-pay

## 2021-01-18 ENCOUNTER — Ambulatory Visit (INDEPENDENT_AMBULATORY_CARE_PROVIDER_SITE_OTHER): Payer: Medicare Other | Admitting: Gastroenterology

## 2021-01-18 ENCOUNTER — Other Ambulatory Visit: Payer: Self-pay | Admitting: Family Medicine

## 2021-01-18 ENCOUNTER — Encounter: Payer: Self-pay | Admitting: Gastroenterology

## 2021-01-18 DIAGNOSIS — Z1211 Encounter for screening for malignant neoplasm of colon: Secondary | ICD-10-CM | POA: Insufficient documentation

## 2021-01-18 NOTE — Patient Instructions (Signed)
We are arranging a colonoscopy in the near future!  Further recommendations to follow!  It was a pleasure to see you today. I want to create trusting relationships with patients to provide genuine, compassionate, and quality care. I value your feedback. If you receive a survey regarding your visit,  I greatly appreciate you taking time to fill this out.   Caleigha Zale W. Darnette Lampron, PhD, ANP-BC [facility]   

## 2021-01-18 NOTE — Progress Notes (Signed)
Primary Care Physician:  Kerri Perches, MD Referring Physician: Dr. Lodema Hong  Primary Gastroenterologist:  Dr. Jena Gauss  Chief Complaint  Patient presents with   Colonoscopy    Never had tcs. No fhcrc    HPI:   Jamie Frost is a 45 y.o. male presenting today at the request of Dr. Lodema Hong for initial screening colonoscopy. No family history of colorectal cancer or polyps. He is present with his father today.   Denies any abdominal pain, N/V, overt GI bleeding, changes in bowel habits, constipation, diarrhea, weight loss, loss of appetite, dysphagia. No concerning signs/symptoms.   Past Medical History:  Diagnosis Date   Allergic rhinitis, seasonal    Ambulates with cane    straight   Arthritis    Diabetes mellitus without complication (HCC)    type 2 - no meds   Hyperlipidemia    Hypertension    Impaired fasting glucose 12/09/2009   Obesity    Pulmonary hypoplasia    Right leg injury    wears brace LLE - per pt "foot turns over without brace"    Past Surgical History:  Procedure Laterality Date   ANKLE FUSION Left 08/09/2018   Procedure: LEFT TIBIO-CALCANEAL FUSION;  Surgeon: Nadara Mustard, MD;  Location: [facility] OR;  Service: Orthopedics;  Laterality: Left;   ANKLE FUSION Right 06/25/2020   Procedure: RIGHT TIBIOCALCANEAL FUSION;  Surgeon: Nadara Mustard, MD;  Location: [facility] OR;  Service: Orthopedics;  Laterality: Right;   bilateral inguinal hernia     I/D abcess on back  2007   1 WEEK    right great toe surgery      Current Outpatient Medications  Medication Sig Dispense Refill   albuterol (PROVENTIL) (2.5 MG/3ML) 0.083% nebulizer solution Take 3 mLs (2.5 mg total) by nebulization every 6 (six) hours as needed. One vial per nebulizer every 6 to 8 hours prn (Patient taking differently: Take 2.5 mg by nebulization every 6 (six) hours as needed for wheezing. One vial per nebulizer every 6 to 8 hours prn) 75 mL 4   aspirin EC 325 MG tablet Take 1 tablet (325 mg total)  by mouth daily. 30 tablet 0   benazepril (LOTENSIN) 10 MG tablet TAKE ONE (1) TABLET EACH DAY 90 tablet 1   busPIRone (BUSPAR) 7.5 MG tablet Take 1 tablet (7.5 mg total) by mouth 2 (two) times daily. 180 tablet 1   Elite Nebulizer System MISC by Does not apply route.     fluticasone (FLONASE) 50 MCG/ACT nasal spray Place 2 sprays into both nostrils daily. 48 g 1   loratadine (CLARITIN) 10 MG tablet Take 1 tablet (10 mg total) by mouth daily. 90 tablet 1   potassium chloride SA (KLOR-CON) 20 MEQ tablet Take 1 tablet (20 mEq total) by mouth daily. 90 tablet 1   simvastatin (ZOCOR) 40 MG tablet Take 1 tablet (40 mg total) by mouth at bedtime. 90 tablet 1   UNABLE TO FIND 2XL pullups- use daily as needed 100 each 11   Vitamin D, Ergocalciferol, (DRISDOL) 1.25 MG (50000 UNIT) CAPS capsule Take 1 capsule (50,000 Units total) by mouth every 7 (seven) days. TAKE 1 CAPSULE EVERY WEEK 12 capsule 1   No current facility-administered medications for this visit.    Allergies as of 01/18/2021 - Review Complete 01/18/2021  Allergen Reaction Noted   Sulfonamide derivatives  09/23/2007    Family History  Problem Relation Age of Onset   Obesity Mother    Hypertension Mother  Arthritis Mother     Social History   Socioeconomic History   Marital status: Single    Spouse name: Not on file   Number of children: Not on file   Years of education: Not on file   Highest education level: Not on file  Occupational History   Not on file  Tobacco Use   Smoking status: Never   Smokeless tobacco: Never  Vaping Use   Vaping Use: Never used  Substance and Sexual Activity   Alcohol use: No   Drug use: No   Sexual activity: Not Currently  Other Topics Concern   Not on file  Social History Narrative   He enjoys reading the bible during his free time.    Social Determinants of Health   Financial Resource Strain: Low Risk    Difficulty of Paying Living Expenses: Not hard at all  Food Insecurity: No  Food Insecurity   Worried About Programme researcher, broadcasting/film/video in the Last Year: Never true   Ran Out of Food in the Last Year: Never true  Transportation Needs: No Transportation Needs   Lack of Transportation (Medical): No   Lack of Transportation (Non-Medical): No  Physical Activity: Insufficiently Active   Days of Exercise per Week: 3 days   Minutes of Exercise per Session: 10 min  Stress: No Stress Concern Present   Feeling of Stress : Not at all  Social Connections: Moderately Integrated   Frequency of Communication with Friends and Family: More than three times a week   Frequency of Social Gatherings with Friends and Family: More than three times a week   Attends Religious Services: More than 4 times per year   Active Member of [employer] or Organizations: No   Attends Engineer, structural: More than 4 times per year   Marital Status: Never married  Catering manager Violence: Not At Risk   Fear of Current or Ex-Partner: No   Emotionally Abused: No   Physically Abused: No   Sexually Abused: No    Review of Systems: Gen: Denies any fever, chills, fatigue, weight loss, lack of appetite.  CV: Denies chest pain, heart palpitations, peripheral edema, syncope.  Resp: Denies shortness of breath at rest or with exertion. Denies wheezing or cough.  GI: see HPI GU : Denies urinary burning, urinary frequency, urinary hesitancy MS: Denies joint pain, muscle weakness, cramps, or limitation of movement.  Derm: Denies rash, itching, dry skin Psych: Denies depression, anxiety, memory loss, and confusion Heme: Denies bruising, bleeding, and enlarged lymph nodes.  Physical Exam: BP 124/75   Pulse 79   Temp (!) 97.5 F (36.4 C) (Temporal)   Ht 5\' 6"  (1.676 m)   Wt 273 lb 3.2 oz (123.9 kg)   BMI 44.10 kg/m  General:   Alert and oriented. Pleasant and cooperative. Well-nourished and well-developed.  Head:  Normocephalic and atraumatic. Eyes:  Without icterus, sclera clear and conjunctiva  pink.  Ears:  Normal auditory acuity. Mouth:  mask in place Lungs:  Clear to auscultation bilaterally. No wheezes, rales, or rhonchi. No distress.  Heart:  S1, S2 present without murmurs appreciated.  Abdomen:  +BS, soft, non-tender and non-distended. No HSM noted. No guarding or rebound. No masses appreciated.  Rectal:  Deferred  Msk:  uses cane for ambulation Extremities:  With pedal and ankle edema Neurologic:  Alert and  oriented x4;  grossly normal neurologically. Skin:  Intact without significant lesions or rashes. Psych:  Alert and cooperative. Normal mood and affect.  ASSESSMENT: Jamie Frost is a 45 y.o. male presenting today at the request of Dr. Lodema Hong to arrange routine screening colonoscopy. No family history of colorectal cancer or polyps. His father is present with him today.  He has no concerning lower or upper GI signs/symptoms. Will proceed with screening colonoscopy in the near future. ASA 3 due to BMI.    PLAN:  Proceed with colonoscopy by Dr. Jena Gauss in near future using Propofol: the risks, benefits, and alternatives have been discussed with the patient in detail. The patient states understanding and desires to proceed.  Further recommendations to follow   Gelene Mink, PhD, ANP-BC [facility]

## 2021-01-19 ENCOUNTER — Telehealth: Payer: Self-pay | Admitting: Internal Medicine

## 2021-01-19 ENCOUNTER — Telehealth: Payer: Self-pay | Admitting: *Deleted

## 2021-01-19 MED ORDER — PEG 3350-KCL-NA BICARB-NACL 420 G PO SOLR: ORAL | 0 refills | Status: DC

## 2021-01-19 NOTE — Telephone Encounter (Signed)
Duplicate message. 

## 2021-01-19 NOTE — Telephone Encounter (Signed)
Spoke with [name] (on dpr). Patient scheduled for 12/22 at 10:15am. Aware will mail instructions with pre-op appt. Sent Rx to pharmacy.

## 2021-01-19 NOTE — Telephone Encounter (Signed)
Patient returned call

## 2021-01-19 NOTE — Telephone Encounter (Signed)
Patient called back to schedule

## 2021-01-19 NOTE — Telephone Encounter (Signed)
See prior note

## 2021-01-19 NOTE — Telephone Encounter (Signed)
Pt left message and I returned call and had to [facility]

## 2021-01-19 NOTE — Telephone Encounter (Signed)
LMOVM to call back to schedule TCS with propofol, ASA 3 with Dr. Jena Gauss

## 2021-01-20 ENCOUNTER — Encounter: Payer: Self-pay | Admitting: *Deleted

## 2021-01-24 ENCOUNTER — Other Ambulatory Visit: Payer: Self-pay | Admitting: Family Medicine

## 2021-02-16 ENCOUNTER — Encounter: Payer: Self-pay | Admitting: *Deleted

## 2021-02-16 ENCOUNTER — Telehealth: Payer: Self-pay | Admitting: Internal Medicine

## 2021-02-16 NOTE — Telephone Encounter (Signed)
Spoke with Jamie Frost (on dpr). Pt rescheduled to 1/12. Aware will send new instructions and mail new pre-op appt. Pt was on for 12/22 with Dr. Jena Gauss

## 2021-02-16 NOTE — Telephone Encounter (Signed)
PATIENT NEEDS TO RESCHEDULE PROCEDURE DUE TO DEATH IN THE FAMILY

## 2021-02-21 ENCOUNTER — Encounter (HOSPITAL_COMMUNITY): Payer: Medicare Other

## 2021-03-11 NOTE — Patient Instructions (Signed)
CHRISTHOPER BUSBEE  03/11/2021     @PREFPERIOPPHARMACY @   Your procedure is scheduled on  03/17/2021.   Report to 05/15/2021 at  [phone]  A.M.   Call this number if you have problems the morning of surgery:  [phone]   Remember:  Follow the diet and prep instructions given to you by the office.    DO NOT take any medications for diabetes the morning of your procedure.    Use your nebulizer before you come and bring your rescue inhaler with you.    Take these medicines the morning of surgery with A SIP OF WATER                                  buspar, claritin.     Do not wear jewelry, make-up or nail polish.  Do not wear lotions, powders, or perfumes, or deodorant.  Do not shave 48 hours prior to surgery.  Men may shave face and neck.  Do not bring valuables to the hospital.  [facility] is not responsible for any belongings or valuables.  Contacts, dentures or bridgework may not be worn into surgery.  Leave your suitcase in the car.  After surgery it may be brought to your room.  For patients admitted to the hospital, discharge time will be determined by your treatment team.  Patients discharged the day of surgery will not be allowed to drive home and must have someone with them for 24 hours.    Special instructions:   DO NOT smoke tobacco or vape for 24 hours before your procedure.  Please read over the following fact sheets that you were given. Anesthesia Post-op Instructions and Care and Recovery After Surgery      Colonoscopy, Adult, Care After This sheet gives you information about how to care for yourself after your procedure. Your health care provider may also give you more specific instructions. If you have problems or questions, contact your health care provider. What can I expect after the procedure? After the procedure, it is common to have: A small amount of blood in your stool for 24 hours after the procedure. Some gas. Mild cramping or  bloating of your abdomen. Follow these instructions at home: Eating and drinking  Drink enough fluid to keep your urine pale yellow. Follow instructions from your health care provider about eating or drinking restrictions. Resume your normal diet as instructed by your health care provider. Avoid heavy or fried foods that are hard to digest. Activity Rest as told by your health care provider. Avoid sitting for a long time without moving. Get up to take short walks every 1-2 hours. This is important to improve blood flow and breathing. Ask for help if you feel weak or unsteady. Return to your normal activities as told by your health care provider. Ask your health care provider what activities are safe for you. Managing cramping and bloating  Try walking around when you have cramps or feel bloated. Apply heat to your abdomen as told by your health care provider. Use the heat source that your health care provider recommends, such as a moist heat pack or a heating pad. Place a towel between your skin and the heat source. Leave the heat on for 20-30 minutes. Remove the heat if your skin turns bright red. This is especially important if you are unable to feel pain, heat, or  cold. You may have a greater risk of getting burned. General instructions If you were given a sedative during the procedure, it can affect you for several hours. Do not drive or operate machinery until your health care provider says that it is safe. For the first 24 hours after the procedure: Do not sign important documents. Do not drink alcohol. Do your regular daily activities at a slower pace than normal. Eat soft foods that are easy to digest. Take over-the-counter and prescription medicines only as told by your health care provider. Keep all follow-up visits as told by your health care provider. This is important. Contact a health care provider if: You have blood in your stool 2-3 days after the procedure. Get help  right away if you have: More than a small spotting of blood in your stool. Large blood clots in your stool. Swelling of your abdomen. Nausea or vomiting. A fever. Increasing pain in your abdomen that is not relieved with medicine. Summary After the procedure, it is common to have a small amount of blood in your stool. You may also have mild cramping and bloating of your abdomen. If you were given a sedative during the procedure, it can affect you for several hours. Do not drive or operate machinery until your health care provider says that it is safe. Get help right away if you have a lot of blood in your stool, nausea or vomiting, a fever, or increased pain in your abdomen. This information is not intended to replace advice given to you by your health care provider. Make sure you discuss any questions you have with your health care provider. Document Revised: 12/27/2018 Document Reviewed: 09/16/2018 Elsevier Patient Education  2022 Elsevier Inc. Monitored Anesthesia Care, Care After This sheet gives you information about how to care for yourself after your procedure. Your health care provider may also give you more specific instructions. If you have problems or questions, contact your health care provider. What can I expect after the procedure? After the procedure, it is common to have: Tiredness. Forgetfulness about what happened after the procedure. Impaired judgment for important decisions. Nausea or vomiting. Some difficulty with balance. Follow these instructions at home: For the time period you were told by your health care provider:   Rest as needed. Do not participate in activities where you could fall or become injured. Do not drive or use machinery. Do not drink alcohol. Do not take sleeping pills or medicines that cause drowsiness. Do not make important decisions or sign legal documents. Do not take care of children on your own. Eating and drinking Follow the diet that  is recommended by your health care provider. Drink enough fluid to keep your urine pale yellow. If you vomit: Drink water, juice, or soup when you can drink without vomiting. Make sure you have little or no nausea before eating solid foods. General instructions Have a responsible adult stay with you for the time you are told. It is important to have someone help care for you until you are awake and alert. Take over-the-counter and prescription medicines only as told by your health care provider. If you have sleep apnea, surgery and certain medicines can increase your risk for breathing problems. Follow instructions from your health care provider about wearing your sleep device: Anytime you are sleeping, including during daytime naps. While taking prescription pain medicines, sleeping medicines, or medicines that make you drowsy. Avoid smoking. Keep all follow-up visits as told by your health care provider. This  is important. Contact a health care provider if: You keep feeling nauseous or you keep vomiting. You feel light-headed. You are still sleepy or having trouble with balance after 24 hours. You develop a rash. You have a fever. You have redness or swelling around the IV site. Get help right away if: You have trouble breathing. You have new-onset confusion at home. Summary For several hours after your procedure, you may feel tired. You may also be forgetful and have poor judgment. Have a responsible adult stay with you for the time you are told. It is important to have someone help care for you until you are awake and alert. Rest as told. Do not drive or operate machinery. Do not drink alcohol or take sleeping pills. Get help right away if you have trouble breathing, or if you suddenly become confused. This information is not intended to replace advice given to you by your health care provider. Make sure you discuss any questions you have with your health care provider. Document  Revised: 11/06/2019 Document Reviewed: 01/23/2019 Elsevier Patient Education  2022 [employer].

## 2021-03-15 ENCOUNTER — Telehealth: Payer: Self-pay

## 2021-03-15 ENCOUNTER — Encounter (HOSPITAL_COMMUNITY)
Admission: RE | Admit: 2021-03-15 | Discharge: 2021-03-15 | Disposition: A | Discharge status: Home / Self Care | Payer: Medicare Other | Source: Ambulatory Visit | Attending: Internal Medicine | Admitting: Internal Medicine

## 2021-03-15 VITALS — BP 121/71 | HR 74 | Temp 98.6°F | Resp 18 | Ht 66.0 in | Wt 273.1 lb

## 2021-03-15 DIAGNOSIS — R7303 Prediabetes: Secondary | ICD-10-CM

## 2021-03-15 NOTE — Telephone Encounter (Signed)
Cliffton Asters nurse from [facility] called left voicemail needs copy of blood work patient just had done. If the nurse could fax to [phone] attn:  Pam

## 2021-03-15 NOTE — Telephone Encounter (Signed)
Labs sent

## 2021-03-16 NOTE — Anesthesia Preprocedure Evaluation (Signed)
Anesthesia Evaluation  Patient identified by MRN, date of birth, ID band Patient awake    Reviewed: Allergy & Precautions, H&P , NPO status , Patient's Chart, lab work & pertinent test results, reviewed documented beta blocker date and time   Airway Mallampati: II  TM Distance: >3 FB Neck ROM: full    Dental no notable dental hx.    Pulmonary neg pulmonary ROS,    Pulmonary exam normal breath sounds clear to auscultation       Cardiovascular Exercise Tolerance: Good hypertension, negative cardio ROS   Rhythm:regular Rate:Normal     Neuro/Psych PSYCHIATRIC DISORDERS Anxiety  Neuromuscular disease    GI/Hepatic negative GI ROS, Neg liver ROS,   Endo/Other  diabetes, Type obesity  Renal/GU negative Renal ROS  negative genitourinary   Musculoskeletal   Abdominal   Peds  Hematology negative hematology ROS (+)   Anesthesia Other Findings   Reproductive/Obstetrics negative OB ROS                             Anesthesia Physical Anesthesia Plan  ASA: 3  Anesthesia Plan: General   Post-op Pain Management:    Induction:   PONV Risk Score and Plan: Propofol infusion  Airway Management Planned:   Additional Equipment:   Intra-op Plan:   Post-operative Plan:   Informed Consent: I have reviewed the patients History and Physical, chart, labs and discussed the procedure including the risks, benefits and alternatives for the proposed anesthesia with the patient or authorized representative who has indicated his/her understanding and acceptance.     Dental Advisory Given  Plan Discussed with: CRNA  Anesthesia Plan Comments:         Anesthesia Quick Evaluation

## 2021-03-17 ENCOUNTER — Ambulatory Visit (HOSPITAL_COMMUNITY): Payer: Medicare Other | Admitting: Anesthesiology

## 2021-03-17 ENCOUNTER — Encounter (HOSPITAL_COMMUNITY): Payer: Self-pay | Admitting: Internal Medicine

## 2021-03-17 ENCOUNTER — Encounter (HOSPITAL_COMMUNITY)
Admission: RE | Disposition: A | Discharge status: Home / Self Care | Payer: Self-pay | Source: Home / Self Care | Attending: Internal Medicine

## 2021-03-17 ENCOUNTER — Ambulatory Visit (HOSPITAL_COMMUNITY)
Admission: RE | Admit: 2021-03-17 | Discharge: 2021-03-17 | Disposition: A | Discharge status: Home / Self Care | Payer: Medicare Other | Attending: Internal Medicine | Admitting: Internal Medicine

## 2021-03-17 DIAGNOSIS — Z1211 Encounter for screening for malignant neoplasm of colon: Secondary | ICD-10-CM | POA: Diagnosis not present

## 2021-03-17 DIAGNOSIS — K5730 Diverticulosis of large intestine without perforation or abscess without bleeding: Secondary | ICD-10-CM | POA: Insufficient documentation

## 2021-03-17 DIAGNOSIS — E119 Type 2 diabetes mellitus without complications: Secondary | ICD-10-CM | POA: Diagnosis not present

## 2021-03-17 DIAGNOSIS — G801 Spastic diplegic cerebral palsy: Secondary | ICD-10-CM | POA: Diagnosis not present

## 2021-03-17 DIAGNOSIS — Q438 Other specified congenital malformations of intestine: Secondary | ICD-10-CM | POA: Insufficient documentation

## 2021-03-17 HISTORY — PX: COLONOSCOPY WITH PROPOFOL: SHX5780

## 2021-03-17 LAB — GLUCOSE, CAPILLARY
Glucose-Capillary: 79 mg/dL (ref 70–99)
Glucose-Capillary: 77 mg/dL (ref 70–99)

## 2021-03-17 SURGERY — COLONOSCOPY WITH PROPOFOL
Anesthesia: General

## 2021-03-17 MED ORDER — LACTATED RINGERS IV SOLN: INTRAVENOUS | Status: DC

## 2021-03-17 MED ORDER — PROPOFOL 10 MG/ML IV BOLUS
INTRAVENOUS | Status: DC | PRN
  Administered 2021-03-17: 30 mg via INTRAVENOUS
  Administered 2021-03-17: 20 mg via INTRAVENOUS

## 2021-03-17 MED ORDER — LIDOCAINE HCL (PF) 2 % IJ SOLN
INTRAMUSCULAR | Status: AC
  Filled 2021-03-17: qty 25

## 2021-03-17 MED ORDER — LIDOCAINE HCL 1 % IJ SOLN
INTRAMUSCULAR | Status: DC | PRN
  Administered 2021-03-17: 50 mg via INTRADERMAL

## 2021-03-17 MED ORDER — PROPOFOL 1000 MG/100ML IV EMUL
INTRAVENOUS | Status: AC
  Filled 2021-03-17: qty 100

## 2021-03-17 MED ORDER — PROPOFOL 500 MG/50ML IV EMUL
INTRAVENOUS | Status: DC | PRN
  Administered 2021-03-17: 150 ug/kg/min via INTRAVENOUS

## 2021-03-17 NOTE — Transfer of Care (Signed)
Immediate Anesthesia Transfer of Care Note  Patient: Jamie Frost  Procedure(s) Performed: COLONOSCOPY WITH PROPOFOL  Patient Location: Short Stay  Anesthesia Type:General  Level of Consciousness: awake  Airway & Oxygen Therapy: Patient Spontanous Breathing  Post-op Assessment: Report given to RN  Post vital signs: Reviewed  Last Vitals:  Vitals Value Taken Time  BP    Temp    Pulse    Resp    SpO2      Last Pain:  Vitals:   03/17/21 0736  TempSrc:   PainSc: 0-No pain      Patients Stated Pain Goal: 6 (03/17/21 0656)  Complications: No notable events documented.

## 2021-03-17 NOTE — Op Note (Signed)
[facility] Patient Name: Jamie Frost Procedure Date: 03/17/2021 7:07 AM MRN: [id number] Date of Birth: 10/16/1975 Attending MD: Gennette Pac , MD CSN: [id number] Age: 46 Admit Type: Outpatient Procedure:                Colonoscopy Indications:              Screening for colorectal malignant neoplasm Providers:                Gennette Pac, MD, Angelica Ran, Dyann Ruddle Referring MD:              Medicines:                Propofol per Anesthesia Complications:            No immediate complications. Estimated Blood Loss:     Estimated blood loss: none. Procedure:                Pre-Anesthesia Assessment:                           - Prior to the procedure, a History and Physical                            was performed, and patient medications and                            allergies were reviewed. The patient's tolerance of                            previous anesthesia was also reviewed. The risks                            and benefits of the procedure and the sedation                            options and risks were discussed with the patient.                            All questions were answered, and informed consent                            was obtained. Prior Anticoagulants: The patient has                            taken no previous anticoagulant or antiplatelet                            agents. ASA Grade Assessment: III - A patient with                            severe systemic disease. After reviewing the risks                            and benefits, the patient was deemed in  satisfactory condition to undergo the procedure.                           After obtaining informed consent, the colonoscope                            was passed under direct vision. Throughout the                            procedure, the patient's blood pressure, pulse, and                            oxygen saturations were monitored  continuously. The                            [id number]) scope was introduced through the                            anus and advanced to the the cecum, identified by                            appendiceal orifice and ileocecal valve. The                            colonoscopy was performed without difficulty. The                            patient tolerated the procedure well. The quality                            of the bowel preparation was adequate. Scope In: 7:42:08 AM Scope Out: 7:57:46 AM Scope Withdrawal Time: 0 hours 7 minutes 0 seconds  Total Procedure Duration: 0 hours 15 minutes 38 seconds  Findings:      The perianal and digital rectal examinations were normal. Redundant       colon. External abdominal pressure required to reach the cecum.      Scattered medium-mouthed diverticula were found in the sigmoid colon and       descending colon.      The exam was otherwise without abnormality on direct and retroflexion       views. Impression:               - Diverticulosis in the sigmoid colon and in the                            descending colon. Redundant colon.                           - The examination was otherwise normal on direct                            and retroflexion views.                           - No specimens collected. Moderate Sedation:  Moderate (conscious) sedation was personally administered by an       anesthesia professional. The following parameters were monitored: oxygen       saturation, heart rate, blood pressure, respiratory rate, EKG, adequacy       of pulmonary ventilation, and response to care. Recommendation:           - Patient has a contact number available for                            emergencies. The signs and symptoms of potential                            delayed complications were discussed with the                            patient. Return to normal activities tomorrow.                            Written discharge  instructions were provided to the                            patient.                           - Resume previous diet.                           - Continue present medications.                           - Repeat colonoscopy in 10 years for screening                            purposes.                           - Return to GI office (date not yet determined). Procedure Code(s):        --- Professional ---                           [phone], Colonoscopy, flexible; diagnostic, including                            collection of specimen(s) by brushing or washing,                            when performed (separate procedure) Diagnosis Code(s):        --- Professional ---                           Z12.11, Encounter for screening for malignant                            neoplasm of colon                           K57.30, Diverticulosis of  large intestine without                            perforation or abscess without bleeding CPT copyright 2019 American Medical Association. All rights reserved. The codes documented in this report are preliminary and upon coder review may  be revised to meet current compliance requirements. Gerrit Friendsobert M. Sharmel Ballantine, MD Gennette Pacobert Michael Grizel Vesely, MD 03/17/2021 8:51:43 AM This report has been signed electronically. Number of Addenda: 0

## 2021-03-17 NOTE — Discharge Instructions (Addendum)
°  Colonoscopy Discharge Instructions  Read the instructions outlined below and refer to this sheet in the next few weeks. These discharge instructions provide you with general information on caring for yourself after you leave the hospital. Your doctor may also give you specific instructions. While your treatment has been planned according to the most current medical practices available, unavoidable complications occasionally occur. If you have any problems or questions after discharge, call Dr. Jena Gauss at [phone]. ACTIVITY You may resume your regular activity, but move at a slower pace for the next 24 hours.  Take frequent rest periods for the next 24 hours.  Walking will help get rid of the air and reduce the bloated feeling in your belly (abdomen).  No driving for 24 hours (because of the medicine (anesthesia) used during the test).   Do not sign any important legal documents or operate any machinery for 24 hours (because of the anesthesia used during the test).  NUTRITION Drink plenty of fluids.  You may resume your normal diet as instructed by your doctor.  Begin with a light meal and progress to your normal diet. Heavy or fried foods are harder to digest and may make you feel sick to your stomach (nauseated).  Avoid alcoholic beverages for 24 hours or as instructed.  MEDICATIONS You may resume your normal medications unless your doctor tells you otherwise.  WHAT YOU CAN EXPECT TODAY Some feelings of bloating in the abdomen.  Passage of more gas than usual.  Spotting of blood in your stool or on the toilet paper.  IF YOU HAD POLYPS REMOVED DURING THE COLONOSCOPY: No aspirin products for 7 days or as instructed.  No alcohol for 7 days or as instructed.  Eat a soft diet for the next 24 hours.  FINDING OUT THE RESULTS OF YOUR TEST Not all test results are available during your visit. If your test results are not back during the visit, make an appointment with your caregiver to find out the  results. Do not assume everything is normal if you have not heard from your caregiver or the medical facility. It is important for you to follow up on all of your test results.  SEEK IMMEDIATE MEDICAL ATTENTION IF: You have more than a spotting of blood in your stool.  Your belly is swollen (abdominal distention).  You are nauseated or vomiting.  You have a temperature over 101.  You have abdominal pain or discomfort that is severe or gets worse throughout the day.       No polyps found today  Diverticulosis information provided  It is recommended you return in 10 years for repeat screening colonoscopy     I called patient's father, Greig Right, [phone] findings and recommendations

## 2021-03-17 NOTE — Anesthesia Postprocedure Evaluation (Signed)
Anesthesia Post Note  Patient: Jamie Frost  Procedure(s) Performed: COLONOSCOPY WITH PROPOFOL  Patient location during evaluation: Short Stay Anesthesia Type: General Level of consciousness: awake and alert Pain management: pain level controlled Vital Signs Assessment: post-procedure vital signs reviewed and stable Respiratory status: spontaneous breathing Cardiovascular status: blood pressure returned to baseline and stable Postop Assessment: no apparent nausea or vomiting Anesthetic complications: no   No notable events documented.   Last Vitals:  Vitals:   03/17/21 0656 03/17/21 0700  BP:  112/85  Pulse: 91   Resp: 15   Temp: 37 C     Last Pain:  Vitals:   03/17/21 0736  TempSrc:   PainSc: 0-No pain                 Tiersa Dayley

## 2021-03-17 NOTE — H&P (Signed)
@LOGO @   Primary Care Physician:  Fayrene Helper, MD Primary Gastroenterologist:  Dr. Gala Romney  Pre-Procedure History & Physical: HPI:  MILFRED ZEINER is a 46 y.o. male is here for a screening colonoscopy.  No bowel symptoms.  No prior colonoscopy.  No family history of colon cancer  Past Medical History:  Diagnosis Date   Allergic rhinitis, seasonal    Ambulates with cane    straight   Arthritis    Diabetes mellitus without complication ([place])    type 2 - no meds   Hyperlipidemia    Hypertension    Impaired fasting glucose 12/09/2009   Obesity    Pulmonary hypoplasia    Right leg injury    wears brace LLE - per pt "foot turns over without brace"    Past Surgical History:  Procedure Laterality Date   ANKLE FUSION Left 08/09/2018   Procedure: LEFT TIBIO-CALCANEAL FUSION;  Surgeon: Newt Minion, MD;  Location: [facility];  Service: Orthopedics;  Laterality: Left;   ANKLE FUSION Right 06/25/2020   Procedure: RIGHT TIBIOCALCANEAL FUSION;  Surgeon: Newt Minion, MD;  Location: [facility];  Service: Orthopedics;  Laterality: Right;   bilateral inguinal hernia     I/D abcess on back  2007   1 WEEK    right great toe surgery      Prior to Admission medications   Medication Sig Start Date End Date Taking? Authorizing Provider  albuterol (PROVENTIL) (2.5 MG/3ML) 0.083% nebulizer solution Take 3 mLs (2.5 mg total) by nebulization every 6 (six) hours as needed. One vial per nebulizer every 6 to 8 hours prn Patient taking differently: Take 2.5 mg by nebulization every 6 (six) hours as needed for wheezing. One vial per nebulizer every 6 to 8 hours prn 12/07/14  Yes Fayrene Helper, MD  benazepril (LOTENSIN) 10 MG tablet TAKE ONE (1) TABLET EACH DAY 11/03/20  Yes Fayrene Helper, MD  busPIRone (BUSPAR) 7.5 MG tablet Take 1 tablet (7.5 mg total) by mouth 2 (two) times daily. 11/03/20  Yes Fayrene Helper, MD  Elite Nebulizer System MISC by Does not apply route.   Yes [provider]  fluticasone (FLONASE) 50 MCG/ACT nasal spray Place 2 sprays into both nostrils daily. 11/03/20  Yes Fayrene Helper, MD  loratadine (CLARITIN) 10 MG tablet Take 1 tablet (10 mg total) by mouth daily. 11/03/20  Yes Fayrene Helper, MD  polyethylene glycol-electrolytes (NULYTELY) 420 g solution As directed 01/19/21  Yes Baneen Wieseler, Cristopher Estimable, MD  potassium chloride SA (KLOR-CON) 20 MEQ tablet Take 1 tablet (20 mEq total) by mouth daily. 11/03/20  Yes Fayrene Helper, MD  simvastatin (ZOCOR) 40 MG tablet Take 1 tablet (40 mg total) by mouth at bedtime. 11/03/20  Yes Fayrene Helper, MD  Vitamin D, Ergocalciferol, (DRISDOL) 1.25 MG (50000 UNIT) CAPS capsule Take 1 capsule (50,000 Units total) by mouth every 7 (seven) days. TAKE 1 CAPSULE EVERY WEEK Patient taking differently: Take 50,000 Units by mouth every Wednesday. TAKE 1 CAPSULE EVERY WEEK 11/03/20  Yes Fayrene Helper, MD  aspirin EC 325 MG tablet Take 1 tablet (325 mg total) by mouth daily. Patient not taking: Reported on 03/04/2021 06/25/20   Persons, Bevely Palmer, [place]  UNABLE TO FIND 2XL pullups- use daily as needed 04/06/20   Fayrene Helper, MD    Allergies as of 01/19/2021 - Review Complete 01/18/2021  Allergen Reaction Noted   Sulfonamide derivatives  09/23/2007    Family History  Problem Relation Age of Onset   Obesity Mother    Hypertension Mother    Arthritis Mother    Colon cancer Neg Hx    Colon polyps Neg Hx     Social History   Socioeconomic History   Marital status: Single    Spouse name: Not on file   Number of children: Not on file   Years of education: Not on file   Highest education level: Not on file  Occupational History   Not on file  Tobacco Use   Smoking status: Never   Smokeless tobacco: Never  Vaping Use   Vaping Use: Never used  Substance and Sexual Activity   Alcohol use: No   Drug use: No   Sexual activity: Not Currently  Other Topics Concern   Not on file  Social  History Narrative   He enjoys reading the bible during his free time.    Social Determinants of Health   Financial Resource Strain: Low Risk    Difficulty of Paying Living Expenses: Not hard at all  Food Insecurity: No Food Insecurity   Worried About Charity fundraiser in the Last Year: Never true   [place] in the Last Year: Never true  Transportation Needs: No Transportation Needs   Lack of Transportation (Medical): No   Lack of Transportation (Non-Medical): No  Physical Activity: Insufficiently Active   Days of Exercise per Week: 3 days   Minutes of Exercise per Session: 10 min  Stress: No Stress Concern Present   Feeling of Stress : Not at all  Social Connections: Moderately Integrated   Frequency of Communication with Friends and Family: More than three times a week   Frequency of Social Gatherings with Friends and Family: More than three times a week   Attends Religious Services: More than 4 times per year   Active Member of [employer] or Organizations: No   Attends Music therapist: More than 4 times per year   Marital Status: Never married  Human resources officer Violence: Not At Risk   Fear of Current or Ex-Partner: No   Emotionally Abused: No   Physically Abused: No   Sexually Abused: No    Review of Systems: See HPI, otherwise negative ROS  Physical Exam: BP 112/85    Pulse 91    Temp 98.6 F (37 C) (Oral)    Resp 15    Ht 5\' 6"  (1.676 m)    Wt 124 kg    BMI 44.12 kg/m  General:   Alert,   pleasant and cooperative in NAD Head:  Normocephalic and atraumatic. Eyes:  Sclera clear, no icterus.   Conjunctiva pink. Lungs:  Clear throughout to auscultation.   No wheezes, crackles, or rhonchi. No acute distress. Heart:  Regular rate and rhythm; no murmurs, clicks, rubs,  or gallops. Abdomen:  Soft, nontender and nondistended. No masses, hepatosplenomegaly or hernias noted. Normal bowel sounds, without guarding, and without rebound.   Impression/Plan: SHAW DIRICKSON is now here to undergo a screening colonoscopy.  First-ever average risk screening examination.  Risks, benefits, limitations, imponderables and alternatives regarding colonoscopy have been reviewed with the patient. Questions have been answered. All parties agreeable.     Notice:  This dictation was prepared with Dragon dictation along with smaller phrase technology. Any transcriptional errors that result from this process are unintentional and may not be corrected upon review.

## 2021-03-21 ENCOUNTER — Encounter (HOSPITAL_COMMUNITY): Payer: Self-pay | Admitting: Internal Medicine

## 2021-04-06 ENCOUNTER — Other Ambulatory Visit: Payer: Self-pay | Admitting: Family Medicine

## 2021-05-24 ENCOUNTER — Encounter: Payer: Self-pay | Admitting: Family Medicine

## 2021-05-24 ENCOUNTER — Ambulatory Visit (INDEPENDENT_AMBULATORY_CARE_PROVIDER_SITE_OTHER): Payer: 59 | Admitting: Family Medicine

## 2021-05-24 ENCOUNTER — Other Ambulatory Visit: Payer: Self-pay

## 2021-05-24 VITALS — BP 110/71 | HR 93 | Ht 66.0 in | Wt 268.0 lb

## 2021-05-24 DIAGNOSIS — Z125 Encounter for screening for malignant neoplasm of prostate: Secondary | ICD-10-CM

## 2021-05-24 DIAGNOSIS — R7303 Prediabetes: Secondary | ICD-10-CM

## 2021-05-24 DIAGNOSIS — Z6841 Body Mass Index (BMI) 40.0 and over, adult: Secondary | ICD-10-CM | POA: Diagnosis not present

## 2021-05-24 DIAGNOSIS — R296 Repeated falls: Secondary | ICD-10-CM

## 2021-05-24 DIAGNOSIS — Q336 Congenital hypoplasia and dysplasia of lung: Secondary | ICD-10-CM | POA: Diagnosis not present

## 2021-05-24 DIAGNOSIS — F411 Generalized anxiety disorder: Secondary | ICD-10-CM

## 2021-05-24 DIAGNOSIS — E785 Hyperlipidemia, unspecified: Secondary | ICD-10-CM | POA: Diagnosis not present

## 2021-05-24 DIAGNOSIS — E559 Vitamin D deficiency, unspecified: Secondary | ICD-10-CM

## 2021-05-24 NOTE — Patient Instructions (Addendum)
Annual exam in mid to end June, call if you need me sooner ? ?STOP benazepril and potassium today. ?Stop  once weekly vit D when you finish this and start OTC once daily vit D3, 1000 IU  ? ?Please  get fasting lipid, cmp and eGFr, hBa1C, CBC, PSA 5  and vit D 5 to 7 days before next visit ? ?Please get TdAP at pharmacy ? ?It is important that you exercise regularly at least 30 minutes 5 times a week. If you develop chest pain, have severe difficulty breathing, or feel very tired, stop exercising immediately and seek medical attention  ? ?Think about what you will eat, plan ahead. ?Choose " clean, green, fresh or frozen" over canned, processed or packaged foods which are more sugary, salty and fatty. ?70 to 75% of food eaten should be vegetables and fruit. ?Three meals at set times with snacks allowed between meals, but they must be fruit or vegetables. ?Aim to eat over a 12 hour period , example 7 am to 7 pm, and STOP after  your last meal of the day. ?Drink water,generally about 64 ounces per day, no other drink is as healthy. Fruit juice is best enjoyed in a healthy way, by EATING the fruit. ?Thanks for choosing [facility], we consider it a privelige to serve you. ? ?

## 2021-05-24 NOTE — Assessment & Plan Note (Signed)
Controlled, no change in medication  

## 2021-05-24 NOTE — Assessment & Plan Note (Signed)
Hyperlipidemia:Low fat diet discussed and encouraged. ? ? ?Lipid Panel  ?Lab Results  ?Component Value Date  ? CHOL 166 12/23/2020  ? HDL 71 12/23/2020  ? LDLCALC 85 12/23/2020  ? TRIG 50 12/23/2020  ? CHOLHDL 2.3 12/23/2020  ? ? ? ?Updated lab needed at/ before next visit. ? ?

## 2021-05-24 NOTE — Assessment & Plan Note (Signed)
Patient educated about the importance of limiting  Carbohydrate intake , the need to commit to daily physical activity for a minimum of 30 minutes , and to commit weight loss. ?The fact that changes in all these areas will reduce or eliminate all together the development of diabetes is stressed.  ?Updated lab needed at/ before next visit. ? ? ?Diabetic Labs Latest Ref Rng & Units 12/23/2020 06/23/2020 04/07/2020 09/17/2019 01/02/2019  ?HbA1c 4.8 - 5.6 % 5.7(H) 5.7(H) 5.6 5.7(H) -  ?Microalbumin Not Estab. ug/mL - - - - -  ?Micro/Creat Ratio 0.0 - 30.0 mg/g creat - - - - -  ?Chol 100 - 199 mg/dL 161 - 096(E) 454 098  ?HDL >39 mg/dL 71 - 63 61 53  ?Calc LDL 0 - 99 mg/dL 85 - 119(J) 98 64  ?Triglycerides 0 - 149 mg/dL 50 - 43 65 49  ?Creatinine 0.76 - 1.27 mg/dL 4.78 2.95 6.21 3.08 -  ? ?BP/Weight 05/24/2021 03/17/2021 03/15/2021 01/18/2021 12/23/2020 09/02/2020 06/25/2020  ?Systolic BP 110 113 121 124 129 135 114  ?Diastolic BP 71 74 71 75 80 79 78  ?Wt. (Lbs) 268.04 273.37 273.15 273.2 264 254 260.3  ?BMI 43.26 44.12 44.09 44.1 42.61 41 44.68  ? ?Foot/eye exam completion dates 01/18/2016 09/30/2014  ?Foot Form Completion Done Done  ? ? ? ?

## 2021-05-24 NOTE — Assessment & Plan Note (Signed)
?  Patient re-educated about  the importance of commitment to a  minimum of 150 minutes of exercise per week as able. ? ?The importance of healthy food choices with portion control discussed, as well as eating regularly and within a 12 hour window most days. ?The need to choose "clean , green" food 50 to 75% of the time is discussed, as well as to make water the primary drink and set a goal of 64 ounces water daily. ? ?  ?Weight /BMI 05/24/2021 03/17/2021 03/15/2021  ?WEIGHT 268 lb 0.6 oz 273 lb 5.9 oz 273 lb 2.4 oz  ?HEIGHT 5\' 6"  5\' 6"  5\' 6"   ?BMI 43.26 kg/m2 44.12 kg/m2 44.09 kg/m2  ? ? ? ?

## 2021-05-24 NOTE — Progress Notes (Signed)
? ?[name]     MRN: [id number]      DOB: May 29, 1975 ? ? ?HPI ?Mr. Jamie Frost is here for follow up and re-evaluation of chronic medical conditions, medication management and review of any available recent lab and radiology data.  ?Preventive health is updated, specifically  Cancer screening and Immunization.   ?Questions or concerns regarding consultations or procedures which the PT has had in the interim are  addressed. ?The PT denies any adverse reactions to current medications since the last visit.  ?There are no new concerns.  ?There are no specific complaints  ? ?ROS ?Denies recent fever or chills. ?Denies sinus pressure, nasal congestion, ear pain or sore throat. ?Denies chest congestion, productive cough or wheezing. ?Denies chest pains, palpitations and leg swelling ?Denies abdominal pain, nausea, vomiting,diarrhea or constipation.   ?Denies dysuria, frequency, hesitancy or incontinence. ?Denies joint pain, swelling and limitation in mobility. ?Denies headaches, seizures, numbness, or tingling. ?Denies depression, anxiety or insomnia. ?Denies skin break down or rash. ? ? ?PE ? ?BP 110/71   Pulse 93   Ht 5\' 6"  (1.676 m)   Wt 268 lb 0.6 oz (121.6 kg)   SpO2 95%   BMI 43.26 kg/m?  ? ?Patient alert and oriented and in no cardiopulmonary distress. ? ?HEENT: No facial asymmetry, EOMI,     Neck supple . ? ?Chest: Clear to auscultation bilaterally. ? ?CVS: S1, S2 no murmurs, no S3.Regular rate. ? ?ABD: Soft non tender.  ? ?Ext: No edema ? ?MS: decreased  ROM spine, shoulders, hips and knees. ? ?Skin: Intact, no ulcerations or rash noted. ? ?Psych: Good eye contact, normal affect. Memory intact not anxious or depressed appearing. ? ?CNS: CN 2-12 intact, power,  normal throughout.no focal deficits noted. ? ? ?Assessment & Plan ? ?Hyperlipidemia LDL goal <100 ?Hyperlipidemia:Low fat diet discussed and encouraged. ? ? ?Lipid Panel  ?Lab Results  ?Component Value Date  ? CHOL 166 12/23/2020  ? HDL 71  12/23/2020  ? LDLCALC 85 12/23/2020  ? TRIG 50 12/23/2020  ? CHOLHDL 2.3 12/23/2020  ? ? ? ?Updated lab needed at/ before next visit. ? ? ?Morbid obesity (HCC) ? ?Patient re-educated about  the importance of commitment to a  minimum of 150 minutes of exercise per week as able. ? ?The importance of healthy food choices with portion control discussed, as well as eating regularly and within a 12 hour window most days. ?The need to choose "clean , green" food 50 to 75% of the time is discussed, as well as to make water the primary drink and set a goal of 64 ounces water daily. ? ?  ?Weight /BMI 05/24/2021 03/17/2021 03/15/2021  ?WEIGHT 268 lb 0.6 oz 273 lb 5.9 oz 273 lb 2.4 oz  ?HEIGHT 5\' 6"  5\' 6"  5\' 6"   ?BMI 43.26 kg/m2 44.12 kg/m2 44.09 kg/m2  ? ? ?Improved ? ?Pulmonary hypoplasia ?Stable, albuterol as needed ? ?Recurrent falls while walking ?Ambulates with cane for stability and safety ? ?Body mass index 40.0-44.9, adult (HCC) ? ?Patient re-educated about  the importance of commitment to a  minimum of 150 minutes of exercise per week as able. ? ?The importance of healthy food choices with portion control discussed, as well as eating regularly and within a 12 hour window most days. ?The need to choose "clean , green" food 50 to 75% of the time is discussed, as well as to make water the primary drink and set a goal of 64 ounces water daily. ? ?  ?  Weight /BMI 05/24/2021 03/17/2021 03/15/2021  ?WEIGHT 268 lb 0.6 oz 273 lb 5.9 oz 273 lb 2.4 oz  ?HEIGHT 5\' 6"  5\' 6"  5\' 6"   ?BMI 43.26 kg/m2 44.12 kg/m2 44.09 kg/m2  ? ? ? ? ?GENERALIZED ANXIETY DISORDER ?Controlled, no change in medication ? ? ?Prediabetes ?Patient educated about the importance of limiting  Carbohydrate intake , the need to commit to daily physical activity for a minimum of 30 minutes , and to commit weight loss. ?The fact that changes in all these areas will reduce or eliminate all together the development of diabetes is stressed.  ?Updated lab needed at/ before  next visit. ? ? ?Diabetic Labs Latest Ref Rng & Units 12/23/2020 06/23/2020 04/07/2020 09/17/2019 01/02/2019  ?HbA1c 4.8 - 5.6 % 5.7(H) 5.7(H) 5.6 5.7(H) -  ?Microalbumin Not Estab. ug/mL - - - - -  ?Micro/Creat Ratio 0.0 - 30.0 mg/g creat - - - - -  ?Chol 100 - 199 mg/dL 06/05/2020 - 09/19/2019) 01/04/2019 329  ?HDL >39 mg/dL 71 - 63 61 53  ?Calc LDL 0 - 99 mg/dL 85 - 924(Q) 98 64  ?Triglycerides 0 - 149 mg/dL 50 - 43 65 49  ?Creatinine 0.76 - 1.27 mg/dL 683 419 622(W 9.79 -  ? ?BP/Weight 05/24/2021 03/17/2021 03/15/2021 01/18/2021 12/23/2020 09/02/2020 06/25/2020  ?Systolic BP 110 113 121 124 129 135 114  ?Diastolic BP 71 74 71 75 80 79 78  ?Wt. (Lbs) 268.04 273.37 273.15 273.2 264 254 260.3  ?BMI 43.26 44.12 44.09 44.1 42.61 41 44.68  ? ?Foot/eye exam completion dates 01/18/2016 09/30/2014  ?Foot Form Completion Done Done  ? ? ? ? ? ?

## 2021-05-24 NOTE — Assessment & Plan Note (Signed)
Stable, albuterol as needed ?

## 2021-05-24 NOTE — Assessment & Plan Note (Signed)
?  Patient re-educated about  the importance of commitment to a  minimum of 150 minutes of exercise per week as able. ? ?The importance of healthy food choices with portion control discussed, as well as eating regularly and within a 12 hour window most days. ?The need to choose "clean , green" food 50 to 75% of the time is discussed, as well as to make water the primary drink and set a goal of 64 ounces water daily. ? ?  ?Weight /BMI 05/24/2021 03/17/2021 03/15/2021  ?WEIGHT 268 lb 0.6 oz 273 lb 5.9 oz 273 lb 2.4 oz  ?HEIGHT 5\' 6"  5\' 6"  5\' 6"   ?BMI 43.26 kg/m2 44.12 kg/m2 44.09 kg/m2  ? ? ?Improved ?

## 2021-05-24 NOTE — Assessment & Plan Note (Signed)
Ambulates with cane for stability and safety ?

## 2021-05-30 ENCOUNTER — Telehealth: Payer: Self-pay

## 2021-05-30 ENCOUNTER — Emergency Department (HOSPITAL_COMMUNITY): Payer: 59

## 2021-05-30 ENCOUNTER — Encounter (HOSPITAL_COMMUNITY): Payer: Self-pay | Admitting: Emergency Medicine

## 2021-05-30 ENCOUNTER — Other Ambulatory Visit: Payer: Self-pay

## 2021-05-30 ENCOUNTER — Emergency Department (HOSPITAL_COMMUNITY)
Admission: EM | Admit: 2021-05-30 | Discharge: 2021-05-30 | Disposition: A | Discharge status: Home / Self Care | Payer: 59 | Attending: Emergency Medicine | Admitting: Emergency Medicine

## 2021-05-30 DIAGNOSIS — M25571 Pain in right ankle and joints of right foot: Secondary | ICD-10-CM

## 2021-05-30 DIAGNOSIS — S99911A Unspecified injury of right ankle, initial encounter: Secondary | ICD-10-CM | POA: Diagnosis not present

## 2021-05-30 DIAGNOSIS — X500XXA Overexertion from strenuous movement or load, initial encounter: Secondary | ICD-10-CM | POA: Insufficient documentation

## 2021-05-30 DIAGNOSIS — Y9301 Activity, walking, marching and hiking: Secondary | ICD-10-CM | POA: Insufficient documentation

## 2021-05-30 DIAGNOSIS — Z7982 Long term (current) use of aspirin: Secondary | ICD-10-CM | POA: Insufficient documentation

## 2021-05-30 DIAGNOSIS — R600 Localized edema: Secondary | ICD-10-CM | POA: Diagnosis not present

## 2021-05-30 MED ORDER — NAPROXEN 375 MG PO TABS: 375.0000 mg | ORAL_TABLET | Freq: Two times a day (BID) | ORAL | 0 refills | Status: AC

## 2021-05-30 NOTE — ED Triage Notes (Signed)
Pt reports turning his right ankle over; swelling noted; prior surgery to same  ?

## 2021-05-30 NOTE — Telephone Encounter (Signed)
Can you please reach out to this pt for an appt with Dr. Lajoyce Corners this week?  ? ? ?Dr. Lajoyce Corners called and sw PA at [facility] advised for pt to follow up this week and placed in a fracture boot.  ?

## 2021-05-30 NOTE — ED Provider Notes (Signed)
?[facility] ?Provider Note ? ? ?CSN: [id number] ?Arrival date & time: 05/30/21  1217 ? ?  ? ?History ? ?Chief Complaint  ?Patient presents with  ? Ankle Pain  ? ? ?Jamie Frost is a 46 y.o. male. ? ?HPI ? ?46 year old male with a history of right ankle fusion who presents to the emergency department today for evaluation of a right ankle injury.  His family member at bedside states that he was walking and twisted his ankle.  Since then he has had some soreness to the area.  The patient denies any overt pain.   ? ?Home Medications ?Prior to Admission medications   ?Medication Sig Start Date End Date Taking? Authorizing Provider  ?albuterol (PROVENTIL) (2.5 MG/3ML) 0.083% nebulizer solution Take 3 mLs (2.5 mg total) by nebulization every 6 (six) hours as needed. One vial per nebulizer every 6 to 8 hours prn ?Patient taking differently: Take 2.5 mg by nebulization every 6 (six) hours as needed for wheezing. One vial per nebulizer every 6 to 8 hours prn 12/07/14   Fayrene Helper, MD  ?aspirin EC 325 MG tablet Take 1 tablet (325 mg total) by mouth daily. 06/25/20   Persons, Bevely Palmer, PA  ?busPIRone (BUSPAR) 7.5 MG tablet TAKE ONE (1) TABLET BY MOUTH TWO (2) TIMES DAILY 04/06/21   Fayrene Helper, MD  ?Elite Nebulizer System MISC by Does not apply route.    [provider]  ?fluticasone (FLONASE) 50 MCG/ACT nasal spray Place 2 sprays into both nostrils daily. 11/03/20   Fayrene Helper, MD  ?loratadine (CLARITIN) 10 MG tablet TAKE ONE (1) TABLET BY MOUTH EVERY DAY 04/06/21   Fayrene Helper, MD  ?polyethylene glycol-electrolytes (NULYTELY) 420 g solution As directed 01/19/21   Rourk, Cristopher Estimable, MD  ?simvastatin (ZOCOR) 40 MG tablet TAKE ONE TABLET BY MOUTH AT BEDTIME 04/06/21   Fayrene Helper, MD  ?Karen Kays TO FIND 2XL pullups- use daily as needed 04/06/20   Fayrene Helper, MD  ?   ? ?Allergies    ?Sulfonamide derivatives   ? ?Review of Systems   ?Review of Systems ?See HPI  for pertinent positives or negatives. ? ? ?Physical Exam ?Updated Vital Signs ?BP 129/81 (BP Location: Right Arm)   Pulse 79   Temp 98.1 ?F (36.7 ?C) (Oral)   Resp 17   Ht 5\' 6"  (1.676 m)   Wt 121.6 kg   SpO2 100%   BMI 43.26 kg/m?  ?Physical Exam ?Constitutional:   ?   General: He is not in acute distress. ?   Appearance: He is well-developed.  ?Eyes:  ?   Conjunctiva/sclera: Conjunctivae normal.  ?Cardiovascular:  ?   Rate and Rhythm: Normal rate.  ?Pulmonary:  ?   Effort: Pulmonary effort is normal.  ?Musculoskeletal:  ?   Comments: BLE edema. Prior surgical scars present to the right ankle. There is minimal ttp on exam. Dp pulse is intact and strong. There is some mild warmth to touch. No significant erythema.   ?Skin: ?   General: Skin is warm and dry.  ?Neurological:  ?   Mental Status: He is alert and oriented to person, place, and time.  ? ? ?ED Results / Procedures / Treatments   ?Labs ?(all labs ordered are listed, but only abnormal results are displayed) ?Labs Reviewed - No data to display ? ?EKG ?None ? ?Radiology ?DG Ankle Complete Right ? ?Result Date: 05/30/2021 ?CLINICAL DATA:  Right ankle pain after fall.  Prior  surgery. EXAM: RIGHT ANKLE - COMPLETE 3+ VIEW COMPARISON:  09/23/2020 FINDINGS: Tibiotalar fusion with plate and screw fixator. There are 2 lag screws spanning the subtalar joint, both which are demonstrating increased lucency especially around the threaded portion of the screw fixators indicating loosening or infection. The vertically oriented screw extending through the proximal tibia, talus, and into the neck of the calcaneus also has a proximal threaded portion with surrounding lucency. Posterior subtalar joint spurring posteriorly. There is also notable dorsal spurring in the midfoot. Equivocal widening of the talonavicular joint. Soft tissue density anterior to the fused tibiotalar joint on the lateral projection along with progressive subcutaneous edema overlying the malleoli,  especially laterally. Scalloped medial margin of the lateral malleolus appears chronic. Progressive thick periostitis in the distal tibia. Achilles calcaneal spur noted. IMPRESSION: 1. Increased subcutaneous edema diffusely along the ankle. 2. Progressive lucency along the threaded portions of the subtalar lag screws favoring lucency or infection. The vertical tibia-talus-calcaneal screw also has proximal threading with surrounding progressive lucency and adjacent periostitis. 3. Increased periostitis in the distal tibia, with ossification now extending anterior to the anterior tibiotalar fusion plate. 4. Substantial dorsal midfoot spurring. 5. Mild prominence of the talonavicular joint with reason the possibility of Chopart joint effusion. Electronically Signed   By: Van Clines M.D.   On: 05/30/2021 13:09   ? ?Procedures ?Procedures  ? ? ?Medications Ordered in ED ?Medications - No data to display ? ?ED Course/ Medical Decision Making/ A&P ?  ?                        ?Medical Decision Making ?Amount and/or Complexity of Data Reviewed ?Radiology: ordered. ? ? ? ?46 year old male presenting the emergency department today for evaluation of a right ankle injury that occurred yesterday after twisting and falling.  X-ray reviewed/interpreted and shows 1. Increased subcutaneous edema diffusely along the ankle. 2. Progressive lucency along the threaded portions of the subtalar lag screws favoring lucency or infection. The vertical tibia-talus-calcaneal screw also has proximal threading with surrounding progressive lucency and adjacent periostitis. 3. Increased periostitis in the distal tibia, with ossification now extending anterior to the anterior tibiotalar fusion plate. 4. Substantial dorsal midfoot spurring. 5. Mild prominence of the talonavicular joint with reason the possibility of Chopart joint effusion.  I spoke with Dr. Sharol Given, the patient's prior orthopedic surgeon about his x-ray.  He personally reviewed  the imaging and does not feel that any emergent surgery is required at this time.  He recommends placing the patient in a cam walker for comfort, patient can be weightbearing as tolerated and will need to follow-up in the office next week.  On reassessment communicated findings and plan with patient and his father at bedside.  They voiced understanding and are in agreement.  All questions were answered.  Patient stable for discharge. ? ?Final Clinical Impression(s) / ED Diagnoses ?Final diagnoses:  ?Acute right ankle pain  ? ? ?Rx / DC Orders ?ED Discharge Orders   ? ? None  ? ?  ? ? ?  ?Rodney Booze, PA-C ?05/30/21 1544 ? ?  ?Hayden Rasmussen, MD ?05/31/21 1037 ? ?

## 2021-05-30 NOTE — ED Notes (Signed)
Right ankle elevated on pillow at this time ?

## 2021-05-30 NOTE — Telephone Encounter (Signed)
Jamie Frost at [facility] ED called triage. She needs consult with Dr.Duda, and ask that he call her back in reference to patient's right ankle.  ?[phone] ?

## 2021-05-30 NOTE — Discharge Instructions (Signed)
Your x-ray showed that you may have some loosening of the hardware in your right ankle.  You were placed in a boot and you can bear weight on that foot as tolerated but you will need to follow-up with Dr. Lajoyce Corners next week.  Please call the office and scheduled appointment for next week and let them know that you were seen in the emergency department and need to follow-up in this timeframe.  Please return to the emergency department for any new or worsening symptoms in the meantime. ?

## 2021-06-02 ENCOUNTER — Ambulatory Visit (INDEPENDENT_AMBULATORY_CARE_PROVIDER_SITE_OTHER): Payer: 59 | Admitting: Orthopedic Surgery

## 2021-06-02 ENCOUNTER — Encounter: Payer: Self-pay | Admitting: Orthopedic Surgery

## 2021-06-02 DIAGNOSIS — Z981 Arthrodesis status: Secondary | ICD-10-CM

## 2021-06-02 NOTE — Progress Notes (Signed)
? ?Office Visit Note ?  ?Patient: Jamie Frost           ?Date of Birth: 03-09-1975           ?MRN: [id number] ?Visit Date: 06/02/2021 ?             ?Requested by: Kerri Perches, MD ?[address], [address] ?[place],  [place] [address] ?PCP: Kerri Perches, MD ? ?Chief Complaint  ?Patient presents with  ? Right Ankle - Injury  ? ? ? ? ?HPI: ?Patient is a 46 year old gentleman who is seen for initial evaluation after recent fall injuring the right tibial calcaneal fusion.  Patient states he fell getting into a truck.  Patient has a history of congenital deformities of both lower extremities and is status post bilateral fusions. ? ?Assessment & Plan: ?Visit Diagnoses:  ?1. S/P ankle fusion   ? ? ?Plan: Patient's foot is stable there is no redness no cellulitis no ulcers plan to continue with the fracture boot. ? ?Follow-Up Instructions: Return in about 4 weeks (around 06/30/2021).  ? ?Ortho Exam ? ?Patient is alert, oriented, no adenopathy, well-dressed, normal affect, normal respiratory effort. ?Examination patient does have swelling of the right lower extremity he has a good pulse.  Radiographs show some lucency around the screws consistent with delayed union.  His foot is plantigrade there are no calluses or ulcers.  Patient has no tenderness to touch.  No signs of infection. ? ?Imaging: ?No results found. ?No images are attached to the encounter. ? ?Labs: ?Lab Results  ?Component Value Date  ? HGBA1C 5.7 (H) 12/23/2020  ? HGBA1C 5.7 (H) 06/23/2020  ? HGBA1C 5.6 04/07/2020  ? ? ? ?Lab Results  ?Component Value Date  ? ALBUMIN 4.2 12/23/2020  ? ALBUMIN 4.3 04/07/2020  ? ALBUMIN 4.4 09/17/2019  ? ? ?No results found for: MG ?Lab Results  ?Component Value Date  ? VD25OH 33.7 04/07/2020  ? VD25OH 39.6 09/17/2019  ? VD25OH 22.4 (L) 07/04/2018  ? ? ?No results found for: PREALBUMIN ? ?  Latest Ref Rng & Units 06/23/2020  ? 10:22 AM 09/17/2019  ? 12:10 PM 08/07/2018  ?  2:40 PM  ?CBC EXTENDED  ?WBC 4.0 - 10.5 K/uL  8.4   9.6   9.2    ?RBC 4.22 - 5.81 MIL/uL 4.84   4.94   4.70    ?Hemoglobin 13.0 - 17.0 g/dL 40.3   47.4   25.9    ?HCT 39.0 - 52.0 % 43.1   42.6   42.1    ?Platelets 150 - 400 K/uL 245   264   241    ? ? ? ?There is no height or weight on file to calculate BMI. ? ?Orders:  ?No orders of the defined types were placed in this encounter. ? ?No orders of the defined types were placed in this encounter. ? ? ? Procedures: ?No procedures performed ? ?Clinical Data: ?No additional findings. ? ?ROS: ? ?All other systems negative, except as noted in the HPI. ?Review of Systems ? ?Objective: ?Vital Signs: There were no vitals taken for this visit. ? ?Specialty Comments:  ?No specialty comments available. ? ?PMFS History: ?Patient Active Problem List  ? Diagnosis Date Noted  ? Encounter for screening colonoscopy 01/18/2021  ? Ankle instability, right   ? Acquired equinovarus deformity of left foot   ? Body mass index 40.0-44.9, adult (HCC) 07/16/2018  ? Spastic diplegic cerebral palsy (HCC) 07/16/2018  ? Equinus contracture of  left ankle 07/16/2018  ? Cavovarus deformity of foot, acquired, left 07/16/2018  ? Recurrent falls while walking 07/10/2018  ? Prediabetes 08/12/2016  ? Pulmonary hypoplasia 09/02/2010  ? GENERALIZED ANXIETY DISORDER 10/11/2008  ? Hyperlipidemia LDL goal <100 09/23/2007  ? Morbid obesity (HCC) 09/23/2007  ? ALLERGIC RHINITIS, SEASONAL 09/23/2007  ? ?Past Medical History:  ?Diagnosis Date  ? Allergic rhinitis, seasonal   ? Ambulates with cane   ? straight  ? Arthritis   ? Diabetes mellitus without complication (HCC)   ? type 2 - no meds  ? Hyperlipidemia   ? Hypertension   ? Impaired fasting glucose 12/09/2009  ? Obesity   ? Pulmonary hypoplasia   ? Right leg injury   ? wears brace LLE - per pt "foot turns over without brace"  ?  ?Family History  ?Problem Relation Age of Onset  ? Obesity Mother   ? Hypertension Mother   ? Arthritis Mother   ? Colon cancer Neg Hx   ? Colon polyps Neg Hx   ?  ?Past Surgical  History:  ?Procedure Laterality Date  ? ANKLE FUSION Left 08/09/2018  ? Procedure: LEFT TIBIO-CALCANEAL FUSION;  Surgeon: Nadara Mustard, MD;  Location: [facility] OR;  Service: Orthopedics;  Laterality: Left;  ? ANKLE FUSION Right 06/25/2020  ? Procedure: RIGHT TIBIOCALCANEAL FUSION;  Surgeon: Nadara Mustard, MD;  Location: [facility] OR;  Service: Orthopedics;  Laterality: Right;  ? bilateral inguinal hernia    ? COLONOSCOPY WITH PROPOFOL N/A 03/17/2021  ? Procedure: COLONOSCOPY WITH PROPOFOL;  Surgeon: Corbin Ade, MD;  [facility];  Service: Endoscopy;  Laterality: N/A;  10:15am  ? I/D abcess on back  2007  ? 1 WEEK   ? right great toe surgery    ? ?Social History  ? ?Occupational History  ? Not on file  ?Tobacco Use  ? Smoking status: Never  ? Smokeless tobacco: Never  ?Vaping Use  ? Vaping Use: Never used  ?Substance and Sexual Activity  ? Alcohol use: No  ? Drug use: No  ? Sexual activity: Not Currently  ? ? ? ? ? ?

## 2021-06-07 ENCOUNTER — Telehealth: Payer: Self-pay

## 2021-06-07 NOTE — Telephone Encounter (Signed)
PCS called ready for pick up. Sent a copy to scans. ?

## 2021-06-27 ENCOUNTER — Telehealth: Payer: Self-pay | Admitting: Orthopedic Surgery

## 2021-06-27 NOTE — Telephone Encounter (Signed)
Pam called. She says patient's foot is swollen and red and he can not walk on it. Would like him seen tomorrow. Call back number is [phone] ?

## 2021-06-27 NOTE — Telephone Encounter (Signed)
Pt has an appt sch for tomorrow at 3:30 ?

## 2021-06-28 ENCOUNTER — Encounter: Payer: Self-pay | Admitting: Orthopedic Surgery

## 2021-06-28 ENCOUNTER — Ambulatory Visit (INDEPENDENT_AMBULATORY_CARE_PROVIDER_SITE_OTHER): Payer: 59 | Admitting: Orthopedic Surgery

## 2021-06-28 ENCOUNTER — Ambulatory Visit (INDEPENDENT_AMBULATORY_CARE_PROVIDER_SITE_OTHER): Payer: 59

## 2021-06-28 DIAGNOSIS — Z981 Arthrodesis status: Secondary | ICD-10-CM

## 2021-06-28 NOTE — Progress Notes (Signed)
? ?Office Visit Note ?  ?Patient: Jamie Frost           ?Date of Birth: 1975-12-24           ?MRN: [id number] ?Visit Date: 06/28/2021 ?             ?Requested by: Fayrene Helper, MD ?[address], [address] ?[place],  [place] [address] ?PCP: Fayrene Helper, MD ? ?Chief Complaint  ?Patient presents with  ? Left Ankle - Edema  ? ? ? ? ?HPI: ?Patient is a 46 year old gentleman who is status post previous left tibial calcaneal fusion.  Patient states he has been having some increasing swelling and brawny edema.  Patient denies any injury he states that this began Sunday. ? ?Assessment & Plan: ?Visit Diagnoses:  ?1. S/P ankle fusion   ? ? ?Plan: Patient was given a 2 extra-large compression sock recommended elevation. ? ?Follow-Up Instructions: Return if symptoms worsen or fail to improve.  ? ?Ortho Exam ? ?Patient is alert, oriented, no adenopathy, well-dressed, normal affect, normal respiratory effort. ?Examination patient has a good pulse there is swelling around the ankle with venous stasis changes.  With elevation of the dependent redness resolves.  There is no ulcers no tenderness to palpation.  He does have brawny skin color changes with venous insufficiency.  Radiograph shows no acute fracture. ? ?Imaging: ?XR Ankle Complete Left ? ?Result Date: 06/28/2021 ?Three-view radiographs of the left ankle shows stable internal fixation for the tibial calcaneal fusion.  No hardware failure no lucency.  ?No images are attached to the encounter. ? ?Labs: ?Lab Results  ?Component Value Date  ? HGBA1C 5.7 (H) 12/23/2020  ? HGBA1C 5.7 (H) 06/23/2020  ? HGBA1C 5.6 04/07/2020  ? ? ? ?Lab Results  ?Component Value Date  ? ALBUMIN 4.2 12/23/2020  ? ALBUMIN 4.3 04/07/2020  ? ALBUMIN 4.4 09/17/2019  ? ? ?No results found for: MG ?Lab Results  ?Component Value Date  ? VD25OH 33.7 04/07/2020  ? VD25OH 39.6 09/17/2019  ? VD25OH 22.4 (L) 07/04/2018  ? ? ?No results found for: PREALBUMIN ? ?  Latest Ref Rng & Units 06/23/2020   ? 10:22 AM 09/17/2019  ? 12:10 PM 08/07/2018  ?  2:40 PM  ?CBC EXTENDED  ?WBC 4.0 - 10.5 K/uL 8.4   9.6   9.2    ?RBC 4.22 - 5.81 MIL/uL 4.84   4.94   4.70    ?Hemoglobin 13.0 - 17.0 g/dL 12.7   13.2   12.5    ?HCT 39.0 - 52.0 % 43.1   42.6   42.1    ?Platelets 150 - 400 K/uL 245   264   241    ? ? ? ?There is no height or weight on file to calculate BMI. ? ?Orders:  ?Orders Placed This Encounter  ?Procedures  ? XR Ankle Complete Left  ? ?No orders of the defined types were placed in this encounter. ? ? ? Procedures: ?No procedures performed ? ?Clinical Data: ?No additional findings. ? ?ROS: ? ?All other systems negative, except as noted in the HPI. ?Review of Systems ? ?Objective: ?Vital Signs: There were no vitals taken for this visit. ? ?Specialty Comments:  ?No specialty comments available. ? ?PMFS History: ?Patient Active Problem List  ? Diagnosis Date Noted  ? Encounter for screening colonoscopy 01/18/2021  ? Ankle instability, right   ? Acquired equinovarus deformity of left foot   ? Body mass index 40.0-44.9, adult ([place]) 07/16/2018  ?  Spastic diplegic cerebral palsy ([place]) 07/16/2018  ? Equinus contracture of left ankle 07/16/2018  ? Cavovarus deformity of foot, acquired, left 07/16/2018  ? Recurrent falls while walking 07/10/2018  ? Prediabetes 08/12/2016  ? Pulmonary hypoplasia 09/02/2010  ? GENERALIZED ANXIETY DISORDER 10/11/2008  ? Hyperlipidemia LDL goal <100 09/23/2007  ? Morbid obesity ([place]) 09/23/2007  ? ALLERGIC RHINITIS, SEASONAL 09/23/2007  ? ?Past Medical History:  ?Diagnosis Date  ? Allergic rhinitis, seasonal   ? Ambulates with cane   ? straight  ? Arthritis   ? Diabetes mellitus without complication ([place])   ? type 2 - no meds  ? Hyperlipidemia   ? Hypertension   ? Impaired fasting glucose 12/09/2009  ? Obesity   ? Pulmonary hypoplasia   ? Right leg injury   ? wears brace LLE - per pt "foot turns over without brace"  ?  ?Family History  ?Problem Relation Age of Onset  ? Obesity Mother   ?  Hypertension Mother   ? Arthritis Mother   ? Colon cancer Neg Hx   ? Colon polyps Neg Hx   ?  ?Past Surgical History:  ?Procedure Laterality Date  ? ANKLE FUSION Left 08/09/2018  ? Procedure: LEFT TIBIO-CALCANEAL FUSION;  Surgeon: Newt Minion, MD;  Location: [facility];  Service: Orthopedics;  Laterality: Left;  ? ANKLE FUSION Right 06/25/2020  ? Procedure: RIGHT TIBIOCALCANEAL FUSION;  Surgeon: Newt Minion, MD;  Location: [facility];  Service: Orthopedics;  Laterality: Right;  ? bilateral inguinal hernia    ? COLONOSCOPY WITH PROPOFOL N/A 03/17/2021  ? Procedure: COLONOSCOPY WITH PROPOFOL;  Surgeon: Daneil Dolin, MD;  [facility];  Service: Endoscopy;  Laterality: N/A;  10:15am  ? I/D abcess on back  2007  ? 1 WEEK   ? right great toe surgery    ? ?Social History  ? ?Occupational History  ? Not on file  ?Tobacco Use  ? Smoking status: Never  ? Smokeless tobacco: Never  ?Vaping Use  ? Vaping Use: Never used  ?Substance and Sexual Activity  ? Alcohol use: No  ? Drug use: No  ? Sexual activity: Not Currently  ? ? ? ? ? ?

## 2021-07-04 ENCOUNTER — Ambulatory Visit: Payer: 59 | Admitting: Orthopedic Surgery

## 2021-07-25 ENCOUNTER — Encounter: Payer: Self-pay | Admitting: Orthopedic Surgery

## 2021-07-25 ENCOUNTER — Ambulatory Visit (INDEPENDENT_AMBULATORY_CARE_PROVIDER_SITE_OTHER): Payer: 59 | Admitting: Orthopedic Surgery

## 2021-07-25 DIAGNOSIS — Z981 Arthrodesis status: Secondary | ICD-10-CM

## 2021-07-25 NOTE — Progress Notes (Signed)
Office Visit Note   Patient: Jamie Frost           Date of Birth: 1975/05/13           MRN: [id number] Visit Date: 07/25/2021              Requested by: Kerri Perches, MD [address] [place],  [place] [address] PCP: Kerri Perches, MD  Chief Complaint  Patient presents with   Left Leg - Routine Post Op      HPI: Patient is a 46 year old gentleman who is status post bilateral tibial calcaneal fusions.  Patient is currently wearing compression socks and a fracture boot on the right.  Assessment & Plan: Visit Diagnoses:  1. S/P ankle fusion     Plan: Patient will advance to regular shoewear bilaterally.  Continue with the compression socks.  Nails were trimmed x9.  Follow-Up Instructions: Return in about 4 weeks (around 08/22/2021).   Ortho Exam  Patient is alert, oriented, no adenopathy, well-dressed, normal affect, normal respiratory effort. Examination patient has thickened discolored onychomycotic nails x9 he is unable to safely trim the nails on his own and the nails were trimmed x9 without complications.  Patient has no plantar ulcers on either foot fusion is stable surgical incisions are well-healed previous radiographs showed stable alignment  Imaging: No results found. No images are attached to the encounter.  Labs: Lab Results  Component Value Date   HGBA1C 5.7 (H) 12/23/2020   HGBA1C 5.7 (H) 06/23/2020   HGBA1C 5.6 04/07/2020     Lab Results  Component Value Date   ALBUMIN 4.2 12/23/2020   ALBUMIN 4.3 04/07/2020   ALBUMIN 4.4 09/17/2019    No results found for: MG Lab Results  Component Value Date   VD25OH 33.7 04/07/2020   VD25OH 39.6 09/17/2019   VD25OH 22.4 (L) 07/04/2018    No results found for: PREALBUMIN    Latest Ref Rng & Units 06/23/2020   10:22 AM 09/17/2019   12:10 PM 08/07/2018    2:40 PM  CBC EXTENDED  WBC 4.0 - 10.5 K/uL 8.4   9.6   9.2    RBC 4.22 - 5.81 MIL/uL 4.84   4.94   4.70    Hemoglobin 13.0  - 17.0 g/dL 24.4   01.0   27.2    HCT 39.0 - 52.0 % 43.1   42.6   42.1    Platelets 150 - 400 K/uL 245   264   241       There is no height or weight on file to calculate BMI.  Orders:  No orders of the defined types were placed in this encounter.  No orders of the defined types were placed in this encounter.    Procedures: No procedures performed  Clinical Data: No additional findings.  ROS:  All other systems negative, except as noted in the HPI. Review of Systems  Objective: Vital Signs: There were no vitals taken for this visit.  Specialty Comments:  No specialty comments available.  PMFS History: Patient Active Problem List   Diagnosis Date Noted   Encounter for screening colonoscopy 01/18/2021   Ankle instability, right    Acquired equinovarus deformity of left foot    Body mass index 40.0-44.9, adult (HCC) 07/16/2018   Spastic diplegic cerebral palsy (HCC) 07/16/2018   Equinus contracture of left ankle 07/16/2018   Cavovarus deformity of foot, acquired, left 07/16/2018   Recurrent falls while walking 07/10/2018  Prediabetes 08/12/2016   Pulmonary hypoplasia 09/02/2010   GENERALIZED ANXIETY DISORDER 10/11/2008   Hyperlipidemia LDL goal <100 09/23/2007   Morbid obesity (HCC) 09/23/2007   ALLERGIC RHINITIS, SEASONAL 09/23/2007   Past Medical History:  Diagnosis Date   Allergic rhinitis, seasonal    Ambulates with cane    straight   Arthritis    Diabetes mellitus without complication (HCC)    type 2 - no meds   Hyperlipidemia    Hypertension    Impaired fasting glucose 12/09/2009   Obesity    Pulmonary hypoplasia    Right leg injury    wears brace LLE - per pt "foot turns over without brace"    Family History  Problem Relation Age of Onset   Obesity Mother    Hypertension Mother    Arthritis Mother    Colon cancer Neg Hx    Colon polyps Neg Hx     Past Surgical History:  Procedure Laterality Date   ANKLE FUSION Left 08/09/2018   Procedure:  LEFT TIBIO-CALCANEAL FUSION;  Surgeon: Nadara Mustard, MD;  Location: [facility] OR;  Service: Orthopedics;  Laterality: Left;   ANKLE FUSION Right 06/25/2020   Procedure: RIGHT TIBIOCALCANEAL FUSION;  Surgeon: Nadara Mustard, MD;  Location: [facility] OR;  Service: Orthopedics;  Laterality: Right;   bilateral inguinal hernia     COLONOSCOPY WITH PROPOFOL N/A 03/17/2021   Procedure: COLONOSCOPY WITH PROPOFOL;  Surgeon: Corbin Ade, MD;  [facility];  Service: Endoscopy;  Laterality: N/A;  10:15am   I/D abcess on back  2007   1 WEEK    right great toe surgery     Social History   Occupational History   Not on file  Tobacco Use   Smoking status: Never   Smokeless tobacco: Never  Vaping Use   Vaping Use: Never used  Substance and Sexual Activity   Alcohol use: No   Drug use: No   Sexual activity: Not Currently

## 2021-08-04 ENCOUNTER — Telehealth: Payer: Self-pay | Admitting: Orthopedic Surgery

## 2021-08-04 NOTE — Telephone Encounter (Signed)
I SW Pam, advised her to have him wear his boot again. He does not have a compression sock for that leg she says. He does not have warmth to that leg. He is scheduled to be seen tomorrow morning.

## 2021-08-04 NOTE — Telephone Encounter (Signed)
Pt's family member Metta Clines states last visit of pt boot was taken off. Pam states every since boot removal pt has swelling and redness. Please call pt about this matter at [phone]

## 2021-08-05 ENCOUNTER — Ambulatory Visit (INDEPENDENT_AMBULATORY_CARE_PROVIDER_SITE_OTHER): Payer: 59 | Admitting: Family

## 2021-08-05 DIAGNOSIS — Z981 Arthrodesis status: Secondary | ICD-10-CM

## 2021-08-05 DIAGNOSIS — R600 Localized edema: Secondary | ICD-10-CM | POA: Diagnosis not present

## 2021-08-05 NOTE — Progress Notes (Signed)
Office Visit Note   Patient: Jamie Frost           Date of Birth: 12/24/1975           MRN: [id number] Visit Date: 08/05/2021              Requested by: Kerri Perches, MD [address], [address] [place],  [place] [address] PCP: Kerri Perches, MD  Chief Complaint  Patient presents with   Right Leg - Pain      HPI: Patient is a 46 year old gentleman who is status post bilateral tibial calcaneal fusions.  Patient is currently wearing compression stocking on the left lower extremity with a regular shoe.  And a fracture boot on the right.  They were seen in the office on May 22.  At that time were advised they could advance to regular shoewear on the right lower extremity since doing so feel the edema in the right lower extremity has worsened.  State do not have any compression garments for the right lower extremity  Assessment & Plan: Visit Diagnoses:  No diagnosis found.   Plan: Patient will advance to regular shoewear bilaterally.  Continue with the compression socks.  Given a compression stocking for the right lower extremity.  We will follow-up as needed.  Follow-Up Instructions: No follow-ups on file.   Ortho Exam  Patient is alert, oriented, no adenopathy, well-dressed, normal affect, normal respiratory effort. Examination patient has thickened discolored onychomycotic nails x9. Patient has no plantar ulcers on either foot fusion is stable surgical incisions are well-healed.  Significant lower extremity edema bilaterally.  Right worse than left.  No weeping no drainage no hemosiderin staining  Imaging: No results found. No images are attached to the encounter.  Labs: Lab Results  Component Value Date   HGBA1C 5.7 (H) 12/23/2020   HGBA1C 5.7 (H) 06/23/2020   HGBA1C 5.6 04/07/2020     Lab Results  Component Value Date   ALBUMIN 4.2 12/23/2020   ALBUMIN 4.3 04/07/2020   ALBUMIN 4.4 09/17/2019    No results found for: MG Lab Results   Component Value Date   VD25OH 33.7 04/07/2020   VD25OH 39.6 09/17/2019   VD25OH 22.4 (L) 07/04/2018    No results found for: PREALBUMIN    Latest Ref Rng & Units 06/23/2020   10:22 AM 09/17/2019   12:10 PM 08/07/2018    2:40 PM  CBC EXTENDED  WBC 4.0 - 10.5 K/uL 8.4   9.6   9.2    RBC 4.22 - 5.81 MIL/uL 4.84   4.94   4.70    Hemoglobin 13.0 - 17.0 g/dL 03.5   00.9   38.1    HCT 39.0 - 52.0 % 43.1   42.6   42.1    Platelets 150 - 400 K/uL 245   264   241       There is no height or weight on file to calculate BMI.  Orders:  No orders of the defined types were placed in this encounter.  No orders of the defined types were placed in this encounter.    Procedures: No procedures performed  Clinical Data: No additional findings.  ROS:  All other systems negative, except as noted in the HPI. Review of Systems  Objective: Vital Signs: There were no vitals taken for this visit.  Specialty Comments:  No specialty comments available.  PMFS History: Patient Active Problem List   Diagnosis Date Noted   Encounter for screening  colonoscopy 01/18/2021   Ankle instability, right    Acquired equinovarus deformity of left foot    Body mass index 40.0-44.9, adult (HCC) 07/16/2018   Spastic diplegic cerebral palsy (HCC) 07/16/2018   Equinus contracture of left ankle 07/16/2018   Cavovarus deformity of foot, acquired, left 07/16/2018   Recurrent falls while walking 07/10/2018   Prediabetes 08/12/2016   Pulmonary hypoplasia 09/02/2010   GENERALIZED ANXIETY DISORDER 10/11/2008   Hyperlipidemia LDL goal <100 09/23/2007   Morbid obesity (HCC) 09/23/2007   ALLERGIC RHINITIS, SEASONAL 09/23/2007   Past Medical History:  Diagnosis Date   Allergic rhinitis, seasonal    Ambulates with cane    straight   Arthritis    Diabetes mellitus without complication (HCC)    type 2 - no meds   Hyperlipidemia    Hypertension    Impaired fasting glucose 12/09/2009   Obesity    Pulmonary  hypoplasia    Right leg injury    wears brace LLE - per pt "foot turns over without brace"    Family History  Problem Relation Age of Onset   Obesity Mother    Hypertension Mother    Arthritis Mother    Colon cancer Neg Hx    Colon polyps Neg Hx     Past Surgical History:  Procedure Laterality Date   ANKLE FUSION Left 08/09/2018   Procedure: LEFT TIBIO-CALCANEAL FUSION;  Surgeon: Nadara Mustard, MD;  Location: [facility] OR;  Service: Orthopedics;  Laterality: Left;   ANKLE FUSION Right 06/25/2020   Procedure: RIGHT TIBIOCALCANEAL FUSION;  Surgeon: Nadara Mustard, MD;  Location: [facility] OR;  Service: Orthopedics;  Laterality: Right;   bilateral inguinal hernia     COLONOSCOPY WITH PROPOFOL N/A 03/17/2021   Procedure: COLONOSCOPY WITH PROPOFOL;  Surgeon: Corbin Ade, MD;  Location: [facility];  Service: Endoscopy;  Laterality: N/A;  10:15am   I/D abcess on back  2007   1 WEEK    right great toe surgery     Social History   Occupational History   Not on file  Tobacco Use   Smoking status: Never   Smokeless tobacco: Never  Vaping Use   Vaping Use: Never used  Substance and Sexual Activity   Alcohol use: No   Drug use: No   Sexual activity: Not Currently

## 2021-08-18 ENCOUNTER — Other Ambulatory Visit: Payer: Self-pay | Admitting: Family Medicine

## 2021-08-24 ENCOUNTER — Encounter: Payer: 59 | Admitting: Family Medicine

## 2021-08-25 ENCOUNTER — Ambulatory Visit: Payer: 59 | Admitting: Orthopedic Surgery

## 2021-08-26 ENCOUNTER — Ambulatory Visit (INDEPENDENT_AMBULATORY_CARE_PROVIDER_SITE_OTHER): Payer: 59 | Admitting: Family Medicine

## 2021-08-26 ENCOUNTER — Telehealth: Payer: Self-pay | Admitting: Family Medicine

## 2021-08-26 ENCOUNTER — Other Ambulatory Visit: Payer: Self-pay | Admitting: Family Medicine

## 2021-08-26 ENCOUNTER — Encounter: Payer: Self-pay | Admitting: Family Medicine

## 2021-08-26 VITALS — BP 122/77 | HR 75 | Ht 67.0 in | Wt 289.1 lb

## 2021-08-26 DIAGNOSIS — Z0001 Encounter for general adult medical examination with abnormal findings: Secondary | ICD-10-CM | POA: Insufficient documentation

## 2021-08-29 ENCOUNTER — Other Ambulatory Visit: Payer: Self-pay | Admitting: Family Medicine

## 2021-08-29 MED ORDER — KETOCONAZOLE 2 % EX SHAM: 1.0000 | MEDICATED_SHAMPOO | CUTANEOUS | 3 refills | Status: DC

## 2021-08-30 ENCOUNTER — Other Ambulatory Visit: Payer: Self-pay | Admitting: Family Medicine

## 2021-08-30 MED ORDER — FUROSEMIDE 20 MG PO TABS: ORAL_TABLET | ORAL | 0 refills | Status: DC

## 2021-08-30 MED ORDER — POTASSIUM CHLORIDE CRYS ER 20 MEQ PO TBCR: EXTENDED_RELEASE_TABLET | ORAL | 0 refills | Status: DC

## 2021-08-31 LAB — CMP14+EGFR
ALT: 22 IU/L (ref 0–44)
AST: 22 IU/L (ref 0–40)
Albumin/Globulin Ratio: 1.4 (ref 1.2–2.2)
Albumin: 4.2 g/dL (ref 4.0–5.0)
Alkaline Phosphatase: 96 IU/L (ref 44–121)
BUN/Creatinine Ratio: 14 (ref 9–20)
BUN: 15 mg/dL (ref 6–24)
Bilirubin Total: 0.4 mg/dL (ref 0.0–1.2)
CO2: 24 mmol/L (ref 20–29)
Calcium: 9.4 mg/dL (ref 8.7–10.2)
Chloride: 100 mmol/L (ref 96–106)
Creatinine, Ser: 1.04 mg/dL (ref 0.76–1.27)
Globulin, Total: 3.1 g/dL (ref 1.5–4.5)
Glucose: 95 mg/dL (ref 70–99)
Potassium: 4.4 mmol/L (ref 3.5–5.2)
Sodium: 139 mmol/L (ref 134–144)
Total Protein: 7.3 g/dL (ref 6.0–8.5)
eGFR: 90 mL/min/{1.73_m2} (ref >59)

## 2021-08-31 LAB — CBC
Hematocrit: 37.7 % (ref 37.5–51.0)
Hemoglobin: 12 g/dL — ABNORMAL LOW (ref 13.0–17.7)
MCH: 26.5 pg — ABNORMAL LOW (ref 26.6–33.0)
MCHC: 31.8 g/dL (ref 31.5–35.7)
MCV: 83 fL (ref 79–97)
Platelets: 264 10*3/uL (ref 150–450)
RBC: 4.53 x10E6/uL (ref 4.14–5.80)
RDW: 13.9 % (ref 11.6–15.4)
WBC: 8.2 10*3/uL (ref 3.4–10.8)

## 2021-08-31 LAB — LIPID PANEL
Chol/HDL Ratio: 2.1 ratio (ref 0.0–5.0)
Cholesterol, Total: 154 mg/dL (ref 100–199)
HDL: 72 mg/dL (ref >39)
LDL Chol Calc (NIH): 74 mg/dL (ref 0–99)
Triglycerides: 35 mg/dL (ref 0–149)
VLDL Cholesterol Cal: 8 mg/dL (ref 5–40)

## 2021-08-31 LAB — PSA: Prostate Specific Ag, Serum: 0.8 ng/mL (ref 0.0–4.0)

## 2021-08-31 LAB — HEMOGLOBIN A1C
Est. average glucose Bld gHb Est-mCnc: 117 mg/dL
Hgb A1c MFr Bld: 5.7 % — ABNORMAL HIGH (ref 4.8–5.6)

## 2021-08-31 LAB — VITAMIN D 25 HYDROXY (VIT D DEFICIENCY, FRACTURES): Vit D, 25-Hydroxy: 36.9 ng/mL (ref 30.0–100.0)

## 2021-08-31 NOTE — Telephone Encounter (Signed)
Pam aware and will return to vascular if pills don't help swelling

## 2021-09-01 LAB — FERRITIN: Ferritin: 31 ng/mL (ref 30–400)

## 2021-09-01 LAB — IRON: Iron: 38 ug/dL (ref 38–169)

## 2021-09-01 LAB — SPECIMEN STATUS REPORT

## 2021-09-09 ENCOUNTER — Telehealth: Payer: Self-pay

## 2021-09-09 NOTE — Telephone Encounter (Signed)
Patient's cousin Elita Quick made aware of lab results.

## 2021-09-09 NOTE — Telephone Encounter (Signed)
Pt's sister called in returning a call about lab results for this pt.  Cb#: [id number]

## 2021-11-13 ENCOUNTER — Other Ambulatory Visit: Payer: Self-pay | Admitting: Family Medicine

## 2021-11-15 ENCOUNTER — Other Ambulatory Visit: Payer: Self-pay

## 2021-11-15 MED ORDER — BUSPIRONE HCL 7.5 MG PO TABS: ORAL_TABLET | ORAL | 1 refills | Status: DC

## 2021-11-15 MED ORDER — SIMVASTATIN 40 MG PO TABS: 40.0000 mg | ORAL_TABLET | Freq: Every day | ORAL | 1 refills | Status: DC

## 2021-11-15 MED ORDER — POTASSIUM CHLORIDE CRYS ER 20 MEQ PO TBCR: EXTENDED_RELEASE_TABLET | ORAL | 2 refills | Status: DC

## 2021-11-15 MED ORDER — FUROSEMIDE 20 MG PO TABS: ORAL_TABLET | ORAL | 2 refills | Status: DC

## 2021-12-15 ENCOUNTER — Other Ambulatory Visit: Payer: Self-pay

## 2021-12-15 MED ORDER — BUSPIRONE HCL 7.5 MG PO TABS: ORAL_TABLET | ORAL | 1 refills | Status: DC

## 2021-12-19 ENCOUNTER — Other Ambulatory Visit: Payer: Self-pay

## 2021-12-19 MED ORDER — SIMVASTATIN 40 MG PO TABS: 40.0000 mg | ORAL_TABLET | Freq: Every day | ORAL | 1 refills | Status: DC

## 2021-12-19 MED ORDER — FUROSEMIDE 20 MG PO TABS: ORAL_TABLET | ORAL | 2 refills | Status: DC

## 2021-12-20 ENCOUNTER — Other Ambulatory Visit: Payer: Self-pay

## 2021-12-20 MED ORDER — POTASSIUM CHLORIDE CRYS ER 20 MEQ PO TBCR: EXTENDED_RELEASE_TABLET | ORAL | 2 refills | Status: DC

## 2021-12-27 ENCOUNTER — Ambulatory Visit (INDEPENDENT_AMBULATORY_CARE_PROVIDER_SITE_OTHER): Payer: 59 | Admitting: Family Medicine

## 2021-12-27 ENCOUNTER — Encounter: Payer: Self-pay | Admitting: Family Medicine

## 2021-12-27 VITALS — BP 121/80 | HR 71 | Ht 67.0 in | Wt 279.1 lb

## 2021-12-27 DIAGNOSIS — E785 Hyperlipidemia, unspecified: Secondary | ICD-10-CM

## 2021-12-27 DIAGNOSIS — F411 Generalized anxiety disorder: Secondary | ICD-10-CM | POA: Diagnosis not present

## 2021-12-27 DIAGNOSIS — Z23 Encounter for immunization: Secondary | ICD-10-CM | POA: Diagnosis not present

## 2021-12-27 DIAGNOSIS — R7303 Prediabetes: Secondary | ICD-10-CM

## 2021-12-27 NOTE — Patient Instructions (Signed)
F/u end Jan call if you need me sooner  Fasting lipid, cmp and eGFr and hBA1C 5 to 7 days before visit  No med changes  Congrats on 10 pound weight loss, keep it up  Flu vaccine today  Please get covid vaccine at pharmacy  Thanks for choosing [facility], we consider it a privelige to serve you.

## 2022-01-02 ENCOUNTER — Encounter: Payer: Self-pay | Admitting: Family Medicine

## 2022-01-02 NOTE — Assessment & Plan Note (Signed)
Improved  Patient re-educated about  the importance of commitment to a  minimum of 150 minutes of exercise per week as able.  The importance of healthy food choices with portion control discussed, as well as eating regularly and within a 12 hour window most days. The need to choose "clean , green" food 50 to 75% of the time is discussed, as well as to make water the primary drink and set a goal of 64 ounces water daily.       12/27/2021   11:10 AM 08/26/2021   11:02 AM 05/30/2021   12:40 PM  Weight /BMI  Weight 279 lb 1.3 oz 289 lb 1.9 oz 268 lb 0.6 oz  Height 5\' 7"  (1.702 m) 5\' 7"  (1.702 m) 5\' 6"  (1.676 m)  BMI 43.71 kg/m2 45.28 kg/m2 43.26 kg/m2

## 2022-01-02 NOTE — Assessment & Plan Note (Signed)
Patient educated about the importance of limiting  Carbohydrate intake , the need to commit to daily physical activity for a minimum of 30 minutes , and to commit weight loss. The fact that changes in all these areas will reduce or eliminate all together the development of diabetes is stressed.      Latest Ref Rng & Units 08/30/2021   10:30 AM 12/23/2020    4:36 PM 06/23/2020   11:00 AM 06/23/2020   10:22 AM 04/07/2020   10:04 AM  Diabetic Labs  HbA1c 4.8 - 5.6 % 5.7  5.7  5.7   5.6   Chol 100 - 199 mg/dL 154  166    200   HDL >39 mg/dL 72  71    63   Calc LDL 0 - 99 mg/dL 74  85    129   Triglycerides 0 - 149 mg/dL 35  50    43   Creatinine 0.76 - 1.27 mg/dL 1.04  0.95   0.98  0.89       12/27/2021   11:10 AM 08/26/2021   11:02 AM 05/30/2021   12:41 PM 05/30/2021   12:40 PM 05/24/2021   11:10 AM 03/17/2021    8:09 AM 03/17/2021    7:00 AM  BP/Weight  Systolic BP 829 562 130  865 784 696  Diastolic BP 80 77 81  71 74 85  Wt. (Lbs) 279.08 289.12  268.04 268.04    BMI 43.71 kg/m2 45.28 kg/m2  43.26 kg/m2 43.26 kg/m2        01/18/2016   10:45 AM 09/30/2014    2:45 PM  Foot/eye exam completion dates  Foot Form Completion Done Done

## 2022-01-02 NOTE — Progress Notes (Signed)
Jamie Frost     MRN: [id number]      DOB: [birth year]   HPI Jamie Frost is here for follow up and re-evaluation of chronic medical conditions, medication management and review of any available recent lab and radiology data.  Preventive health is updated, specifically  Cancer screening and Immunization.   The PT denies any adverse reactions to current medications since the last visit.  There are no new concerns.  There are no specific complaints   ROS Denies recent fever or chills. Denies sinus pressure, nasal congestion, ear pain or sore throat. Denies chest congestion, productive cough or wheezing. Denies chest pains, palpitations and leg swelling Denies abdominal pain, nausea, vomiting,diarrhea or constipation.   Denies dysuria, frequency, hesitancy or incontinence. Chronic  joint pain, swelling and limitation in mobility. Denies headaches, seizures, numbness, or tingling. Denies uncontrolled depression, anxiety or insomnia. Denies skin break down or rash.   PE  BP 121/80 (BP Location: Right Arm, Patient Position: Sitting, Cuff Size: Large)   Pulse 71   Ht 5\' 7"  (1.702 m)   Wt 279 lb 1.3 oz (126.6 kg)   SpO2 96%   BMI 43.71 kg/m   Patient alert and in no cardiopulmonary distress.  HEENT: No facial asymmetry, EOMI,     Neck supple .  Chest: Clear to auscultation bilaterally.  CVS: S1, S2 no murmurs, no S3.Regular rate.  ABD: Soft non tender.   Ext: No edema  MS: Adequate though reduced  ROM spine, shoulders, hips and knees.  Skin: Intact, no ulcerations or rash noted.  Psych: Good eye contact, normal affect. Memory intact not anxious or depressed appearing.  CNS: CN 2-12 intact, power,  normal throughout.no focal deficits noted.   Assessment & Plan  Hyperlipidemia LDL goal <100 Hyperlipidemia:Low fat diet discussed and encouraged.   Lipid Panel  Lab Results  Component Value Date   CHOL 154 08/30/2021   HDL 72 08/30/2021   LDLCALC 74  08/30/2021   TRIG 35 08/30/2021   CHOLHDL 2.1 08/30/2021     Controlled, no change in medication Updated lab needed at/ before next visit.   GENERALIZED ANXIETY DISORDER Controlled, no change in medication   Morbid obesity (HCC) Improved  Patient re-educated about  the importance of commitment to a  minimum of 150 minutes of exercise per week as able.  The importance of healthy food choices with portion control discussed, as well as eating regularly and within a 12 hour window most days. The need to choose "clean , green" food 50 to 75% of the time is discussed, as well as to make water the primary drink and set a goal of 64 ounces water daily.       12/27/2021   11:10 AM 08/26/2021   11:02 AM 05/30/2021   12:40 PM  Weight /BMI  Weight 279 lb 1.3 oz 289 lb 1.9 oz 268 lb 0.6 oz  Height 5\' 7"  (1.702 m) 5\' 7"  (1.702 m) 5\' 6"  (1.676 m)  BMI 43.71 kg/m2 45.28 kg/m2 43.26 kg/m2      Prediabetes Patient educated about the importance of limiting  Carbohydrate intake , the need to commit to daily physical activity for a minimum of 30 minutes , and to commit weight loss. The fact that changes in all these areas will reduce or eliminate all together the development of diabetes is stressed.      Latest Ref Rng & Units 08/30/2021   10:30 AM 12/23/2020    4:36 PM 06/23/2020  11:00 AM 06/23/2020   10:22 AM 04/07/2020   10:04 AM  Diabetic Labs  HbA1c 4.8 - 5.6 % 5.7  5.7  5.7   5.6   Chol 100 - 199 mg/dL 154  166    200   HDL >39 mg/dL 72  71    63   Calc LDL 0 - 99 mg/dL 74  85    129   Triglycerides 0 - 149 mg/dL 35  50    43   Creatinine 0.76 - 1.27 mg/dL 1.04  0.95   0.98  0.89       12/27/2021   11:10 AM 08/26/2021   11:02 AM 05/30/2021   12:41 PM 05/30/2021   12:40 PM 05/24/2021   11:10 AM 03/17/2021    8:09 AM 03/17/2021    7:00 AM  BP/Weight  Systolic BP 809 983 382  505 397 673  Diastolic BP 80 77 81  71 74 85  Wt. (Lbs) 279.08 289.12  268.04 268.04    BMI 43.71 kg/m2  45.28 kg/m2  43.26 kg/m2 43.26 kg/m2        01/18/2016   10:45 AM 09/30/2014    2:45 PM  Foot/eye exam completion dates  Foot Form Completion Done Done

## 2022-01-02 NOTE — Assessment & Plan Note (Signed)
Controlled, no change in medication  

## 2022-01-02 NOTE — Assessment & Plan Note (Signed)
Hyperlipidemia:Low fat diet discussed and encouraged.   Lipid Panel  Lab Results  Component Value Date   CHOL 154 08/30/2021   HDL 72 08/30/2021   LDLCALC 74 08/30/2021   TRIG 35 08/30/2021   CHOLHDL 2.1 08/30/2021     Controlled, no change in medication Updated lab needed at/ before next visit.

## 2022-02-14 ENCOUNTER — Ambulatory Visit (INDEPENDENT_AMBULATORY_CARE_PROVIDER_SITE_OTHER): Payer: 59

## 2022-02-14 DIAGNOSIS — Z0000 Encounter for general adult medical examination without abnormal findings: Secondary | ICD-10-CM | POA: Diagnosis not present

## 2022-02-14 NOTE — Progress Notes (Signed)
Subjective:   Jamie Frost is a 46 y.o. male who presents for Medicare Annual/Subsequent preventive examination.  I connected with  Jamie Frost on 02/14/22 by a audio enabled telemedicine application and verified that I am speaking with the correct person using two identifiers.  Patient Location: Home  Provider Location: Office/Clinic  I discussed the limitations of evaluation and management by telemedicine. The patient expressed understanding and agreed to proceed.   Review of Systems     Jamie Frost , Thank you for taking time to come for your Medicare Wellness Visit. I appreciate your ongoing commitment to your health goals. Please review the following plan we discussed and let me know if I can assist you in the future.   These are the goals we discussed:  Goals      Patient Stated     NONE AT THIS TIME     Weight (lb) < 200 lb (90.7 kg)     Wants to lose 20-30lbs.        This is a list of the screening recommended for you and due dates:  Health Maintenance  Topic Date Due   DTaP/Tdap/Td vaccine (3 - Td or Tdap) 12/29/2020   COVID-19 Vaccine (3 - 2023-24 season) 11/04/2021   Medicare Annual Wellness Visit  02/15/2023   Colon Cancer Screening  03/18/2031   Flu Shot  Completed   Hepatitis C Screening: USPSTF Recommendation to screen - Ages 18-79 yo.  Completed   HIV Screening  Completed   HPV Vaccine  Aged Out          Objective:    There were no vitals filed for this visit. There is no height or weight on file to calculate BMI.     05/30/2021   12:41 PM 03/17/2021    6:53 AM 03/15/2021   10:02 AM 01/03/2021    1:22 PM 06/23/2020    9:59 AM 12/15/2019   10:26 AM 08/07/2018    1:58 PM  Advanced Directives  Does Patient Have a Medical Advance Directive? No No No No No No No  Would patient like information on creating a medical advance directive? No - Patient declined No - Patient declined No - Patient declined No - Patient declined Yes  (MAU/Ambulatory/Procedural Areas - Information given) No - Patient declined No - Patient declined    Current Medications (verified) Outpatient Encounter Medications as of 02/14/2022  Medication Sig   albuterol (PROVENTIL) (2.5 MG/3ML) 0.083% nebulizer solution Take 3 mLs (2.5 mg total) by nebulization every 6 (six) hours as needed. One vial per nebulizer every 6 to 8 hours prn (Patient taking differently: Take 2.5 mg by nebulization every 6 (six) hours as needed for wheezing. One vial per nebulizer every 6 to 8 hours prn)   aspirin EC 325 MG tablet Take 1 tablet (325 mg total) by mouth daily. (Patient not taking: Reported on 08/26/2021)   busPIRone (BUSPAR) 7.5 MG tablet TAKE ONE (1) TABLET BY MOUTH TWO (2) TIMES DAILY   Elite Nebulizer System MISC by Does not apply route.   ferrous sulfate 325 (65 FE) MG tablet Take 325 mg by mouth daily with breakfast.   fluticasone (FLONASE) 50 MCG/ACT nasal spray Place 2 sprays into both nostrils daily.   furosemide (LASIX) 20 MG tablet Take one tablet by mouth three times weekly, as needed, for leg swelling   ketoconazole (NIZORAL) 2 % shampoo Apply 1 Application topically 2 (two) times a week.   loratadine (CLARITIN) 10 MG tablet  TAKE ONE (1) TABLET BY MOUTH EVERY DAY   potassium chloride SA (KLOR-CON M) 20 MEQ tablet Take one tablet by mouth three times weekly, as needed, on days that youtake furosemide for leg swelling   simvastatin (ZOCOR) 40 MG tablet Take 1 tablet (40 mg total) by mouth at bedtime.   UNABLE TO FIND 2XL pullups- use daily as needed   Vitamin D, Ergocalciferol, (DRISDOL) 1.25 MG (50000 UNIT) CAPS capsule TAKE 1 CAPSULE BY MOUTH EVERY 7 DAYS   No facility-administered encounter medications on file as of 02/14/2022.    Allergies (verified) Sulfonamide derivatives   History: Past Medical History:  Diagnosis Date   Allergic rhinitis, seasonal    Ambulates with cane    straight   Arthritis    Diabetes mellitus without complication  ([place])    type 2 - no meds   Hyperlipidemia    Hypertension    Impaired fasting glucose 12/09/2009   Obesity    Pulmonary hypoplasia    Right leg injury    wears brace LLE - per pt "foot turns over without brace"   Past Surgical History:  Procedure Laterality Date   ANKLE FUSION Left 08/09/2018   Procedure: LEFT TIBIO-CALCANEAL FUSION;  Surgeon: Newt Minion, MD;  Location: [place];  Service: Orthopedics;  Laterality: Left;   ANKLE FUSION Right 06/25/2020   Procedure: RIGHT TIBIOCALCANEAL FUSION;  Surgeon: Newt Minion, MD;  Location: [place];  Service: Orthopedics;  Laterality: Right;   bilateral inguinal hernia     COLONOSCOPY WITH PROPOFOL N/A 03/17/2021   Procedure: COLONOSCOPY WITH PROPOFOL;  Surgeon: Daneil Dolin, MD;  [facility];  Service: Endoscopy;  Laterality: N/A;  10:15am   I/D abcess on back  2007   1 WEEK    right great toe surgery     Family History  Problem Relation Age of Onset   Obesity Mother    Hypertension Mother    Arthritis Mother    Colon cancer Neg Hx    Colon polyps Neg Hx    Social History   Socioeconomic History   Marital status: Single    Spouse name: Not on file   Number of children: Not on file   Years of education: Not on file   Highest education level: Not on file  Occupational History   Not on file  Tobacco Use   Smoking status: Never   Smokeless tobacco: Never  Vaping Use   Vaping Use: Never used  Substance and Sexual Activity   Alcohol use: No   Drug use: No   Sexual activity: Not Currently  Other Topics Concern   Not on file  Social History Narrative   He enjoys reading the bible during his free time.    Social Determinants of Health   Financial Resource Strain: Low Risk  (01/03/2021)   Overall Financial Resource Strain (CARDIA)    Difficulty of Paying Living Expenses: Not hard at all  Food Insecurity: No Food Insecurity (01/03/2021)   Hunger Vital Sign    Worried About Running Out of Food in the Last Year:  Never true    Ran Out of Food in the Last Year: Never true  Transportation Needs: No Transportation Needs (01/03/2021)   PRAPARE - Hydrologist (Medical): No    Lack of Transportation (Non-Medical): No  Physical Activity: Insufficiently Active (01/03/2021)   Exercise Vital Sign    Days of Exercise per Week: 3 days  Minutes of Exercise per Session: 10 min  Stress: No Stress Concern Present (01/03/2021)   [employer] of Occupational Health - Occupational Stress Questionnaire    Feeling of Stress : Not at all  Social Connections: Moderately Integrated (01/03/2021)   Social Connection and Isolation Panel [NHANES]    Frequency of Communication with Friends and Family: More than three times a week    Frequency of Social Gatherings with Friends and Family: More than three times a week    Attends Religious Services: More than 4 times per year    Active Member of [employer] or Organizations: No    Attends Engineer, structural: More than 4 times per year    Marital Status: Never married    Tobacco Counseling Counseling given: Not Answered   Clinical Intake:                 Diabetic? No         Activities of Daily Living    03/15/2021   10:01 AM  In your present state of health, do you have any difficulty performing the following activities:  Hearing? 0  Vision? 0  Difficulty concentrating or making decisions? 0  Dressing or bathing? 0    Patient Care Team: Kerri Perches, MD as PCP - General  Indicate any recent Medical Services you may have received from other than [facility] providers in the past year (date may be approximate).     Assessment:   This is a routine wellness examination for Jamie Frost.  Hearing/Vision screen No results found.  Dietary issues and exercise activities discussed:     Goals Addressed   None   Depression Screen    12/27/2021   11:10 AM 08/26/2021   11:03 AM 05/24/2021   11:12 AM  01/03/2021    1:22 PM 01/03/2021    1:19 PM 12/23/2020    3:35 PM 09/02/2020    3:16 PM  PHQ 2/9 Scores  PHQ - 2 Score 0 0 0 0 0 0 0    Fall Risk    12/27/2021   11:10 AM 08/26/2021   11:03 AM 05/24/2021   11:12 AM 01/03/2021    1:22 PM 12/23/2020    3:35 PM  Fall Risk   Falls in the past year? 0 0 0 1 0  Number falls in past yr: 0 0 0 0 0  Injury with Fall? 0 0 0 0 0  Risk for fall due to : No Fall Risks No Fall Risks No Fall Risks History of fall(s)   Follow up Falls evaluation completed Falls evaluation completed Falls evaluation completed Falls evaluation completed     FALL RISK PREVENTION PERTAINING TO THE HOME:  Any stairs in or around the home? Yes  If so, are there any without handrails? No  Home free of loose throw rugs in walkways, pet beds, electrical cords, etc? Yes  Adequate lighting in your home to reduce risk of falls? Yes   ASSISTIVE DEVICES UTILIZED TO PREVENT FALLS:  Life alert? No  Use of a cane, walker or w/c? Yes  Grab bars in the bathroom? Yes  Shower chair or bench in shower? Yes  Elevated toilet seat or a handicapped toilet? Yes     Cognitive Function:    01/03/2021    1:23 PM 08/20/2017   10:43 AM  MMSE - Mini Mental State Exam  Not completed: Unable to complete   Orientation to time  5  Orientation to Place  5  Registration  3  Attention/ Calculation  5  Recall  2  Language- name 2 objects  2  Language- repeat  1  Language- follow 3 step command  3  Language- read & follow direction  1  Write a sentence  1  Copy design  0  Total score  28        01/03/2021    1:23 PM 12/15/2019   10:30 AM 08/22/2018    3:25 PM 08/20/2017   10:29 AM  6CIT Screen  What Year? 0 points 0 points 0 points 0 points  What month? 0 points 0 points 0 points 0 points  What time? 0 points 0 points 0 points 0 points  Count back from 20 4 points 4 points 2 points 4 points  Months in reverse 2 points 4 points 4 points 4 points  Repeat phrase 0 points 0  points 2 points 4 points  Total Score 6 points 8 points 8 points 12 points    Immunizations Immunization History  Administered Date(s) Administered   Influenza Split 12/30/2010, 11/29/2011   Influenza Whole 01/19/2007, 12/24/2008, 11/19/2009   Influenza,inj,Quad PF,6+ Mos 12/16/2012, 12/17/2013, 12/21/2014, 12/20/2015, 12/12/2016, 01/01/2018, 12/30/2018, 04/07/2020, 12/23/2020, 12/27/2021   Moderna Sars-Covid-2 Vaccination 05/29/2019, 06/26/2019   PNEUMOCOCCAL CONJUGATE-20 12/23/2020   Pneumococcal Polysaccharide-23 07/01/2010, 09/15/2015   Td 12/10/2002   Tdap 12/30/2010    TDAP status: Due, Education has been provided regarding the importance of this vaccine. Advised may receive this vaccine at local pharmacy or Health Dept. Aware to provide a copy of the vaccination record if obtained from local pharmacy or Health Dept. Verbalized acceptance and understanding.  Flu Vaccine status: Up to date   Covid-19 vaccine status: Completed vaccines  Qualifies for Shingles Vaccine? No   Zostavax completed  NA   Shingrix Completed?: No.    Education has been provided regarding the importance of this vaccine. Patient has been advised to call insurance company to determine out of pocket expense if they have not yet received this vaccine. Advised may also receive vaccine at local pharmacy or Health Dept. Verbalized acceptance and understanding.  Screening Tests Health Maintenance  Topic Date Due   DTaP/Tdap/Td (3 - Td or Tdap) 12/29/2020   COVID-19 Vaccine (3 - 2023-24 season) 11/04/2021   Medicare Annual Wellness (AWV)  02/15/2023   COLONOSCOPY (Pts 45-106yrs Insurance coverage will need to be confirmed)  03/18/2031   INFLUENZA VACCINE  Completed   Hepatitis C Screening  Completed   HIV Screening  Completed   HPV VACCINES  Aged Out    Health Maintenance  Health Maintenance Due  Topic Date Due   DTaP/Tdap/Td (3 - Td or Tdap) 12/29/2020   COVID-19 Vaccine (3 - 2023-24 season) 11/04/2021     Colorectal cancer screening: Type of screening: Colonoscopy. Completed 03/17/2021. Repeat every 10 years  Lung Cancer Screening: (Low Dose CT Chest recommended if Age 39-80 years, 30 pack-year currently smoking OR have quit w/in 15years.) does not qualify.   Lung Cancer Screening Referral:   Additional Screening:  Hepatitis C Screening: does qualify; Completed   Vision Screening: Recommended annual ophthalmology exams for early detection of glaucoma and other disorders of the eye. Is the patient up to date with their annual eye exam?  Yes  Who is the provider or what is the name of the office in which the patient attends annual eye exams? [facility]   Dental Screening: Recommended annual dental exams for proper oral hygiene  Community Resource Referral /  Chronic Care Management: CRR required this visit?  No   CCM required this visit?  No      Plan:     I have personally reviewed and noted the following in the patient's chart:   Medical and social history Use of alcohol, tobacco or illicit drugs  Current medications and supplements including opioid prescriptions. Patient is not currently taking opioid prescriptions. Functional ability and status Nutritional status Physical activity Advanced directives List of other physicians Hospitalizations, surgeries, and ER visits in previous 12 months Vitals Screenings to include cognitive, depression, and falls Referrals and appointments  In addition, I have reviewed and discussed with patient certain preventive protocols, quality metrics, and best practice recommendations. A written personalized care plan for preventive services as well as general preventive health recommendations were provided to patient.     Johny Drilling, [place]   02/14/2022   Nurse Notes:  Jamie Frost , Thank you for taking time to come for your Medicare Wellness Visit. I appreciate your ongoing commitment to your health goals. Please review the  following plan we discussed and let me know if I can assist you in the future.   These are the goals we discussed:  Goals      Patient Stated     NONE AT THIS TIME     Weight (lb) < 200 lb (90.7 kg)     Wants to lose 20-30lbs.        This is a list of the screening recommended for you and due dates:  Health Maintenance  Topic Date Due   DTaP/Tdap/Td vaccine (3 - Td or Tdap) 12/29/2020   COVID-19 Vaccine (3 - 2023-24 season) 11/04/2021   Medicare Annual Wellness Visit  02/15/2023   Colon Cancer Screening  03/18/2031   Flu Shot  Completed   Hepatitis C Screening: USPSTF Recommendation to screen - Ages 18-79 yo.  Completed   HIV Screening  Completed   HPV Vaccine  Aged Out

## 2022-02-14 NOTE — Patient Instructions (Signed)
Jamie Frost , Thank you for taking time to come for your Medicare Wellness Visit. I appreciate your ongoing commitment to your health goals. Please review the following plan we discussed and let me know if I can assist you in the future.   Screening recommendations/referrals: Colonoscopy: Completed Recommended yearly ophthalmology/optometry visit for glaucoma screening and checkup Recommended yearly dental visit for hygiene and checkup  Vaccinations: Influenza vaccine: Completed Tdap vaccine: Due    Advanced directives: patient declined  Conditions/risks identified: falls  Next appointment: 1 year  Preventive Care 40-64 Years, Male Preventive care refers to lifestyle choices and visits with your health care provider that can promote health and wellness. What does preventive care include? A yearly physical exam. This is also called an annual well check. Dental exams once or twice a year. Routine eye exams. Ask your health care provider how often you should have your eyes checked. Personal lifestyle choices, including: Daily care of your teeth and gums. Regular physical activity. Eating a healthy diet. Avoiding tobacco and drug use. Limiting alcohol use. Practicing safe sex. Taking low-dose aspirin every day starting at age 27. What happens during an annual well check? The services and screenings done by your health care provider during your annual well check will depend on your age, overall health, lifestyle risk factors, and family history of disease. Counseling  Your health care provider may ask you questions about your: Alcohol use. Tobacco use. Drug use. Emotional well-being. Home and relationship well-being. Sexual activity. Eating habits. Work and work Astronomer. Screening  You may have the following tests or measurements: Height, weight, and BMI. Blood pressure. Lipid and cholesterol levels. These may be checked every 5 years, or more frequently if you are  over 51 years old. Skin check. Lung cancer screening. You may have this screening every year starting at age 28 if you have a 30-pack-year history of smoking and currently smoke or have quit within the past 15 years. Fecal occult blood test (FOBT) of the stool. You may have this test every year starting at age 69. Flexible sigmoidoscopy or colonoscopy. You may have a sigmoidoscopy every 5 years or a colonoscopy every 10 years starting at age 19. Prostate cancer screening. Recommendations will vary depending on your family history and other risks. Hepatitis C blood test. Hepatitis B blood test. Sexually transmitted disease (STD) testing. Diabetes screening. This is done by checking your blood sugar (glucose) after you have not eaten for a while (fasting). You may have this done every 1-3 years. Discuss your test results, treatment options, and if necessary, the need for more tests with your health care provider. Vaccines  Your health care provider may recommend certain vaccines, such as: Influenza vaccine. This is recommended every year. Tetanus, diphtheria, and acellular pertussis (Tdap, Td) vaccine. You may need a Td booster every 10 years. Zoster vaccine. You may need this after age 66. Pneumococcal 13-valent conjugate (PCV13) vaccine. You may need this if you have certain conditions and have not been vaccinated. Pneumococcal polysaccharide (PPSV23) vaccine. You may need one or two doses if you smoke cigarettes or if you have certain conditions. Talk to your health care provider about which screenings and vaccines you need and how often you need them. This information is not intended to replace advice given to you by your health care provider. Make sure you discuss any questions you have with your health care provider. Document Released: 03/19/2015 Document Revised: 11/10/2015 Document Reviewed: 12/22/2014 Elsevier Interactive Patient Education  2017 [employer].  Fall Prevention in the  Home Falls can cause injuries. They can happen to people of all ages. There are many things you can do to make your home safe and to help prevent falls. What can I do on the outside of my home? Regularly fix the edges of walkways and driveways and fix any cracks. Remove anything that might make you trip as you walk through a door, such as a raised step or threshold. Trim any bushes or trees on the path to your home. Use bright outdoor lighting. Clear any walking paths of anything that might make someone trip, such as rocks or tools. Regularly check to see if handrails are loose or broken. Make sure that both sides of any steps have handrails. Any raised decks and porches should have guardrails on the edges. Have any leaves, snow, or ice cleared regularly. Use sand or salt on walking paths during winter. Clean up any spills in your garage right away. This includes oil or grease spills. What can I do in the bathroom? Use night lights. Install grab bars by the toilet and in the tub and shower. Do not use towel bars as grab bars. Use non-skid mats or decals in the tub or shower. If you need to sit down in the shower, use a plastic, non-slip stool. Keep the floor dry. Clean up any water that spills on the floor as soon as it happens. Remove soap buildup in the tub or shower regularly. Attach bath mats securely with double-sided non-slip rug tape. Do not have throw rugs and other things on the floor that can make you trip. What can I do in the bedroom? Use night lights. Make sure that you have a light by your bed that is easy to reach. Do not use any sheets or blankets that are too big for your bed. They should not hang down onto the floor. Have a firm chair that has side arms. You can use this for support while you get dressed. Do not have throw rugs and other things on the floor that can make you trip. What can I do in the kitchen? Clean up any spills right away. Avoid walking on wet  floors. Keep items that you use a lot in easy-to-reach places. If you need to reach something above you, use a strong step stool that has a grab bar. Keep electrical cords out of the way. Do not use floor polish or wax that makes floors slippery. If you must use wax, use non-skid floor wax. Do not have throw rugs and other things on the floor that can make you trip. What can I do with my stairs? Do not leave any items on the stairs. Make sure that there are handrails on both sides of the stairs and use them. Fix handrails that are broken or loose. Make sure that handrails are as long as the stairways. Check any carpeting to make sure that it is firmly attached to the stairs. Fix any carpet that is loose or worn. Avoid having throw rugs at the top or bottom of the stairs. If you do have throw rugs, attach them to the floor with carpet tape. Make sure that you have a light switch at the top of the stairs and the bottom of the stairs. If you do not have them, ask someone to add them for you. What else can I do to help prevent falls? Wear shoes that: Do not have high heels. Have rubber bottoms. Are comfortable and fit  you well. Are closed at the toe. Do not wear sandals. If you use a stepladder: Make sure that it is fully opened. Do not climb a closed stepladder. Make sure that both sides of the stepladder are locked into place. Ask someone to hold it for you, if possible. Clearly mark and make sure that you can see: Any grab bars or handrails. First and last steps. Where the edge of each step is. Use tools that help you move around (mobility aids) if they are needed. These include: Canes. Walkers. Scooters. Crutches. Turn on the lights when you go into a dark area. Replace any light bulbs as soon as they burn out. Set up your furniture so you have a clear path. Avoid moving your furniture around. If any of your floors are uneven, fix them. If there are any pets around you, be aware of  where they are. Review your medicines with your doctor. Some medicines can make you feel dizzy. This can increase your chance of falling. Ask your doctor what other things that you can do to help prevent falls. This information is not intended to replace advice given to you by your health care provider. Make sure you discuss any questions you have with your health care provider. Document Released: 12/17/2008 Document Revised: 07/29/2015 Document Reviewed: 03/27/2014 Elsevier Interactive Patient Education  2017 [employer].

## 2022-02-18 ENCOUNTER — Other Ambulatory Visit: Payer: Self-pay | Admitting: Family Medicine

## 2022-02-20 ENCOUNTER — Other Ambulatory Visit: Payer: Self-pay | Admitting: Family Medicine

## 2022-03-13 ENCOUNTER — Other Ambulatory Visit: Payer: Self-pay | Admitting: Family Medicine

## 2022-04-01 LAB — CMP14+EGFR
ALT: 17 IU/L (ref 0–44)
AST: 20 IU/L (ref 0–40)
Albumin/Globulin Ratio: 1.2 (ref 1.2–2.2)
Albumin: 4 g/dL — ABNORMAL LOW (ref 4.1–5.1)
Alkaline Phosphatase: 97 IU/L (ref 44–121)
BUN/Creatinine Ratio: 15 (ref 9–20)
BUN: 14 mg/dL (ref 6–24)
Bilirubin Total: 0.8 mg/dL (ref 0.0–1.2)
CO2: 25 mmol/L (ref 20–29)
Calcium: 9.3 mg/dL (ref 8.7–10.2)
Chloride: 100 mmol/L (ref 96–106)
Creatinine, Ser: 0.96 mg/dL (ref 0.76–1.27)
Globulin, Total: 3.4 g/dL (ref 1.5–4.5)
Glucose: 83 mg/dL (ref 70–99)
Potassium: 4.2 mmol/L (ref 3.5–5.2)
Sodium: 141 mmol/L (ref 134–144)
Total Protein: 7.4 g/dL (ref 6.0–8.5)
eGFR: 98 mL/min/{1.73_m2} (ref >59)

## 2022-04-01 LAB — LIPID PANEL
Chol/HDL Ratio: 2.1 ratio (ref 0.0–5.0)
Cholesterol, Total: 130 mg/dL (ref 100–199)
HDL: 62 mg/dL (ref >39)
LDL Chol Calc (NIH): 57 mg/dL (ref 0–99)
Triglycerides: 46 mg/dL (ref 0–149)
VLDL Cholesterol Cal: 11 mg/dL (ref 5–40)

## 2022-04-01 LAB — HEMOGLOBIN A1C
Est. average glucose Bld gHb Est-mCnc: 117 mg/dL
Hgb A1c MFr Bld: 5.7 % — ABNORMAL HIGH (ref 4.8–5.6)

## 2022-04-03 ENCOUNTER — Encounter: Payer: Self-pay | Admitting: Family Medicine

## 2022-04-04 ENCOUNTER — Encounter: Payer: Self-pay | Admitting: Family Medicine

## 2022-04-04 ENCOUNTER — Ambulatory Visit (INDEPENDENT_AMBULATORY_CARE_PROVIDER_SITE_OTHER): Payer: 59 | Admitting: Family Medicine

## 2022-04-04 ENCOUNTER — Ambulatory Visit (HOSPITAL_COMMUNITY)
Admission: RE | Admit: 2022-04-04 | Discharge: 2022-04-04 | Disposition: A | Discharge status: Home / Self Care | Payer: 59 | Source: Ambulatory Visit | Attending: Family Medicine | Admitting: Family Medicine

## 2022-04-04 VITALS — BP 123/79 | HR 76 | Ht 67.0 in

## 2022-04-04 DIAGNOSIS — M5416 Radiculopathy, lumbar region: Secondary | ICD-10-CM

## 2022-04-04 DIAGNOSIS — G801 Spastic diplegic cerebral palsy: Secondary | ICD-10-CM

## 2022-04-04 DIAGNOSIS — R296 Repeated falls: Secondary | ICD-10-CM

## 2022-04-04 DIAGNOSIS — F411 Generalized anxiety disorder: Secondary | ICD-10-CM

## 2022-04-04 DIAGNOSIS — M21542 Acquired clubfoot, left foot: Secondary | ICD-10-CM

## 2022-04-04 DIAGNOSIS — E785 Hyperlipidemia, unspecified: Secondary | ICD-10-CM | POA: Diagnosis not present

## 2022-04-04 DIAGNOSIS — R7303 Prediabetes: Secondary | ICD-10-CM

## 2022-04-04 DIAGNOSIS — M25371 Other instability, right ankle: Secondary | ICD-10-CM

## 2022-04-04 MED ORDER — GABAPENTIN 100 MG PO CAPS: 100.0000 mg | ORAL_CAPSULE | Freq: Every day | ORAL | 0 refills | Status: DC

## 2022-04-04 MED ORDER — METHYLPREDNISOLONE ACETATE 80 MG/ML IJ SUSP
80.0000 mg | Freq: Once | INTRAMUSCULAR | Status: AC
  Administered 2022-04-04: 80 mg via INTRAMUSCULAR

## 2022-04-04 MED ORDER — KETOROLAC TROMETHAMINE 60 MG/2ML IM SOLN
60.0000 mg | Freq: Once | INTRAMUSCULAR | Status: AC
  Administered 2022-04-04: 60 mg via INTRAMUSCULAR

## 2022-04-04 MED ORDER — PREDNISONE 10 MG PO TABS: 10.0000 mg | ORAL_TABLET | Freq: Two times a day (BID) | ORAL | 0 refills | Status: DC

## 2022-04-04 NOTE — Assessment & Plan Note (Signed)
Controlled, no change in medication  

## 2022-04-04 NOTE — Assessment & Plan Note (Signed)
  Patient re-educated about  the importance of commitment to a  minimum of 150 minutes of exercise per week as able.  The importance of healthy food choices with portion control discussed, as well as eating regularly and within a 12 hour window most days. The need to choose "clean , green" food 50 to 75% of the time is discussed, as well as to make water the primary drink and set a goal of 64 ounces water daily.       04/04/2022   11:32 AM 12/27/2021   11:10 AM 08/26/2021   11:02 AM  Weight /BMI  Weight  279 lb 1.3 oz 289 lb 1.9 oz  Height 5\' 7"  (1.702 m) 5\' 7"  (1.702 m) 5\' 7"  (1.702 m)  BMI 43.71 kg/m2 43.71 kg/m2 45.28 kg/m2    Improved needs to continue weight loss

## 2022-04-04 NOTE — Assessment & Plan Note (Signed)
Patient educated about the importance of limiting  Carbohydrate intake , the need to commit to daily physical activity for a minimum of 30 minutes , and to commit weight loss. The fact that changes in all these areas will reduce or eliminate all together the development of diabetes is stressed. Unchanged, needs to lower carb intake an portion size to facilitate weight loss     Latest Ref Rng & Units 03/31/2022   11:30 AM 08/30/2021   10:30 AM 12/23/2020    4:36 PM 06/23/2020   11:00 AM 06/23/2020   10:22 AM  Diabetic Labs  HbA1c 4.8 - 5.6 % 5.7  5.7  5.7  5.7    Chol 100 - 199 mg/dL 130  154  166     HDL >39 mg/dL 62  72  71     Calc LDL 0 - 99 mg/dL 57  74  85     Triglycerides 0 - 149 mg/dL 46  35  50     Creatinine 0.76 - 1.27 mg/dL 0.96  1.04  0.95   0.98       04/04/2022   11:32 AM 12/27/2021   11:10 AM 08/26/2021   11:02 AM 05/30/2021   12:41 PM 05/30/2021   12:40 PM 05/24/2021   11:10 AM 03/17/2021    8:09 AM  BP/Weight  Systolic BP 588 502 774 128  786 767  Diastolic BP 79 80 77 81  71 74  Wt. (Lbs)  279.08 289.12  268.04 268.04   BMI  43.71 kg/m2 45.28 kg/m2  43.26 kg/m2 43.26 kg/m2       01/18/2016   10:45 AM 09/30/2014    2:45 PM  Foot/eye exam completion dates  Foot Form Completion Done Done

## 2022-04-04 NOTE — Progress Notes (Signed)
Jamie Frost     MRN: [id number]      DOB: 1975/09/27   HPI Jamie Frost is here for follow up and re-evaluation of chronic medical conditions, medication management and review of any available recent lab and radiology data.  Preventive health is updated, specifically  Cancer screening and Immunization.   Questions or concerns regarding consultations or procedures which the PT has had in the interim are  addressed. The PT denies any adverse reactions to current medications since the last visit.  Fell on 03/25/2022, and c/o low back pain radiating down left leg, not walking as well as before ROS Denies recent fever or chills. Denies sinus pressure, nasal congestion, ear pain or sore throat. Denies chest congestion, productive cough or wheezing. Denies chest pains, palpitations and leg swelling Denies abdominal pain, nausea, vomiting,diarrhea or constipation.   Denies dysuria, frequency, hesitancy or incontinence. Denies headaches, seizures, numbness, or tingling. Denies depression, anxiety or insomnia. Denies skin break down or rash.   PE  BP 123/79 (BP Location: Right Arm, Patient Position: Sitting, Cuff Size: Large)   Pulse 76   Ht 5\' 7"  (1.702 m)   SpO2 98%   BMI 43.71 kg/m   Patient alert and oriented and in no cardiopulmonary distress.  HEENT: No facial asymmetry, EOMI,     Neck supple .  Chest: Clear to auscultation bilaterally.  CVS: S1, S2 no murmurs, no S3.Regular rate.  ABD: Soft non tender.   Ext: No edema  MS: Decreased  ROM lumbar spine, adequate in shoulders, hips and knees.  Skin: Intact, no ulcerations or rash noted.  Psych: Good eye contact, normal affect. Memory intact not anxious or depressed appearing.  CNS: CN 2-12 intact, power,  normal throughout.no focal deficits noted.   Assessment & Plan  Lumbar back pain with radiculopathy affecting left lower extremity Acute injury on 01/202/204, new back and LLE pain to knee, down anterior  thigh, toradol 60 mg IM and depo medrol 80 mg IM followed by a 5 day course of prednisone, also x ray lumbar spine  Hyperlipidemia LDL goal <100 Hyperlipidemia:Low fat diet discussed and encouraged.   Lipid Panel  Lab Results  Component Value Date   CHOL 130 03/31/2022   HDL 62 03/31/2022   LDLCALC 57 03/31/2022   TRIG 46 03/31/2022   CHOLHDL 2.1 03/31/2022     Controlled, no change in medication   Morbid obesity ([place])  Patient re-educated about  the importance of commitment to a  minimum of 150 minutes of exercise per week as able.  The importance of healthy food choices with portion control discussed, as well as eating regularly and within a 12 hour window most days. The need to choose "clean , green" food 50 to 75% of the time is discussed, as well as to make water the primary drink and set a goal of 64 ounces water daily.       04/04/2022   11:32 AM 12/27/2021   11:10 AM 08/26/2021   11:02 AM  Weight /BMI  Weight  279 lb 1.3 oz 289 lb 1.9 oz  Height 5\' 7"  (1.702 m) 5\' 7"  (1.702 m) 5\' 7"  (1.702 m)  BMI 43.71 kg/m2 43.71 kg/m2 45.28 kg/m2    Improved needs to continue weight loss  Prediabetes Patient educated about the importance of limiting  Carbohydrate intake , the need to commit to daily physical activity for a minimum of 30 minutes , and to commit weight loss. The fact that changes in  all these areas will reduce or eliminate all together the development of diabetes is stressed. Unchanged, needs to lower carb intake an portion size to facilitate weight loss     Latest Ref Rng & Units 03/31/2022   11:30 AM 08/30/2021   10:30 AM 12/23/2020    4:36 PM 06/23/2020   11:00 AM 06/23/2020   10:22 AM  Diabetic Labs  HbA1c 4.8 - 5.6 % 5.7  5.7  5.7  5.7    Chol 100 - 199 mg/dL 130  154  166     HDL >39 mg/dL 62  72  71     Calc LDL 0 - 99 mg/dL 57  74  85     Triglycerides 0 - 149 mg/dL 46  35  50     Creatinine 0.76 - 1.27 mg/dL 0.96  1.04  0.95   0.98        04/04/2022   11:32 AM 12/27/2021   11:10 AM 08/26/2021   11:02 AM 05/30/2021   12:41 PM 05/30/2021   12:40 PM 05/24/2021   11:10 AM 03/17/2021    8:09 AM  BP/Weight  Systolic BP 557 322 025 427  062 376  Diastolic BP 79 80 77 81  71 74  Wt. (Lbs)  279.08 289.12  268.04 268.04   BMI  43.71 kg/m2 45.28 kg/m2  43.26 kg/m2 43.26 kg/m2       01/18/2016   10:45 AM 09/30/2014    2:45 PM  Foot/eye exam completion dates  Foot Form Completion Done Done      Recurrent falls while walking Home safety and fall risk reduction discussed  Generalized anxiety disorder Controlled, no change in medication   Acquired equinovarus deformity of left foot Unsteady gait needs assistive device for safe ambulation, cane to  be ordered  Ankle instability, right High fall risk, needs cane for safe ambulation, will order  Spastic diplegic cerebral palsy ([place]) Need assistance with PCS, due to organic brain damage , refer for PCS services to be resumed

## 2022-04-04 NOTE — Assessment & Plan Note (Signed)
Hyperlipidemia:Low fat diet discussed and encouraged.   Lipid Panel  Lab Results  Component Value Date   CHOL 130 03/31/2022   HDL 62 03/31/2022   LDLCALC 57 03/31/2022   TRIG 46 03/31/2022   CHOLHDL 2.1 03/31/2022     Controlled, no change in medication

## 2022-04-04 NOTE — Patient Instructions (Addendum)
F/U in 4 months, call if you need me sooner   Toradol 60 mg IM and depo Medrol 80 mg IM in office , followed by 5 day course of prednisone   X ray of low back today  Excellent labs , no med change  Careful, no more falls  Thanks for choosing [facility], we consider it a privelige to serve you.

## 2022-04-04 NOTE — Assessment & Plan Note (Signed)
Home safety and fall risk reduction discussed 

## 2022-04-04 NOTE — Assessment & Plan Note (Signed)
Acute injury on 01/202/204, new back and LLE pain to knee, down anterior thigh, toradol 60 mg IM and depo medrol 80 mg IM followed by a 5 day course of prednisone, also x ray lumbar spine

## 2022-04-05 NOTE — Assessment & Plan Note (Signed)
High fall risk, needs cane for safe ambulation, will order

## 2022-04-05 NOTE — Assessment & Plan Note (Signed)
Unsteady gait needs assistive device for safe ambulation, cane to  be ordered

## 2022-04-05 NOTE — Assessment & Plan Note (Signed)
Need assistance with PCS, due to organic brain damage , refer for PCS services to be resumed

## 2022-04-18 ENCOUNTER — Encounter: Payer: Self-pay | Admitting: Family Medicine

## 2022-04-18 ENCOUNTER — Other Ambulatory Visit: Payer: Self-pay

## 2022-04-18 DIAGNOSIS — M5416 Radiculopathy, lumbar region: Secondary | ICD-10-CM

## 2022-04-18 MED ORDER — UNABLE TO FIND: 0 refills | Status: AC

## 2022-04-18 NOTE — Telephone Encounter (Signed)
Cane order sent to [employer]

## 2022-06-01 ENCOUNTER — Other Ambulatory Visit: Payer: Self-pay | Admitting: Family Medicine

## 2022-06-16 ENCOUNTER — Ambulatory Visit (INDEPENDENT_AMBULATORY_CARE_PROVIDER_SITE_OTHER): Payer: 59 | Admitting: Family Medicine

## 2022-06-16 ENCOUNTER — Encounter: Payer: Self-pay | Admitting: Family Medicine

## 2022-06-16 VITALS — BP 135/81 | HR 100 | Resp 16 | Ht 67.0 in | Wt 263.1 lb

## 2022-06-16 DIAGNOSIS — E559 Vitamin D deficiency, unspecified: Secondary | ICD-10-CM

## 2022-06-16 DIAGNOSIS — R350 Frequency of micturition: Secondary | ICD-10-CM | POA: Diagnosis not present

## 2022-06-16 DIAGNOSIS — Z9181 History of falling: Secondary | ICD-10-CM

## 2022-06-16 DIAGNOSIS — R32 Unspecified urinary incontinence: Secondary | ICD-10-CM

## 2022-06-16 DIAGNOSIS — F411 Generalized anxiety disorder: Secondary | ICD-10-CM

## 2022-06-16 DIAGNOSIS — M5442 Lumbago with sciatica, left side: Secondary | ICD-10-CM

## 2022-06-16 DIAGNOSIS — M25551 Pain in right hip: Secondary | ICD-10-CM

## 2022-06-16 DIAGNOSIS — R296 Repeated falls: Secondary | ICD-10-CM

## 2022-06-16 DIAGNOSIS — R7303 Prediabetes: Secondary | ICD-10-CM

## 2022-06-16 DIAGNOSIS — R319 Hematuria, unspecified: Secondary | ICD-10-CM

## 2022-06-16 DIAGNOSIS — G8929 Other chronic pain: Secondary | ICD-10-CM

## 2022-06-16 DIAGNOSIS — M25552 Pain in left hip: Secondary | ICD-10-CM

## 2022-06-16 DIAGNOSIS — R159 Full incontinence of feces: Secondary | ICD-10-CM

## 2022-06-16 DIAGNOSIS — D539 Nutritional anemia, unspecified: Secondary | ICD-10-CM

## 2022-06-16 DIAGNOSIS — W19XXXD Unspecified fall, subsequent encounter: Secondary | ICD-10-CM

## 2022-06-16 DIAGNOSIS — E785 Hyperlipidemia, unspecified: Secondary | ICD-10-CM

## 2022-06-16 LAB — POCT URINALYSIS DIP (CLINITEK)
Bilirubin, UA: NEGATIVE
Glucose, UA: NEGATIVE mg/dL
Ketones, POC UA: NEGATIVE mg/dL
Leukocytes, UA: NEGATIVE
Nitrite, UA: NEGATIVE
POC PROTEIN,UA: NEGATIVE
Spec Grav, UA: 1.025 (ref 1.010–1.025)
Urobilinogen, UA: 0.2 E.U./dL
pH, UA: 6 (ref 5.0–8.0)

## 2022-06-16 MED ORDER — LORATADINE 10 MG PO TABS: ORAL_TABLET | ORAL | 1 refills | Status: DC

## 2022-06-16 MED ORDER — VITAMIN D (ERGOCALCIFEROL) 1.25 MG (50000 UNIT) PO CAPS: 50000.0000 [IU] | ORAL_CAPSULE | ORAL | 1 refills | Status: DC

## 2022-06-16 MED ORDER — SIMVASTATIN 40 MG PO TABS: 40.0000 mg | ORAL_TABLET | Freq: Every day | ORAL | 1 refills | Status: DC

## 2022-06-16 MED ORDER — GABAPENTIN 100 MG PO CAPS: 100.0000 mg | ORAL_CAPSULE | Freq: Every day | ORAL | 3 refills | Status: DC

## 2022-06-16 NOTE — Patient Instructions (Addendum)
F/U in 10 weeks re evaluate weight and pain, call iof tyou need em sooner  CBc, iron, B12, TSH, Vit D, CCUA and reflex c/s if abn  Medication is simvastatin, buspirone and flonse OK to finish the iron you have   More info when we get labs  Someone need  to  supervise his med  You are referred to Ortho re hip and leg pain after falling  Thanks for choosing [facility], we consider it a privelige to serve you.

## 2022-06-16 NOTE — Progress Notes (Unsigned)
   Jamie Frost     MRN: [id number]      DOB: Sep 21, 1975   HPI Jamie Frost is here for follow up and re-evaluation of chronic medical conditions, medication management and review of any available recent lab and radiology data.  Preventive health is updated, specifically  Cancer screening and Immunization.   Questions or concerns regarding consultations or procedures which the PT has had in the interim are  addressed. The PT denies any adverse reactions to current medications since the last visit.  There are no new concerns.  There are no specific complaints   ROS Denies recent fever or chills. Denies sinus pressure, nasal congestion, ear pain or sore throat. Denies chest congestion, productive cough or wheezing. Denies chest pains, palpitations and leg swelling Denies abdominal pain, nausea, vomiting,diarrhea or constipation.   Denies dysuria, frequency, hesitancy or incontinence. Denies joint pain, swelling and limitation in mobility. Denies headaches, seizures, numbness, or tingling. Denies depression, anxiety or insomnia. Denies skin break down or rash.   PE  BP 135/81   Pulse 100   Resp 16   Ht 5\' 7"  (1.702 m)   Wt 263 lb 1.9 oz (119.4 kg)   SpO2 94%   BMI 41.21 kg/m   Patient alert and oriented and in no cardiopulmonary distress.  HEENT: No facial asymmetry, EOMI,     Neck supple .  Chest: Clear to auscultation bilaterally.  CVS: S1, S2 no murmurs, no S3.Regular rate.  ABD: Soft non tender.   Ext: No edema  MS: Adequate ROM spine, shoulders, hips and knees.  Skin: Intact, no ulcerations or rash noted.  Psych: Good eye contact, normal affect. Memory intact not anxious or depressed appearing.  CNS: CN 2-12 intact, power,  normal throughout.no focal deficits noted.   Assessment & Plan  No problem-specific Assessment & Plan notes found for this encounter.

## 2022-06-17 LAB — VITAMIN D 25 HYDROXY (VIT D DEFICIENCY, FRACTURES): Vit D, 25-Hydroxy: 46.1 ng/mL (ref 30.0–100.0)

## 2022-06-17 LAB — CBC WITH DIFFERENTIAL/PLATELET
Basophils Absolute: 0.1 10*3/uL (ref 0.0–0.2)
Basos: 0 %
EOS (ABSOLUTE): 0.2 10*3/uL (ref 0.0–0.4)
Eos: 2 %
Hematocrit: 44.2 % (ref 37.5–51.0)
Hemoglobin: 13.2 g/dL (ref 13.0–17.7)
Immature Grans (Abs): 0 10*3/uL (ref 0.0–0.1)
Immature Granulocytes: 0 %
Lymphocytes Absolute: 2.3 10*3/uL (ref 0.7–3.1)
Lymphs: 20 %
MCH: 26.2 pg — ABNORMAL LOW (ref 26.6–33.0)
MCHC: 29.9 g/dL — ABNORMAL LOW (ref 31.5–35.7)
MCV: 88 fL (ref 79–97)
Monocytes Absolute: 1 10*3/uL — ABNORMAL HIGH (ref 0.1–0.9)
Monocytes: 9 %
Neutrophils Absolute: 8 10*3/uL — ABNORMAL HIGH (ref 1.4–7.0)
Neutrophils: 69 %
Platelets: 259 10*3/uL (ref 150–450)
RBC: 5.04 x10E6/uL (ref 4.14–5.80)
RDW: 14.1 % (ref 11.6–15.4)
WBC: 11.7 10*3/uL — ABNORMAL HIGH (ref 3.4–10.8)

## 2022-06-17 LAB — TSH: TSH: 3.14 u[IU]/mL (ref 0.450–4.500)

## 2022-06-17 LAB — VITAMIN B12: Vitamin B-12: 729 pg/mL (ref 232–1245)

## 2022-06-17 LAB — IRON: Iron: 45 ug/dL (ref 38–169)

## 2022-06-20 LAB — URINE CULTURE

## 2022-06-22 ENCOUNTER — Encounter: Payer: Self-pay | Admitting: Family Medicine

## 2022-06-22 ENCOUNTER — Ambulatory Visit: Payer: 59 | Admitting: Family Medicine

## 2022-06-22 DIAGNOSIS — R350 Frequency of micturition: Secondary | ICD-10-CM | POA: Insufficient documentation

## 2022-06-22 DIAGNOSIS — R32 Unspecified urinary incontinence: Secondary | ICD-10-CM | POA: Insufficient documentation

## 2022-06-22 DIAGNOSIS — M25551 Pain in right hip: Secondary | ICD-10-CM | POA: Insufficient documentation

## 2022-06-22 DIAGNOSIS — W19XXXA Unspecified fall, initial encounter: Secondary | ICD-10-CM | POA: Insufficient documentation

## 2022-06-22 DIAGNOSIS — R159 Full incontinence of feces: Secondary | ICD-10-CM | POA: Insufficient documentation

## 2022-06-22 DIAGNOSIS — R319 Hematuria, unspecified: Secondary | ICD-10-CM | POA: Insufficient documentation

## 2022-06-22 NOTE — Assessment & Plan Note (Signed)
3 month history reported in 06/2022, needs urology eval as hematuria also noted on exam, no infection present

## 2022-06-22 NOTE — Assessment & Plan Note (Signed)
Intermittent episodes of fecal incontinence reported in 06/2022 x 3 months, and associated with new /  increased urinary incontinence

## 2022-06-22 NOTE — Assessment & Plan Note (Signed)
Home safety reviewed , Ortho eval indicated based on current presentation

## 2022-06-22 NOTE — Assessment & Plan Note (Signed)
Patient educated about the importance of limiting  Carbohydrate intake , the need to commit to daily physical activity for a minimum of 30 minutes , and to commit weight loss. The fact that changes in all these areas will reduce or eliminate all together the development of diabetes is stressed.      Latest Ref Rng & Units 03/31/2022   11:30 AM 08/30/2021   10:30 AM 12/23/2020    4:36 PM 06/23/2020   11:00 AM 06/23/2020   10:22 AM  Diabetic Labs  HbA1c 4.8 - 5.6 % 5.7  5.7  5.7  5.7    Chol 100 - 199 mg/dL 161  096  045     HDL >40 mg/dL 62  72  71     Calc LDL 0 - 99 mg/dL 57  74  85     Triglycerides 0 - 149 mg/dL 46  35  50     Creatinine 0.76 - 1.27 mg/dL 9.81  1.91  4.78   2.95       06/16/2022   10:02 AM 04/04/2022   11:32 AM 12/27/2021   11:10 AM 08/26/2021   11:02 AM 05/30/2021   12:41 PM 05/30/2021   12:40 PM 05/24/2021   11:10 AM  BP/Weight  Systolic BP 135 123 121 122 129  110  Diastolic BP 81 79 80 77 81  71  Wt. (Lbs) 263.12  279.08 289.12  268.04 268.04  BMI 41.21 kg/m2  43.71 kg/m2 45.28 kg/m2  43.26 kg/m2 43.26 kg/m2      01/18/2016   10:45 AM 09/30/2014    2:45 PM  Foot/eye exam completion dates  Foot Form Completion Done Done

## 2022-06-22 NOTE — Assessment & Plan Note (Addendum)
Ongoing and increased pain in right ip with recurrent falls weekly since Jan , 2024, Ortho eval asap,

## 2022-06-22 NOTE — Assessment & Plan Note (Signed)
Refr urology, this is new also has incontinence which is new

## 2022-06-22 NOTE — Assessment & Plan Note (Signed)
Ongoing and worsening right hip pain s/p fall approx 3 months ago, ambulation afffected, falling on avg 1 to 2 times per week and c/o pain, needs Ortho eval

## 2022-06-22 NOTE — Assessment & Plan Note (Signed)
  Patient re-educated about  the importance of commitment to a  minimum of 150 minutes of exercise per week as able.  The importance of healthy food choices with portion control discussed, as well as eating regularly and within a 12 hour window most days. The need to choose "clean , green" food 50 to 75% of the time is discussed, as well as to make water the primary drink and set a goal of 64 ounces water daily.       06/16/2022   10:02 AM 04/04/2022   11:32 AM 12/27/2021   11:10 AM  Weight /BMI  Weight 263 lb 1.9 oz  279 lb 1.3 oz  Height  (1.702 m)  (1.702 m)  (1.702 m)  BMI 41.21 kg/m2 43.71 kg/m2 43.71 kg/m2

## 2022-06-22 NOTE — Assessment & Plan Note (Signed)
Controlled, no change in medication  

## 2022-06-22 NOTE — Assessment & Plan Note (Signed)
CCUA shows blood only, no infection,will need eval for hematuria if persists

## 2022-06-30 ENCOUNTER — Other Ambulatory Visit (INDEPENDENT_AMBULATORY_CARE_PROVIDER_SITE_OTHER): Payer: 59

## 2022-06-30 ENCOUNTER — Ambulatory Visit (INDEPENDENT_AMBULATORY_CARE_PROVIDER_SITE_OTHER): Payer: 59 | Admitting: Orthopedic Surgery

## 2022-06-30 ENCOUNTER — Encounter: Payer: Self-pay | Admitting: Orthopedic Surgery

## 2022-06-30 VITALS — BP 116/70 | HR 80 | Ht 67.0 in | Wt 263.0 lb

## 2022-06-30 DIAGNOSIS — M5136 Other intervertebral disc degeneration, lumbar region: Secondary | ICD-10-CM

## 2022-06-30 DIAGNOSIS — M25552 Pain in left hip: Secondary | ICD-10-CM

## 2022-06-30 NOTE — Progress Notes (Signed)
Office Visit Note   Patient: Jamie Frost           Date of Birth: 1976-01-15           MRN: [id number] Visit Date: 06/30/2022 Requested by: Kerri Perches, MD [address] [place],  [place] [address] PCP: Kerri Perches, MD   Assessment & Plan:   47 year old male with some mental deficiency BMI 41 walks with a cane had a fall continues to fall frequently no evidence of fracture lumbar spine or hip or thigh  Complains of thigh pain  Recommend physical therapy for strengthening and lumbar spine exercises  No indications for surgery and no fracture  Most likely his pain is coming from his spinal disease  No orders of the defined types were placed in this encounter.    Subjective: Chief Complaint  Patient presents with   Back Pain    Has had back pain and left leg / thigh pain now pain is in the left thigh     HPI: 47 year old male who had some type of mental deficiency presents for evaluation of left leg pain.  The patient fell in January had some back pain x-rays were negative continues to have left thigh pain  Patient has frequent falls and is not walking as well as he did prior to the last fall in January  He has not had any physical therapy, he is only on Tylenol for pain              ROS: Could not assess the review of systems secondary to the patient's mental deficiency.  The patient is here with a driver who does not know his history.  He did show me some text messages that were sent from his primary caregiver and relative but it does not add any relevant history to the evaluation.   Images personally read and my interpretation : Outside imaging 5 views lumbar spine show some mild arthritis no fracture no dislocation no subluxation  Visit Diagnoses:  1. Pain in left hip   2. DDD (degenerative disc disease), lumbar      Follow-Up Instructions: Return for NO FU SCHEDULED.    Objective: Vital Signs: BP 116/70   Pulse 80   Ht 5'  7" (1.702 m)   Wt 263 lb (119.3 kg)   BMI 41.19 kg/m   Physical Exam Constitutional:      General: He is not in acute distress.    Appearance: He is obese.  Skin:    General: Skin is warm and dry.     Coloration: Skin is not jaundiced.  Neurological:     General: No focal deficit present.     Mental Status: He is alert. Mental status is at baseline.     Gait: Gait abnormal.     Comments: Gait requires a cane  Psychiatric:        Mood and Affect: Mood normal.        Behavior: Behavior normal.      Right Hip Exam   Comments:  Right hip normal range of motion no pain no tenderness   Left Hip Exam   Comments:  Left hip normal passive range of motion painful active range of motion with hip flexion with weak hip flexion  Nontender lumbar spine       Specialty Comments:  No specialty comments available.  Imaging: DG HIP UNILAT WITH PELVIS 2-3 VIEWS LEFT  Result Date: 06/30/2022 [facility] pelvis Patient  with left thigh pain Status post fall X-rays show normal femoral neck-shaft angle normal femoral head normal acetabulum No evidence of arthritis or fracture Impression normal pelvis and normal hip     PMFS History: Patient Active Problem List   Diagnosis Date Noted   Right hip pain 06/22/2022   Fall 06/22/2022   Urinary frequency 06/22/2022   Incontinence 06/22/2022   Hematuria 06/22/2022   Incontinence of feces 06/22/2022   Lumbar back pain with radiculopathy affecting left lower extremity 04/04/2022   Encounter for screening colonoscopy 01/18/2021   Ankle instability, right    Acquired equinovarus deformity of left foot    Body mass index 40.0-44.9, adult (HCC) 07/16/2018   Spastic diplegic cerebral palsy (HCC) 07/16/2018   Equinus contracture of left ankle 07/16/2018   Cavovarus deformity of foot, acquired, left 07/16/2018   Recurrent falls while walking 07/10/2018   Prediabetes 08/12/2016   Pulmonary hypoplasia 09/02/2010   Generalized anxiety disorder  10/11/2008   Hyperlipidemia LDL goal <100 09/23/2007   Morbid obesity (HCC) 09/23/2007   ALLERGIC RHINITIS, SEASONAL 09/23/2007   Past Medical History:  Diagnosis Date   Allergic rhinitis, seasonal    Ambulates with cane    straight   Arthritis    Diabetes mellitus without complication (HCC)    type 2 - no meds   Hyperlipidemia    Hypertension    Impaired fasting glucose 12/09/2009   Obesity    Pulmonary hypoplasia    Right leg injury    wears brace LLE - per pt "foot turns over without brace"    Family History  Problem Relation Age of Onset   Obesity Mother    Hypertension Mother    Arthritis Mother    Colon cancer Neg Hx    Colon polyps Neg Hx     Past Surgical History:  Procedure Laterality Date   ANKLE FUSION Left 08/09/2018   Procedure: LEFT TIBIO-CALCANEAL FUSION;  Surgeon: Nadara Mustard, MD;  Location: [facility] OR;  Service: Orthopedics;  Laterality: Left;   ANKLE FUSION Right 06/25/2020   Procedure: RIGHT TIBIOCALCANEAL FUSION;  Surgeon: Nadara Mustard, MD;  Location: [facility] OR;  Service: Orthopedics;  Laterality: Right;   bilateral inguinal hernia     COLONOSCOPY WITH PROPOFOL N/A 03/17/2021   Procedure: COLONOSCOPY WITH PROPOFOL;  Surgeon: Corbin Ade, MD;  Location[facility];  Service: Endoscopy;  Laterality: N/A;  10:15am   I/D abcess on back  2007   1 WEEK    right great toe surgery     Social History   Occupational History   Not on file  Tobacco Use   Smoking status: Never   Smokeless tobacco: Never  Vaping Use   Vaping Use: Never used  Substance and Sexual Activity   Alcohol use: No   Drug use: No   Sexual activity: Not Currently

## 2022-06-30 NOTE — Patient Instructions (Addendum)
No evidence of fracture in his hip or back  Falls are most likely coming from his lumbar spine disease  Recommend physical therapy for strengthening and stabilization of his spine  Take Tylenol for pain  No other medications are recommended at this time based on his urine testing for blood  Physical therapy has been ordered for you at [facility]  They should call you to schedule, [phone] is the phone number to call, if you want to call to schedule.

## 2022-07-04 ENCOUNTER — Telehealth: Payer: 59 | Admitting: Physician Assistant

## 2022-07-04 DIAGNOSIS — J101 Influenza due to other identified influenza virus with other respiratory manifestations: Secondary | ICD-10-CM | POA: Diagnosis not present

## 2022-07-04 MED ORDER — OSELTAMIVIR PHOSPHATE 75 MG PO CAPS
75.0000 mg | ORAL_CAPSULE | Freq: Two times a day (BID) | ORAL | 0 refills | Status: DC
Start: 2022-07-04 — End: 2022-08-08

## 2022-07-04 NOTE — Progress Notes (Signed)
E visit for Flu like symptoms   We are sorry that you are not feeling well.  Here is how we plan to help! Based on what you have shared with me it looks like you may have a respiratory virus that may be influenza.  Influenza or "the flu" is   an infection caused by a respiratory virus. The flu virus is highly contagious and persons who did not receive their yearly flu vaccination may "catch" the flu from close contact.  We have anti-viral medications to treat the viruses that cause this infection. They are not a "cure" and only shorten the course of the infection. These prescriptions are most effective when they are given within the first 2 days of "flu" symptoms. Antiviral medication are indicated if you have a high risk of complications from the flu. You should  also consider an antiviral medication if you are in close contact with someone who is at risk. These medications can help patients avoid complications from the flu  but have side effects that you should know. Possible side effects from Tamiflu or oseltamivir include nausea, vomiting, diarrhea, dizziness, headaches, eye redness, sleep problems or other respiratory symptoms. You should not take Tamiflu if you have an allergy to oseltamivir or any to the ingredients in Tamiflu.  Based upon your symptoms and potential risk factors I have prescribed Oseltamivir (Tamiflu).  It has been sent to your designated pharmacy.  You will take one 75 mg capsule orally twice a day for the next 5 days.  ANYONE WHO HAS FLU SYMPTOMS SHOULD: Stay home. The flu is highly contagious and going out or to work exposes others! Be sure to drink plenty of fluids. Water is fine as well as fruit juices, sodas and electrolyte beverages. You may want to stay away from caffeine or alcohol. If you are nauseated, try taking small sips of liquids. How do you know if you are getting enough fluid? Your urine should be a pale yellow or almost colorless. Get rest. Taking a steamy  shower or using a humidifier may help nasal congestion and ease sore throat pain. Using a saline nasal spray works much the same way. Cough drops, hard candies and sore throat lozenges may ease your cough. Line up a caregiver. Have someone check on you regularly.   GET HELP RIGHT AWAY IF: You cannot keep down liquids or your medications. You become short of breath Your fell like you are going to pass out or loose consciousness. Your symptoms persist after you have completed your treatment plan MAKE SURE YOU  Understand these instructions. Will watch your condition. Will get help right away if you are not doing well or get worse.  Your e-visit answers were reviewed by a board certified advanced clinical practitioner to complete your personal care plan.  Depending on the condition, your plan could have included both over the counter or prescription medications.  If there is a problem please reply  once you have received a response from your provider.  Your safety is important to us.  If you have drug allergies check your prescription carefully.    You can use MyChart to ask questions about today's visit, request a non-urgent call back, or ask for a work or school excuse for 24 hours related to this e-Visit. If it has been greater than 24 hours you will need to follow up with your provider, or enter a new e-Visit to address those concerns.  You will get an e-mail in the next   two days asking about your experience.  I hope that your e-visit has been valuable and will speed your recovery. Thank you for using e-visits.  I have spent 5 minutes in review of e-visit questionnaire, review and updating patient chart, medical decision making and response to patient.   Masao Junker M Kalicia Dufresne, PA-C  

## 2022-07-13 ENCOUNTER — Encounter: Payer: Self-pay | Admitting: Family Medicine

## 2022-07-18 ENCOUNTER — Other Ambulatory Visit: Payer: Self-pay

## 2022-07-18 MED ORDER — LORATADINE 10 MG PO TABS: ORAL_TABLET | ORAL | 1 refills | Status: DC

## 2022-07-18 NOTE — Telephone Encounter (Signed)
I don't see vitamin d on his list. I refilled claritin and the filled out the pcs form. Ok to refill vitamin D or does he no longer need it?

## 2022-07-19 ENCOUNTER — Other Ambulatory Visit: Payer: Self-pay

## 2022-07-19 DIAGNOSIS — E559 Vitamin D deficiency, unspecified: Secondary | ICD-10-CM

## 2022-07-19 MED ORDER — VITAMIN D (ERGOCALCIFEROL) 1.25 MG (50000 UNIT) PO CAPS
50000.0000 [IU] | ORAL_CAPSULE | ORAL | 8 refills | Status: DC
Start: 2022-07-19 — End: 2022-07-20

## 2022-07-19 NOTE — Assessment & Plan Note (Signed)
Weekly vit d x 9 months prescribed in 07/2022, needs rept lab in 03/2023

## 2022-07-19 NOTE — Progress Notes (Signed)
Weekly vit D sent to [facility] x 9 months, paperwork completed top be sent off

## 2022-07-20 ENCOUNTER — Other Ambulatory Visit: Payer: Self-pay

## 2022-07-20 ENCOUNTER — Ambulatory Visit: Payer: 59 | Attending: Orthopedic Surgery | Admitting: Physical Therapy

## 2022-07-20 DIAGNOSIS — M25552 Pain in left hip: Secondary | ICD-10-CM | POA: Insufficient documentation

## 2022-07-20 DIAGNOSIS — M5136 Other intervertebral disc degeneration, lumbar region: Secondary | ICD-10-CM | POA: Insufficient documentation

## 2022-07-20 MED ORDER — VITAMIN D (ERGOCALCIFEROL) 1.25 MG (50000 UNIT) PO CAPS: 50000.0000 [IU] | ORAL_CAPSULE | ORAL | 0 refills | Status: DC

## 2022-07-20 NOTE — Therapy (Signed)
OUTPATIENT PHYSICAL THERAPY LOWER EXTREMITY EVALUATION   Patient Name: Jamie Frost MRN: [id number] DOB:[birth year], 47 y.o., male Today's Date: 07/20/2022  END OF SESSION:  PT End of Session - 07/20/22 1337     Visit Number 1    Number of Visits 8    Date for PT Re-Evaluation 10/18/22    PT Start Time 0110    PT Stop Time 0140    PT Time Calculation (min) 30 min    Equipment Utilized During Treatment Gait belt    Activity Tolerance Patient tolerated treatment well    Behavior During Therapy WFL for tasks assessed/performed             Past Medical History:  Diagnosis Date   Allergic rhinitis, seasonal    Ambulates with cane    straight   Arthritis    Diabetes mellitus without complication (HCC)    type 2 - no meds   Hyperlipidemia    Hypertension    Impaired fasting glucose 12/09/2009   Obesity    Pulmonary hypoplasia    Right leg injury    wears brace LLE - per pt "foot turns over without brace"   Past Surgical History:  Procedure Laterality Date   ANKLE FUSION Left 08/09/2018   Procedure: LEFT TIBIO-CALCANEAL FUSION;  Surgeon: Nadara Mustard, MD;  Location: [facility] OR;  Service: Orthopedics;  Laterality: Left;   ANKLE FUSION Right 06/25/2020   Procedure: RIGHT TIBIOCALCANEAL FUSION;  Surgeon: Nadara Mustard, MD;  Location: [facility] OR;  Service: Orthopedics;  Laterality: Right;   bilateral inguinal hernia     COLONOSCOPY WITH PROPOFOL N/A 03/17/2021   Procedure: COLONOSCOPY WITH PROPOFOL;  Surgeon: Corbin Ade, MD;  Location: [facility];  Service: Endoscopy;  Laterality: N/A;  10:15am   I/D abcess on back  2007   1 WEEK    right great toe surgery     Patient Active Problem List   Diagnosis Date Noted   Vitamin D deficiency 07/19/2022   Right hip pain 06/22/2022   Fall 06/22/2022   Urinary frequency 06/22/2022   Incontinence 06/22/2022   Hematuria 06/22/2022   Incontinence of feces 06/22/2022   Lumbar back pain with radiculopathy affecting left lower  extremity 04/04/2022   Encounter for screening colonoscopy 01/18/2021   Ankle instability, right    Acquired equinovarus deformity of left foot    Body mass index 40.0-44.9, adult (HCC) 07/16/2018   Spastic diplegic cerebral palsy (HCC) 07/16/2018   Equinus contracture of left ankle 07/16/2018   Cavovarus deformity of foot, acquired, left 07/16/2018   Recurrent falls while walking 07/10/2018   Prediabetes 08/12/2016   Pulmonary hypoplasia 09/02/2010   Generalized anxiety disorder 10/11/2008   Hyperlipidemia LDL goal <100 09/23/2007   Morbid obesity (HCC) 09/23/2007   ALLERGIC RHINITIS, SEASONAL 09/23/2007    REFERRING PROVIDER: Fuller Canada MD  REFERRING DIAG: Pain in left hip and DDD (lumbar).  THERAPY DIAG:  Pain in left hip  Rationale for Evaluation and Treatment: Rehabilitation  ONSET DATE: 04/02/22.  SUBJECTIVE:   SUBJECTIVE STATEMENT: The patient presents to the clinic with his Father Joe.  They states he had a fall on 04/02/22 while backing up and falling into a metal lift chair. He states no other falls.  When explained and shown the pain scale he said his pain is at a 10.  He described and pointed to his pain which was over the left buttock, left hip and anterior thigh.  It is too painful for  him to lie on his left side.  PERTINENT HISTORY: See above, mental deficiency.   PAIN:  Are you having pain? Yes: NPRS scale: 10/10 Pain location: See above. Pain description: "Hurts." Aggravating factors: Movement. Relieving factors: Not moving.  PRECAUTIONS: Fall.  Patient uses a cane.  Please use gait belt if walking.  WEIGHT BEARING RESTRICTIONS: No  FALLS:  Has patient fallen in last 6 months? Yes. Number of falls 1.  LIVING ENVIRONMENT: Lives with: lives with their family Lives in: House/apartment Has following equipment at home: Single point cane  PLOF: Independent with basic ADLs and Independent with household mobility with device  PATIENT GOALS: Not  have pain.   OBJECTIVE:   DIAGNOSTIC FINDINGS:   Mild degenerative changes at L4-L5 and L5-S1.  X-rays show normal femoral neck-shaft angle normal femoral head normal acetabulum   No evidence of arthritis or fracture   Impression normal pelvis and normal hip  PALPATION: Patient reporting pain over his left buttock region and pain over left anterior thigh though not particularly tender to palpation.  He did not c/o of pain in his lumbar per palpation.  LOWER EXTREMITY ROM:  In supine he attempted to flexion his left hip but was in a great deal of pain.    LOWER EXTREMITY MMT:  He performed antigravity knee extension but was unable to raise his left LE against gravity.    GAIT: Patient walked in clinic with a straight cane with gait belt utilized.  He walked with essentially minimal hip motion and knees in extension.  A "waddling-type" gait pattern.  ASSESSMENT:  CLINICAL IMPRESSION: The patient presents to [facility] with his Father.  He feel on 04/02/22 onto a hard object producing a great deal of pain.  He reports pain over his left buttock and anterior thigh.  Attempts at left hip antigravity movement produced severe pain.  He had no significant palpable pain.  He cannot lie on his left side due to pain.  He uses a cane for ambulation with notable gait deviations.  Patient will benefit from skilled physical therapy intervention to address pain and deficits.  OBJECTIVE IMPAIRMENTS: Abnormal gait, decreased activity tolerance, decreased ROM, decreased strength, and pain.   ACTIVITY LIMITATIONS: locomotion level  PERSONAL FACTORS: 1 comorbidity: mental deficiency   are also affecting patient's functional outcome.   REHAB POTENTIAL: Good  CLINICAL DECISION MAKING: Stable/uncomplicated  EVALUATION COMPLEXITY: Low   GOALS:  SHORT TERM GOALS: Target date: 08/03/22.  Ind with a HEP. Goal status: INITIAL  LONG TERM GOALS: Target date: 10/18/22  Perform a left SLR with pain not  > 3/10.  Goal status: INITIAL  2.  Eliminate left LE symptoms.  Goal status: INITIAL  PLAN:  PT FREQUENCY: 1-2x/week  PT DURATION: 6 weeks  PLANNED INTERVENTIONS: Therapeutic exercises, Therapeutic activity, Neuromuscular re-education, Gait training, Patient/Family education, Self Care, Electrical stimulation, Cryotherapy, Moist heat, Ultrasound, and Manual therapy  PLAN FOR NEXT SESSION: Nustep, therex, modalities and STW/M.   Terrianna Holsclaw, [place], PT 07/20/2022, 3:39 PM

## 2022-07-21 ENCOUNTER — Other Ambulatory Visit: Payer: Self-pay

## 2022-07-21 MED ORDER — VITAMIN D (ERGOCALCIFEROL) 1.25 MG (50000 UNIT) PO CAPS: 50000.0000 [IU] | ORAL_CAPSULE | ORAL | 3 refills | Status: DC

## 2022-07-24 ENCOUNTER — Ambulatory Visit: Payer: 59

## 2022-07-24 DIAGNOSIS — M25552 Pain in left hip: Secondary | ICD-10-CM

## 2022-07-24 NOTE — Therapy (Signed)
OUTPATIENT PHYSICAL THERAPY LOWER EXTREMITY EVALUATION   Patient Name: Jamie Frost MRN: [id number] DOB:1975/10/23, 47 y.o., male Today's Date: 07/24/2022  END OF SESSION:  PT End of Session - 07/24/22 0949     Visit Number 2    Number of Visits 8    Date for PT Re-Evaluation 10/18/22    PT Start Time 0945    PT Stop Time 1035    PT Time Calculation (min) 50 min    Equipment Utilized During Treatment Gait belt    Activity Tolerance Patient tolerated treatment well    Behavior During Therapy WFL for tasks assessed/performed             Past Medical History:  Diagnosis Date   Allergic rhinitis, seasonal    Ambulates with cane    straight   Arthritis    Diabetes mellitus without complication (HCC)    type 2 - no meds   Hyperlipidemia    Hypertension    Impaired fasting glucose 12/09/2009   Obesity    Pulmonary hypoplasia    Right leg injury    wears brace LLE - per pt "foot turns over without brace"   Past Surgical History:  Procedure Laterality Date   ANKLE FUSION Left 08/09/2018   Procedure: LEFT TIBIO-CALCANEAL FUSION;  Surgeon: Nadara Mustard, MD;  Location: [facility] OR;  Service: Orthopedics;  Laterality: Left;   ANKLE FUSION Right 06/25/2020   Procedure: RIGHT TIBIOCALCANEAL FUSION;  Surgeon: Nadara Mustard, MD;  Location: [facility] OR;  Service: Orthopedics;  Laterality: Right;   bilateral inguinal hernia     COLONOSCOPY WITH PROPOFOL N/A 03/17/2021   Procedure: COLONOSCOPY WITH PROPOFOL;  Surgeon: Corbin Ade, MD;  Location: [facility];  Service: Endoscopy;  Laterality: N/A;  10:15am   I/D abcess on back  2007   1 WEEK    right great toe surgery     Patient Active Problem List   Diagnosis Date Noted   Vitamin D deficiency 07/19/2022   Right hip pain 06/22/2022   Fall 06/22/2022   Urinary frequency 06/22/2022   Incontinence 06/22/2022   Hematuria 06/22/2022   Incontinence of feces 06/22/2022   Lumbar back pain with radiculopathy affecting left lower  extremity 04/04/2022   Encounter for screening colonoscopy 01/18/2021   Ankle instability, right    Acquired equinovarus deformity of left foot    Body mass index 40.0-44.9, adult (HCC) 07/16/2018   Spastic diplegic cerebral palsy (HCC) 07/16/2018   Equinus contracture of left ankle 07/16/2018   Cavovarus deformity of foot, acquired, left 07/16/2018   Recurrent falls while walking 07/10/2018   Prediabetes 08/12/2016   Pulmonary hypoplasia 09/02/2010   Generalized anxiety disorder 10/11/2008   Hyperlipidemia LDL goal <100 09/23/2007   Morbid obesity (HCC) 09/23/2007   ALLERGIC RHINITIS, SEASONAL 09/23/2007    REFERRING PROVIDER: Fuller Canada MD  REFERRING DIAG: Pain in left hip and DDD (lumbar).  THERAPY DIAG:  Pain in left hip  Rationale for Evaluation and Treatment: Rehabilitation  ONSET DATE: 04/02/22.  SUBJECTIVE:   SUBJECTIVE STATEMENT: Pt ambulates into the facility with SPC and minA +1.  Pt reports "a little" hip pain today, but does not give a number.  PERTINENT HISTORY: See above, mental deficiency.   PAIN:  Are you having pain? Yes: NPRS scale: does not give a number/10 Pain location: See above. Pain description: "Hurts." Aggravating factors: Movement. Relieving factors: Not moving.  PRECAUTIONS: Fall.  Patient uses a cane.  Please use gait belt if  walking.  WEIGHT BEARING RESTRICTIONS: No  FALLS:  Has patient fallen in last 6 months? Yes. Number of falls 1.  LIVING ENVIRONMENT: Lives with: lives with their family Lives in: House/apartment Has following equipment at home: Single point cane  PLOF: Independent with basic ADLs and Independent with household mobility with device  PATIENT GOALS: Not have pain.   OBJECTIVE:   DIAGNOSTIC FINDINGS:   Mild degenerative changes at L4-L5 and L5-S1.  X-rays show normal femoral neck-shaft angle normal femoral head normal acetabulum   No evidence of arthritis or fracture   Impression normal pelvis  and normal hip  PALPATION: Patient reporting pain over his left buttock region and pain over left anterior thigh though not particularly tender to palpation.  He did not c/o of pain in his lumbar per palpation.  LOWER EXTREMITY ROM:  In supine he attempted to flexion his left hip but was in a great deal of pain.    LOWER EXTREMITY MMT:  He performed antigravity knee extension but was unable to raise his left LE against gravity.    GAIT: Patient walked in clinic with a straight cane with gait belt utilized.  He walked with essentially minimal hip motion and knees in extension.  A "waddling-type" gait pattern.  TODAY'S TREATMENT:                                     EXERCISE LOG  Exercise Repetitions and Resistance Comments  Nustep  Lvl 3 x 15 mins   LAQs 2# x 15 reps   Seated Marches 2# x 15 reps   Ball Squeeze X15 reps   Clams Red x 15 reps   Ham Curl Red x 15 reps    Blank cell = exercise not performed today   Manual Therapy Soft Tissue Mobilization: left quad, STW/M to left quad to decrease pain and tone with pt in supine for comfort with wedge    Modalities  Date:  Unattended Estim: Left quad, IFC 80-150 Hz, 15 mins, Pain Hot Pack: Left quad, 15 mins, Pain and Tone   ASSESSMENT:  CLINICAL IMPRESSION: Pt arrives for today's treatment session reporting "a little" left hip pain.  Upon further discussion, discovered that pt's pain concentrated in left quad.  Pt able to tolerate Nustep today for warmup without any discomfort.  Pt requires use of SPC and HHA +1 to ambulate throughout the gym.  Pt instructed in seated LLE exercises with cues and demonstration for proper technique.  Pt enjoys when PTA participates and counts reps with him.  STW/M performed to left quad to decrease pain and tone.  Normal responses to estim and MH noted upon removal.  Pt reported decreased left quad pain at completion of today's treatment session.   OBJECTIVE IMPAIRMENTS: Abnormal gait, decreased  activity tolerance, decreased ROM, decreased strength, and pain.   ACTIVITY LIMITATIONS: locomotion level  PERSONAL FACTORS: 1 comorbidity: mental deficiency   are also affecting patient's functional outcome.   REHAB POTENTIAL: Good  CLINICAL DECISION MAKING: Stable/uncomplicated  EVALUATION COMPLEXITY: Low   GOALS:  SHORT TERM GOALS: Target date: 08/03/22.  Ind with a HEP. Goal status: INITIAL  LONG TERM GOALS: Target date: 10/18/22  Perform a left SLR with pain not > 3/10.  Goal status: INITIAL  2.  Eliminate left LE symptoms.  Goal status: INITIAL  PLAN:  PT FREQUENCY: 1-2x/week  PT DURATION: 6 weeks  PLANNED INTERVENTIONS: Therapeutic  exercises, Therapeutic activity, Neuromuscular re-education, Gait training, Patient/Family education, Self Care, Electrical stimulation, Cryotherapy, Moist heat, Ultrasound, and Manual therapy  PLAN FOR NEXT SESSION: Nustep, therex, modalities and STW/M.   Newman Pies, PTA 07/24/2022, 10:42 AM

## 2022-07-26 ENCOUNTER — Ambulatory Visit: Payer: 59 | Admitting: Physical Therapy

## 2022-08-02 ENCOUNTER — Ambulatory Visit: Payer: 59

## 2022-08-02 DIAGNOSIS — M25552 Pain in left hip: Secondary | ICD-10-CM | POA: Diagnosis not present

## 2022-08-02 NOTE — Therapy (Signed)
OUTPATIENT PHYSICAL THERAPY LOWER EXTREMITY TREATMENT   Patient Name: Jamie Frost MRN: [id number] DOB:17-Oct-1975, 47 y.o., male Today's Date: 08/02/2022  END OF SESSION:  PT End of Session - 08/02/22 1124     Visit Number 3    Number of Visits 8    Date for PT Re-Evaluation 10/18/22    PT Start Time 1122    PT Stop Time 1207    PT Time Calculation (min) 45 min    Equipment Utilized During Treatment Gait belt    Activity Tolerance Patient tolerated treatment well    Behavior During Therapy WFL for tasks assessed/performed             Past Medical History:  Diagnosis Date   Allergic rhinitis, seasonal    Ambulates with cane    straight   Arthritis    Diabetes mellitus without complication (HCC)    type 2 - no meds   Hyperlipidemia    Hypertension    Impaired fasting glucose 12/09/2009   Obesity    Pulmonary hypoplasia    Right leg injury    wears brace LLE - per pt "foot turns over without brace"   Past Surgical History:  Procedure Laterality Date   ANKLE FUSION Left 08/09/2018   Procedure: LEFT TIBIO-CALCANEAL FUSION;  Surgeon: Nadara Mustard, MD;  Location: [facility] OR;  Service: Orthopedics;  Laterality: Left;   ANKLE FUSION Right 06/25/2020   Procedure: RIGHT TIBIOCALCANEAL FUSION;  Surgeon: Nadara Mustard, MD;  Location: [facility] OR;  Service: Orthopedics;  Laterality: Right;   bilateral inguinal hernia     COLONOSCOPY WITH PROPOFOL N/A 03/17/2021   Procedure: COLONOSCOPY WITH PROPOFOL;  Surgeon: Corbin Ade, MD;  [facility];  Service: Endoscopy;  Laterality: N/A;  10:15am   I/D abcess on back  2007   1 WEEK    right great toe surgery     Patient Active Problem List   Diagnosis Date Noted   Vitamin D deficiency 07/19/2022   Right hip pain 06/22/2022   Fall 06/22/2022   Urinary frequency 06/22/2022   Incontinence 06/22/2022   Hematuria 06/22/2022   Incontinence of feces 06/22/2022   Lumbar back pain with radiculopathy affecting left lower  extremity 04/04/2022   Encounter for screening colonoscopy 01/18/2021   Ankle instability, right    Acquired equinovarus deformity of left foot    Body mass index 40.0-44.9, adult (HCC) 07/16/2018   Spastic diplegic cerebral palsy (HCC) 07/16/2018   Equinus contracture of left ankle 07/16/2018   Cavovarus deformity of foot, acquired, left 07/16/2018   Recurrent falls while walking 07/10/2018   Prediabetes 08/12/2016   Pulmonary hypoplasia 09/02/2010   Generalized anxiety disorder 10/11/2008   Hyperlipidemia LDL goal <100 09/23/2007   Morbid obesity (HCC) 09/23/2007   ALLERGIC RHINITIS, SEASONAL 09/23/2007    REFERRING PROVIDER: Fuller Canada MD  REFERRING DIAG: Pain in left hip and DDD (lumbar).  THERAPY DIAG:  Pain in left hip  Rationale for Evaluation and Treatment: Rehabilitation  ONSET DATE: 04/02/22.  SUBJECTIVE:   SUBJECTIVE STATEMENT: Pt reports "a lot" of left quad pain today.  Pt ambulate into the facility with [facility] and father's assist.   PERTINENT HISTORY: See above, mental deficiency.   PAIN:  Are you having pain? Yes: NPRS scale: does not give a number/10 Pain location: See above. Pain description: "Hurts." Aggravating factors: Movement. Relieving factors: Not moving.  PRECAUTIONS: Fall.  Patient uses a cane.  Please use gait belt if walking.  WEIGHT  BEARING RESTRICTIONS: No  FALLS:  Has patient fallen in last 6 months? Yes. Number of falls 1.  LIVING ENVIRONMENT: Lives with: lives with their family Lives in: House/apartment Has following equipment at home: Single point cane  PLOF: Independent with basic ADLs and Independent with household mobility with device  PATIENT GOALS: Not have pain.   OBJECTIVE:   DIAGNOSTIC FINDINGS:   Mild degenerative changes at L4-L5 and L5-S1.  X-rays show normal femoral neck-shaft angle normal femoral head normal acetabulum   No evidence of arthritis or fracture   Impression normal pelvis and normal  hip  PALPATION: Patient reporting pain over his left buttock region and pain over left anterior thigh though not particularly tender to palpation.  He did not c/o of pain in his lumbar per palpation.  LOWER EXTREMITY ROM:  In supine he attempted to flexion his left hip but was in a great deal of pain.    LOWER EXTREMITY MMT:  He performed antigravity knee extension but was unable to raise his left LE against gravity.    GAIT: Patient walked in clinic with a straight cane with gait belt utilized.  He walked with essentially minimal hip motion and knees in extension.  A "waddling-type" gait pattern.  TODAY'S TREATMENT:                                     EXERCISE LOG  Exercise Repetitions and Resistance Comments  Nustep  Lvl 3 x 15 mins   LAQs 2# x 20 reps   Seated Marches 2# x 20 reps   Ball Squeeze X20 reps   Clams Red x 20 reps   Ham Curl Red x 20 reps    Blank cell = exercise not performed today   Manual Therapy Soft Tissue Mobilization: left quad, STW/M to left quad to decrease pain and tone with pt in supine for comfort with wedge    Modalities  Date:  Unattended Estim: Left quad, IFC 80-150 Hz, 15 mins, Pain Hot Pack: Left quad, 15 mins, Pain and Tone   ASSESSMENT:  CLINICAL IMPRESSION: Pt arrives for today's treatment session reporting "a lot" of left hip pain.  Pt able to tolerate increased reps with all exercises today.  Pt instructed in self manual therapy that he can perform to left quad as needed.  Pt got a crap in LLE while on wedge in supine, so wedge removed for comfort.  Normal responses to estim and MH noted upon removal.  Pt would benefit from [facility] stretch at next treatment session to tolerance.  Pt reports his leg feels better at completion of today's treatment session.  OBJECTIVE IMPAIRMENTS: Abnormal gait, decreased activity tolerance, decreased ROM, decreased strength, and pain.   ACTIVITY LIMITATIONS: locomotion level  PERSONAL FACTORS: 1  comorbidity: mental deficiency   are also affecting patient's functional outcome.   REHAB POTENTIAL: Good  CLINICAL DECISION MAKING: Stable/uncomplicated  EVALUATION COMPLEXITY: Low   GOALS:  SHORT TERM GOALS: Target date: 08/03/22.  Ind with a HEP. Goal status: INITIAL  LONG TERM GOALS: Target date: 10/18/22  Perform a left SLR with pain not > 3/10.  Goal status: INITIAL  2.  Eliminate left LE symptoms.  Goal status: INITIAL  PLAN:  PT FREQUENCY: 1-2x/week  PT DURATION: 6 weeks  PLANNED INTERVENTIONS: Therapeutic exercises, Therapeutic activity, Neuromuscular re-education, Gait training, Patient/Family education, Self Care, Electrical stimulation, Cryotherapy, Moist heat, Ultrasound, and Manual therapy  PLAN FOR NEXT SESSION: Nustep, therex, modalities and STW/M.   Newman Pies, PTA 08/02/2022, 12:12 PM

## 2022-08-07 ENCOUNTER — Encounter: Payer: Self-pay | Admitting: Physical Therapy

## 2022-08-07 ENCOUNTER — Ambulatory Visit: Payer: 59 | Attending: Orthopedic Surgery | Admitting: Physical Therapy

## 2022-08-07 DIAGNOSIS — M25552 Pain in left hip: Secondary | ICD-10-CM | POA: Diagnosis present

## 2022-08-07 NOTE — Therapy (Signed)
OUTPATIENT PHYSICAL THERAPY LOWER EXTREMITY TREATMENT   Patient Name: Jamie Frost MRN: [id number] DOB:[birth year], 47 y.o., male Today's Date: 08/07/2022  END OF SESSION:  PT End of Session - 08/07/22 1100     Visit Number 4    Number of Visits 8    Date for PT Re-Evaluation 10/18/22    PT Start Time 1102    PT Stop Time 1149    PT Time Calculation (min) 47 min    Equipment Utilized During Treatment Other (comment)   SPC   Activity Tolerance Patient tolerated treatment well    Behavior During Therapy WFL for tasks assessed/performed             Past Medical History:  Diagnosis Date   Allergic rhinitis, seasonal    Ambulates with cane    straight   Arthritis    Diabetes mellitus without complication (HCC)    type 2 - no meds   Hyperlipidemia    Hypertension    Impaired fasting glucose 12/09/2009   Obesity    Pulmonary hypoplasia    Right leg injury    wears brace LLE - per pt "foot turns over without brace"   Past Surgical History:  Procedure Laterality Date   ANKLE FUSION Left 08/09/2018   Procedure: LEFT TIBIO-CALCANEAL FUSION;  Surgeon: Nadara Mustard, MD;  Location: [facility] OR;  Service: Orthopedics;  Laterality: Left;   ANKLE FUSION Right 06/25/2020   Procedure: RIGHT TIBIOCALCANEAL FUSION;  Surgeon: Nadara Mustard, MD;  Location: [facility] OR;  Service: Orthopedics;  Laterality: Right;   bilateral inguinal hernia     COLONOSCOPY WITH PROPOFOL N/A 03/17/2021   Procedure: COLONOSCOPY WITH PROPOFOL;  Surgeon: Corbin Ade, MD;  Location: [facility];  Service: Endoscopy;  Laterality: N/A;  10:15am   I/D abcess on back  2007   1 WEEK    right great toe surgery     Patient Active Problem List   Diagnosis Date Noted   Vitamin D deficiency 07/19/2022   Right hip pain 06/22/2022   Fall 06/22/2022   Urinary frequency 06/22/2022   Incontinence 06/22/2022   Hematuria 06/22/2022   Incontinence of feces 06/22/2022   Lumbar back pain with radiculopathy affecting left  lower extremity 04/04/2022   Encounter for screening colonoscopy 01/18/2021   Ankle instability, right    Acquired equinovarus deformity of left foot    Body mass index 40.0-44.9, adult (HCC) 07/16/2018   Spastic diplegic cerebral palsy (HCC) 07/16/2018   Equinus contracture of left ankle 07/16/2018   Cavovarus deformity of foot, acquired, left 07/16/2018   Recurrent falls while walking 07/10/2018   Prediabetes 08/12/2016   Pulmonary hypoplasia 09/02/2010   Generalized anxiety disorder 10/11/2008   Hyperlipidemia LDL goal <100 09/23/2007   Morbid obesity (HCC) 09/23/2007   ALLERGIC RHINITIS, SEASONAL 09/23/2007    REFERRING PROVIDER: Fuller Canada MD  REFERRING DIAG: Pain in left hip and DDD (lumbar).  THERAPY DIAG:  Pain in left hip  Rationale for Evaluation and Treatment: Rehabilitation  ONSET DATE: 04/02/22.  SUBJECTIVE:   SUBJECTIVE STATEMENT: Ambulated into clinic with [facility] but still reporting L thigh tightness. No complaints of pain.  PERTINENT HISTORY: See above, mental deficiency.    PAIN:  Are you having pain? Yes: NPRS scale: does not give a number/10 Pain location: L thigh Pain description: "Hurts"/ tight Aggravating factors: Movement. Relieving factors: Not moving.  PRECAUTIONS: Fall.  Patient uses a cane.  Please use gait belt if walking.  WEIGHT BEARING RESTRICTIONS:  No  FALLS:  Has patient fallen in last 6 months? Yes. Number of falls 1.  LIVING ENVIRONMENT: Lives with: lives with their family Lives in: House/apartment Has following equipment at home: Single point cane  PLOF: Independent with basic ADLs and Independent with household mobility with device  PATIENT GOALS: Not have pain.  OBJECTIVE:   DIAGNOSTIC FINDINGS:   Mild degenerative changes at L4-L5 and L5-S1.  X-rays show normal femoral neck-shaft angle normal femoral head normal acetabulum   No evidence of arthritis or fracture   Impression normal pelvis and normal  hip  PALPATION: Patient reporting pain over his left buttock region and pain over left anterior thigh though not particularly tender to palpation.  He did not c/o of pain in his lumbar per palpation.  LOWER EXTREMITY ROM: In supine he attempted to flexion his left hip but was in a great deal of pain.    LOWER EXTREMITY MMT: He performed antigravity knee extension but was unable to raise his left LE against gravity.    GAIT: Patient walked in clinic with a straight cane with gait belt utilized.  He walked with essentially minimal hip motion and knees in extension.  A "waddling-type" gait pattern.  TODAY'S TREATMENT: 08/07/22   EXERCISE LOG  Exercise Repetitions and Resistance Comments  Nustep  Lvl 3 x 15 mins   LAQs 2# x 20 reps   Seated Marches 2# x 20 reps   [employer] X15 reps   Clams Red x 30 reps   Ham Curl Red x 30 reps    Blank cell = exercise not performed today   Manual Therapy Soft Tissue Mobilization: left quad, STW/M to left quad to decrease pain and tone in long sitting    Modalities  Date: 08/07/22 Unattended Estim: Left quad, IFC 80-150 Hz, 10 mins, Pain Hot Pack: Left quad, 10 mins, Pain and Tone   ASSESSMENT:  CLINICAL IMPRESSION: Patient presented in clinic with continued L quad tightness. Patient required supervision for all exercises and multimodal cueing to improve technique. Mild TP and muscle tightness palpable with manual therapy as well. CGA and supervision for all gait within clinic. Normal modalities response noted following removal of the modalities.  OBJECTIVE IMPAIRMENTS: Abnormal gait, decreased activity tolerance, decreased ROM, decreased strength, and pain.   ACTIVITY LIMITATIONS: locomotion level  PERSONAL FACTORS: 1 comorbidity: mental deficiency   are also affecting patient's functional outcome.   REHAB POTENTIAL: Good  CLINICAL DECISION MAKING: Stable/uncomplicated  EVALUATION COMPLEXITY: Low  GOALS:  SHORT TERM GOALS: Target  date: 08/03/22.  Ind with a HEP. Goal status: On-going  LONG TERM GOALS: Target date: 10/18/22  Perform a left SLR with pain not > 3/10.  Goal status: On-going  2.  Eliminate left LE symptoms.  Goal status: On-going  PLAN:  PT FREQUENCY: 1-2x/week  PT DURATION: 6 weeks  PLANNED INTERVENTIONS: Therapeutic exercises, Therapeutic activity, Neuromuscular re-education, Gait training, Patient/Family education, Self Care, Electrical stimulation, Cryotherapy, Moist heat, Ultrasound, and Manual therapy  PLAN FOR NEXT SESSION: Nustep, therex, modalities and STW/M.  Tyron Russell Tanetta Fuhriman, PTA 08/07/2022, 12:00 PM

## 2022-08-07 NOTE — Progress Notes (Unsigned)
History of Present Illness: Jamie Frost is a 47 y.o. male who presents today as a new patient at [facility].  He was referred by PCP for two problems: 1. Possible microscopic hematuria. - 06/16/2022: UA showed "moderate" blood; no urine microscopy performed. Urine culture was negative. 2. LUTS: Urinary frequency and incontinence.   Today: He {Actions; denies-reports:120008} dysuria. He urinates*** times per day. He {Actions; denies-reports:120008} urgency. He {Actions; denies-reports:120008} the need to strain to void. He {Actions; denies-reports:120008} sensations of incomplete emptying.  He {Actions; denies-reports:120008} urge incontinence. He {Actions; denies-reports:120008} stress incontinence with ***cough/***laugh/***sneeze/***heavy lifting /***exercise. He reports the ***SUI / ***UUI is predominant.  He leaks *** times per ***. Wears *** ***pads/ ***diapers per day. He reports the urinary incontinence {ACTION; IS/IS [id number]} significantly bothersome.   He {Actions; denies-reports:120008} abdominal pain. He {Actions; denies-reports:120008} flank pain. He {Actions; denies-reports:120008} fevers.  He {Actions; denies-reports:120008} prior history of gross hematuria.  He {Actions; denies-reports:120008} history of kidney stones.  He {Actions; denies-reports:120008} history of pyelonephritis.  He {Actions; denies-reports:120008} history of recent or recurrent UTI. He {Actions; denies-reports:120008} history of GU malignancy or pelvic radiation.  He {Actions; denies-reports:120008} history of autoimmune disease. He {Actions; denies-reports:120008} history of smoking (quit***; smoked*** ppd x*** years). He {Actions; denies-reports:120008} known occupational risks. He {Actions; denies-reports:120008} recent vigorous exercise which they think may be contributory to hematuria. He {Actions; denies-reports:120008} any recent trauma or prolonged pressure to the  perineal area. He {Actions; denies-reports:120008} recent illness. He {Actions; denies-reports:120008} taking anticoagulants (***). He {Actions; denies-reports:120008} family history of prostate cancer. His PSA was normal (0.8) on 08/30/2021.  ***He has neurogenic risk factors for OAB-type symptoms including cerebral palsy and lumbar radiculopathy.   Fall Screening: Do you usually have a device to assist in your mobility? {yes/no:20286} ***cane / ***walker / ***wheelchair  Medications: Current Outpatient Medications  Medication Sig Dispense Refill   albuterol (PROVENTIL) (2.5 MG/3ML) 0.083% nebulizer solution Take 3 mLs (2.5 mg total) by nebulization every 6 (six) hours as needed. One vial per nebulizer every 6 to 8 hours prn (Patient not taking: Reported on 06/30/2022) 75 mL 4   busPIRone (BUSPAR) 7.5 MG tablet TAKE 1 TABLET BY MOUTH TWICE  DAILY (Patient not taking: Reported on 06/30/2022) 200 tablet 2   Elite Nebulizer System MISC by Does not apply route. (Patient not taking: Reported on 06/30/2022)     ferrous sulfate 325 (65 FE) MG tablet Take 325 mg by mouth daily with breakfast. (Patient not taking: Reported on 06/30/2022)     fluticasone (FLONASE) 50 MCG/ACT nasal spray Place 2 sprays into both nostrils daily. (Patient not taking: Reported on 06/30/2022) 48 g 1   loratadine (CLARITIN) 10 MG tablet TAKE ONE (1) TABLET BY MOUTH EVERY DAY 90 tablet 1   oseltamivir (TAMIFLU) 75 MG capsule Take 1 capsule (75 mg total) by mouth 2 (two) times daily. 10 capsule 0   simvastatin (ZOCOR) 40 MG tablet Take 1 tablet (40 mg total) by mouth at bedtime. (Patient not taking: Reported on 06/30/2022) 90 tablet 1   UNABLE TO FIND Med Name: Cane x 1 Dx: J81.19 (Patient not taking: Reported on 06/30/2022) 1 each 0   Vitamin D, Ergocalciferol, (DRISDOL) 1.25 MG (50000 UNIT) CAPS capsule Take 1 capsule (50,000 Units total) by mouth every 7 (seven) days. 12 capsule 3   No current facility-administered medications  for this visit.    Allergies: Allergies  Allergen Reactions   Sulfonamide Derivatives Rash    On legs  Past Medical History:  Diagnosis Date   Allergic rhinitis, seasonal    Ambulates with cane    straight   Arthritis    Diabetes mellitus without complication (HCC)    type 2 - no meds   Hyperlipidemia    Hypertension    Impaired fasting glucose 12/09/2009   Obesity    Pulmonary hypoplasia    Right leg injury    wears brace LLE - per pt "foot turns over without brace"   Past Surgical History:  Procedure Laterality Date   ANKLE FUSION Left 08/09/2018   Procedure: LEFT TIBIO-CALCANEAL FUSION;  Surgeon: Nadara Mustard, MD;  Location: [facility] OR;  Service: Orthopedics;  Laterality: Left;   ANKLE FUSION Right 06/25/2020   Procedure: RIGHT TIBIOCALCANEAL FUSION;  Surgeon: Nadara Mustard, MD;  Location: [facility] OR;  Service: Orthopedics;  Laterality: Right;   bilateral inguinal hernia     COLONOSCOPY WITH PROPOFOL N/A 03/17/2021   Procedure: COLONOSCOPY WITH PROPOFOL;  Surgeon: Corbin Ade, MD;  Location: [facility];  Service: Endoscopy;  Laterality: N/A;  10:15am   I/D abcess on back  2007   1 WEEK    right great toe surgery      Family History  Problem Relation Age of Onset   Obesity Mother    Hypertension Mother    Arthritis Mother    Colon cancer Neg Hx    Colon polyps Neg Hx    Social History   Socioeconomic History   Marital status: Single    Spouse name: Not on file   Number of children: Not on file   Years of education: Not on file   Highest education level: Not on file  Occupational History   Not on file  Tobacco Use   Smoking status: Never   Smokeless tobacco: Never  Vaping Use   Vaping Use: Never used  Substance and Sexual Activity   Alcohol use: No   Drug use: No   Sexual activity: Not Currently  Other Topics Concern   Not on file  Social History Narrative   He enjoys reading the bible during his free time.    Social Determinants of Health    Financial Resource Strain: Low Risk  (02/14/2022)   Overall Financial Resource Strain (CARDIA)    Difficulty of Paying Living Expenses: Not hard at all  Food Insecurity: No Food Insecurity (02/14/2022)   Hunger Vital Sign    Worried About Running Out of Food in the Last Year: Never true    Ran Out of Food in the Last Year: Never true  Transportation Needs: No Transportation Needs (02/14/2022)   PRAPARE - Administrator, Civil Service (Medical): No    Lack of Transportation (Non-Medical): No  Physical Activity: Sufficiently Active (02/14/2022)   Exercise Vital Sign    Days of Exercise per Week: 5 days    Minutes of Exercise per Session: 40 min  Stress: No Stress Concern Present (02/14/2022)   [employer] of Occupational Health - Occupational Stress Questionnaire    Feeling of Stress : Not at all  Social Connections: Moderately Integrated (02/14/2022)   Social Connection and Isolation Panel [NHANES]    Frequency of Communication with Friends and Family: More than three times a week    Frequency of Social Gatherings with Friends and Family: More than three times a week    Attends Religious Services: More than 4 times per year    Active Member of Clubs or Organizations: No  Attends Banker Meetings: More than 4 times per year    Marital Status: Never married  Intimate Partner Violence: Not At Risk (02/14/2022)   Humiliation, Afraid, Rape, and Kick questionnaire    Fear of Current or Ex-Partner: No    Emotionally Abused: No    Physically Abused: No    Sexually Abused: No    SUBJECTIVE  Review of Systems Constitutional: Patient ***denies any unintentional weight loss or change in strength lntegumentary: Patient ***denies any rashes or pruritus Eyes: Patient denies ***dry eyes ENT: Patient ***denies dry mouth Cardiovascular: Patient ***denies chest pain or syncope Respiratory: Patient ***denies shortness of breath Gastrointestinal: Patient  ***denies nausea, vomiting, constipation, or diarrhea Musculoskeletal: Patient ***denies muscle cramps or weakness Neurologic: Patient ***denies convulsions or seizures Psychiatric: Patient ***denies memory problems Allergic/Immunologic: Patient ***denies recent allergic reaction(s) Hematologic/Lymphatic: Patient denies bleeding tendencies Endocrine: Patient ***denies heat/cold intolerance  GU: As per HPI.  OBJECTIVE There were no vitals filed for this visit. There is no height or weight on file to calculate BMI.  Physical Examination  Constitutional: ***No obvious distress; patient is ***non-toxic appearing  Cardiovascular: ***No visible lower extremity edema.  Respiratory: The patient does ***not have audible wheezing/stridor; respirations do ***not appear labored  Gastrointestinal: Abdomen ***non-distended Musculoskeletal: ***Normal ROM of UEs  Skin: ***No obvious rashes/open sores  Neurologic: CN 2-12 grossly ***intact Psychiatric: Answered questions ***appropriately with ***normal affect  Hematologic/Lymphatic/Immunologic: ***No obvious bruises or sites of spontaneous bleeding  UA: {Desc; negative/positive:13464} *** WBC/hpf, *** RBC/hpf, bacteria (***) *** nitrites, *** leukocytes, *** blood PVR: *** ml  ASSESSMENT No diagnosis found.  For asymptomatic microscopic hematuria we discussed possible etiologies including but not limited to: vigorous exercise, sexual activity, stone, trauma, blood thinner use, urinary tract infection, urethral irritation secondary to ***vaginal atrophy, chronic kidney disease, glomerulonephropathy, ***BPH, ***radiation cystitis, malignancy. ***We discussed pt's smoking as a risk factor for GU cancer and encouraged ***continued smoking cessation.***  We reviewed the AUA 2020 AMH guideline and risk stratification for this patient. Based on individual risk factors, pt was advised that the recommended workup includes ***repeat UA in 6 months / ***RUS /  ***CT urogram / ***cystoscopy. Pt decided to pursue this work-up and follow-up afterward to discuss the results and formulate a treatment plan based on the findings. All questions were answered.  *** AUA 2020 AMH guideline     ***if hematuria found to be r/t infection, UA should be repeated to ensure hematuria resolution after infection is treated  *** Likelihood of finding a clinically significant lesion = 13.8% with gross hematuria versus 3.1% with microscopic hematuria  *** If RBC casts or proteinuria seen on UA, consider nephrology referral to evaluate for glomerular disease   Will plan for follow up in ***months or sooner if needed. Pt verbalized understanding and agreement. All questions were answered.  PLAN Advised the following: *** ***No follow-ups on file.  No orders of the defined types were placed in this encounter.   It has been explained that the patient is to follow regularly with their PCP in addition to all other providers involved in their care and to follow instructions provided by these respective offices. Patient advised to contact urology clinic if any urologic-pertaining questions, concerns, new symptoms or problems arise in the interim period.  There are no Patient Instructions on file for this visit.  Electronically signed by:  Donnita Falls, MSN, FNP-C, CUNP 08/07/2022 9:34 PM

## 2022-08-08 ENCOUNTER — Ambulatory Visit (INDEPENDENT_AMBULATORY_CARE_PROVIDER_SITE_OTHER): Payer: 59 | Admitting: Urology

## 2022-08-08 ENCOUNTER — Encounter: Payer: Self-pay | Admitting: Urology

## 2022-08-08 VITALS — BP 124/81 | HR 76 | Temp 97.9°F

## 2022-08-08 DIAGNOSIS — R32 Unspecified urinary incontinence: Secondary | ICD-10-CM

## 2022-08-08 DIAGNOSIS — N3941 Urge incontinence: Secondary | ICD-10-CM

## 2022-08-08 DIAGNOSIS — R350 Frequency of micturition: Secondary | ICD-10-CM

## 2022-08-08 DIAGNOSIS — R319 Hematuria, unspecified: Secondary | ICD-10-CM

## 2022-08-08 DIAGNOSIS — G801 Spastic diplegic cerebral palsy: Secondary | ICD-10-CM

## 2022-08-08 DIAGNOSIS — M5416 Radiculopathy, lumbar region: Secondary | ICD-10-CM

## 2022-08-08 DIAGNOSIS — R3915 Urgency of urination: Secondary | ICD-10-CM

## 2022-08-08 LAB — MICROSCOPIC EXAMINATION
Bacteria, UA: NONE SEEN
Epithelial Cells (non renal): NONE SEEN /hpf (ref 0–10)
WBC, UA: NONE SEEN /hpf (ref 0–5)

## 2022-08-08 LAB — URINALYSIS, ROUTINE W REFLEX MICROSCOPIC
Bilirubin, UA: NEGATIVE
Glucose, UA: NEGATIVE
Ketones, UA: NEGATIVE
Leukocytes,UA: NEGATIVE
Nitrite, UA: NEGATIVE
Protein,UA: NEGATIVE
Specific Gravity, UA: 1.02 (ref 1.005–1.030)
Urobilinogen, Ur: 1 mg/dL (ref 0.2–1.0)
pH, UA: 7 (ref 5.0–7.5)

## 2022-08-08 LAB — BLADDER SCAN AMB NON-IMAGING

## 2022-08-08 NOTE — Patient Instructions (Signed)
Overactive bladder (OAB) overview for patients:  Symptoms may include: urinary urgency ("gotta go" feeling) urinary frequency (voiding >8 times per day) night time urination (nocturia) urge incontinence of urine (UUI)  While we do not know the exact etiology of OAB, several treatment options exist including:  Behavioral therapy: Reducing fluid intake Decreasing bladder stimulants (such as caffeine) and irritants (such as acidic food, spicy foods, alcohol) Urge suppression strategies Bladder retraining via timed voiding Pelvic floor physical therapy  Medication(s) - can use one or both of the drug classes below. Anticholinergic / antimuscarinic medications:  Mechanism of action: Activate M3 receptors to reduce detrusor stimulation and increase bladder capacity  (parasympathetic nervous system). Effect: Relaxes the bladder to decrease overactivity, increase bladder storage capacity, and increase time between voids. Onset: Slow acting (may take 8-12 weeks to determine efficacy). Medications include: Vesicare (Solifenacin), Ditropan (Oxybutynin), Detrol (Tolterodine), Toviaz (Fesoterodine), Sanctura (Trospium), Urispas (Flavoxate), Enablex (Darifenacin), Bentyl (Dicyclomine), Levsin (Hyoscyamine ). Potential side effects include but are not limited to: Dry eyes, dry mouth, constipation, cognitive impairment, dementia risk with long term use, and urinary retention/ incomplete bladder emptying. Insurance companies generally prefer for patients to try 1-2 anticholinergic / antimuscarinic medications first due to low cost. Some exceptions are made based on patient-specific comorbidities / risk factors. Beta-3 agonist medications: Mechanism of action: Stimulates selective B3 adrenergic receptors to cause smooth muscle bladder relaxation (sympathetic nervous system). Effect: Relaxes the bladder to decrease overactivity, increase bladder storage capacity, and increase time between voids. Onset:  Slow acting (may take 8-12 weeks to determine efficacy). Medications include: Myrbetriq (Mirabegron) and Vibegron (Gemtesa). Potential side effects include but are not limited to: urinary retention / incomplete bladder emptying and elevated blood pressure (more likely to occur in individuals with pre-existing uncontrolled hypertension). These medications tend to be more expensive than the anticholinergic / antimuscarinic medications.   For patients with refractory OAB (if the above treatment options have been unsuccessful): Posterior tibial nerve stimulation (PTNS). Small acupuncture-type needle inserted near ankle with electric current to stimulate bladder via posterior tibial nerve pathway. Initially requires 12 weekly in-office treatments lasting 30 minutes each; followed by monthly in-office treatments lasting 30 minutes each for 1 year.  Bladder Botox injections. How it is done: Typically done via in-office cystoscopy; sometimes done in the OR depending on the situation. The bladder is numbed with lidocaine instilled via a catheter. Then the urologist injects Botox into the bladder muscle wall in about 20 locations. Causes local paralysis of the bladder muscle at the injection sites to reduce bladder muscle overactivity / spasms. The effect lasts for approximately 6 months and cannot be reversed once performed. Risks may included but are not limited to: infection, incomplete bladder emptying/ urinary retention, short term need for self-catheterization or indwelling catheter, and need for repeat therapy. There is a 5-12% chance of needing to catheterize with Botox - that usually resolves in a few months as the Botox wears off. Typically Botox injections would need to be repeated every 3-12 months since this is not a permanent therapy.  Sacral neuromodulation trial (Medtronic lnterStim or Axonics implant). Sacral neuromodulation is FDA-approved for uncontrolled urinary urgency, urinary frequency,  urinary urge incontinence, non-obstructive urinary retention, or fecal incontinence. It is not FDA-approved as a treatment for pain. The goal of this therapy is at least a 50% improvement in symptoms. It is NOT realistic to expect a 100% cure. This is a a 2-step outpatient procedure. After a successful test period, a permanent wire and generator are placed   in the OR. We discussed the risk of infection. We reviewed the fact that about 30% of patients fail the test phase and are not candidates for permanent generator placement. During the 1-2 week trial phase, symptoms are documented by the patient to determine response. If patient gets at least a 50% improvement in symptoms, they may then proceed with Step 2. Step 1: Trial lead placement. Per physician discretion, may done one of two ways: Percutaneous nerve evaluation (PNE) in the [facility] office. Performed by urologist under local anesthesia (numbing the area with lidocaine) using a spinal needle for placement of test wire, which usually stays in place for 5-7 days to determine therapy response. Test lead placement in OR under anesthesia. Usually stays in place 2 weeks to determine therapy response. > Step 2: Permanent implantation of sacral neuromodulation device, which is performed in the OR.  Sacral neuromodulation implants: All are conditionally MRI safe. Manufacturer: Medtronic Website: www.medtronic.com/[place]/patients/treatments-therapies/neurostimulator-overactive-bladder/getting therapy/right-for-you.html Options: lnterStim X: Non-rechargeable. The battery lasts 10 years on average. lnterStim Micro: Rechargeable. The battery lasts 15 years on average and must be charged routinely. Approximately 50% smaller implant than lnterStim X implant.  Manufacturer: Axonics Website: Findrealrelief.axonics.com Options: Non-rechargeable (Axonics F15): The battery lasts 15 years on average. Rechargeable (Axonics R20): The battery lasts 20 years on  average and must be charged in office for about 1 hour every 6-10 months on average. Approximately 50% smaller implant than Axonics non-rechargeable implant.  Note: Generally the rechargeable devices are only advised for very small or thin patients who may not have sufficient adipose tissue to comfortably overlay the implanted device.  Suprapubic catheter (SP tube) placement. Only done in severely refractory OAB when all other options have failed or are not a viable treatment choice depending on patient factors. Involves placement of a catheter through the lower abdomen into the bladder to continuously drain the bladder into an external collection bag, which patient can then empty at their convenience every few hours. Done via an outpatient surgical procedure in the OR under anesthesia. Risks may included but are not limited to: surgical site pain, infections, skin irritation / breakdown, chronic bacteriuria, symptomatic UTls. The SP tube must stay in place continuously. This is a reversible procedure however - the insertion site will close if catheter is removed for more than a few hours. The SP tube must be exchanged routinely every 4 weeks to prevent the catheter from becoming clogged with sediment. SP tube exchanges are typically performed at a urology nurse visit or by a home health nurse.  

## 2022-08-14 ENCOUNTER — Encounter: Payer: Self-pay | Admitting: Family Medicine

## 2022-08-14 ENCOUNTER — Ambulatory Visit: Payer: 59

## 2022-08-14 DIAGNOSIS — M25552 Pain in left hip: Secondary | ICD-10-CM

## 2022-08-14 NOTE — Therapy (Signed)
OUTPATIENT PHYSICAL THERAPY LOWER EXTREMITY TREATMENT   Patient Name: Jamie Frost MRN: [id number] DOB:[birth year], 47 y.o., male Today's Date: 08/14/2022  END OF SESSION:  PT End of Session - 08/14/22 1104     Visit Number 5    Number of Visits 8    Date for PT Re-Evaluation 10/18/22    PT Start Time 1100    PT Stop Time 1156    PT Time Calculation (min) 56 min    Equipment Utilized During Treatment Other (comment)   SPC   Activity Tolerance Patient tolerated treatment well    Behavior During Therapy WFL for tasks assessed/performed             Past Medical History:  Diagnosis Date   Allergic rhinitis, seasonal    Ambulates with cane    straight   Arthritis    Diabetes mellitus without complication (HCC)    type 2 - no meds   Hyperlipidemia    Hypertension    Impaired fasting glucose 12/09/2009   Obesity    Pulmonary hypoplasia    Right leg injury    wears brace LLE - per pt "foot turns over without brace"   Past Surgical History:  Procedure Laterality Date   ANKLE FUSION Left 08/09/2018   Procedure: LEFT TIBIO-CALCANEAL FUSION;  Surgeon: Nadara Mustard, MD;  Location: [facility] OR;  Service: Orthopedics;  Laterality: Left;   ANKLE FUSION Right 06/25/2020   Procedure: RIGHT TIBIOCALCANEAL FUSION;  Surgeon: Nadara Mustard, MD;  Location: [facility] OR;  Service: Orthopedics;  Laterality: Right;   bilateral inguinal hernia     COLONOSCOPY WITH PROPOFOL N/A 03/17/2021   Procedure: COLONOSCOPY WITH PROPOFOL;  Surgeon: Corbin Ade, MD;  Location: [facility];  Service: Endoscopy;  Laterality: N/A;  10:15am   I/D abcess on back  2007   1 WEEK    right great toe surgery     Patient Active Problem List   Diagnosis Date Noted   Vitamin D deficiency 07/19/2022   Right hip pain 06/22/2022   Fall 06/22/2022   Urinary frequency 06/22/2022   Incontinence 06/22/2022   Hematuria 06/22/2022   Incontinence of feces 06/22/2022   Lumbar back pain with radiculopathy affecting left  lower extremity 04/04/2022   Encounter for screening colonoscopy 01/18/2021   Ankle instability, right    Acquired equinovarus deformity of left foot    Body mass index 40.0-44.9, adult (HCC) 07/16/2018   Spastic diplegic cerebral palsy (HCC) 07/16/2018   Equinus contracture of left ankle 07/16/2018   Cavovarus deformity of foot, acquired, left 07/16/2018   Recurrent falls while walking 07/10/2018   Prediabetes 08/12/2016   Pulmonary hypoplasia 09/02/2010   Generalized anxiety disorder 10/11/2008   Hyperlipidemia LDL goal <100 09/23/2007   Morbid obesity (HCC) 09/23/2007   ALLERGIC RHINITIS, SEASONAL 09/23/2007    REFERRING PROVIDER: Fuller Canada MD  REFERRING DIAG: Pain in left hip and DDD (lumbar).  THERAPY DIAG:  Pain in left hip  Rationale for Evaluation and Treatment: Rehabilitation  ONSET DATE: 04/02/22.  SUBJECTIVE:   SUBJECTIVE STATEMENT: Pt denies any pain today, but does report that left leg is "tired."  PERTINENT HISTORY: See above, mental deficiency.    PAIN:  Are you having pain? No  PRECAUTIONS: Fall.  Patient uses a cane.  Please use gait belt if walking.  WEIGHT BEARING RESTRICTIONS: No  FALLS:  Has patient fallen in last 6 months? Yes. Number of falls 1.  LIVING ENVIRONMENT: Lives with: lives with their  family Lives in: House/apartment Has following equipment at home: Single point cane  PLOF: Independent with basic ADLs and Independent with household mobility with device  PATIENT GOALS: Not have pain.  OBJECTIVE:   DIAGNOSTIC FINDINGS:   Mild degenerative changes at L4-L5 and L5-S1.  X-rays show normal femoral neck-shaft angle normal femoral head normal acetabulum   No evidence of arthritis or fracture   Impression normal pelvis and normal hip  PALPATION: Patient reporting pain over his left buttock region and pain over left anterior thigh though not particularly tender to palpation.  He did not c/o of pain in his lumbar per  palpation.  LOWER EXTREMITY ROM: In supine he attempted to flexion his left hip but was in a great deal of pain.    LOWER EXTREMITY MMT: He performed antigravity knee extension but was unable to raise his left LE against gravity.    GAIT: Patient walked in clinic with a straight cane with gait belt utilized.  He walked with essentially minimal hip motion and knees in extension.  A "waddling-type" gait pattern.  TODAY'S TREATMENT: 08/14/22   EXERCISE LOG  Exercise Repetitions and Resistance Comments  Nustep  Lvl 3 x 15 mins   LAQs 2# x 25 reps   Seated Marches 2# x 25 reps   Ball Squeeze X20 reps   Clams Red x 30 reps   Ham Curl Red x 30 reps    Blank cell = exercise not performed today   Manual Therapy Soft Tissue Mobilization: left quad, STW/M to left quad to decrease pain and tone in long sitting    Modalities  Date: 08/14/22 Unattended Estim: Left quad, IFC 80-150 Hz, 10 mins, Pain Hot Pack: Left quad, 10 mins, Pain and Tone   ASSESSMENT:  CLINICAL IMPRESSION: Pt arrives for today's treatment session denying any pain, but does report RLE fatigue due to performing exercises at home. Pt able to tolerate increased reps with all exercises today with min cues to stay on task.  Normal responses to estim and MH noted upon removal.  Pt denied any pain at completion of today's treatment session.  OBJECTIVE IMPAIRMENTS: Abnormal gait, decreased activity tolerance, decreased ROM, decreased strength, and pain.   ACTIVITY LIMITATIONS: locomotion level  PERSONAL FACTORS: 1 comorbidity: mental deficiency   are also affecting patient's functional outcome.   REHAB POTENTIAL: Good  CLINICAL DECISION MAKING: Stable/uncomplicated  EVALUATION COMPLEXITY: Low  GOALS:  SHORT TERM GOALS: Target date: 08/03/22.  Ind with a HEP. Goal status: On-going  LONG TERM GOALS: Target date: 10/18/22  Perform a left SLR with pain not > 3/10.  Goal status: On-going  2.  Eliminate left LE  symptoms.  Goal status: On-going  PLAN:  PT FREQUENCY: 1-2x/week  PT DURATION: 6 weeks  PLANNED INTERVENTIONS: Therapeutic exercises, Therapeutic activity, Neuromuscular re-education, Gait training, Patient/Family education, Self Care, Electrical stimulation, Cryotherapy, Moist heat, Ultrasound, and Manual therapy  PLAN FOR NEXT SESSION: Nustep, therex, modalities and STW/M.  Newman Pies, PTA 08/14/2022, 11:56 AM

## 2022-08-15 ENCOUNTER — Ambulatory Visit (INDEPENDENT_AMBULATORY_CARE_PROVIDER_SITE_OTHER): Payer: 59 | Admitting: Family Medicine

## 2022-08-15 ENCOUNTER — Encounter: Payer: Self-pay | Admitting: Family Medicine

## 2022-08-15 ENCOUNTER — Other Ambulatory Visit: Payer: Self-pay

## 2022-08-15 VITALS — BP 126/80 | HR 80 | Ht 67.0 in | Wt 256.1 lb

## 2022-08-15 DIAGNOSIS — B350 Tinea barbae and tinea capitis: Secondary | ICD-10-CM

## 2022-08-15 DIAGNOSIS — B9689 Other specified bacterial agents as the cause of diseases classified elsewhere: Secondary | ICD-10-CM

## 2022-08-15 DIAGNOSIS — R7303 Prediabetes: Secondary | ICD-10-CM

## 2022-08-15 DIAGNOSIS — L089 Local infection of the skin and subcutaneous tissue, unspecified: Secondary | ICD-10-CM | POA: Diagnosis not present

## 2022-08-15 DIAGNOSIS — E785 Hyperlipidemia, unspecified: Secondary | ICD-10-CM

## 2022-08-15 DIAGNOSIS — R296 Repeated falls: Secondary | ICD-10-CM

## 2022-08-15 DIAGNOSIS — Z125 Encounter for screening for malignant neoplasm of prostate: Secondary | ICD-10-CM

## 2022-08-15 MED ORDER — CEPHALEXIN 500 MG PO CAPS: 500.0000 mg | ORAL_CAPSULE | Freq: Two times a day (BID) | ORAL | 0 refills | Status: DC

## 2022-08-15 MED ORDER — TERBINAFINE HCL 250 MG PO TABS: 250.0000 mg | ORAL_TABLET | Freq: Every day | ORAL | 0 refills | Status: DC

## 2022-08-15 MED ORDER — ACETAMINOPHEN 500 MG PO TABS: ORAL_TABLET | ORAL | 5 refills | Status: DC

## 2022-08-15 MED ORDER — KETOCONAZOLE 2 % EX SHAM: 1.0000 | MEDICATED_SHAMPOO | CUTANEOUS | 1 refills | Status: DC

## 2022-08-15 NOTE — Patient Instructions (Addendum)
Annual exam end August, call if you need m sooner  Fasting lipid, cmp and eGFr and hBA1C and PSA mid July ( order will be mailed to you)  CONGRATS on weight loss  Thankful pain is less  Shampoo and tablets are prescribed for fungal infection of scalp with abrasion  Thanks for choosing [facility], we consider it a privelige to serve you.

## 2022-08-16 ENCOUNTER — Encounter: Payer: Self-pay | Admitting: Physical Therapy

## 2022-08-16 ENCOUNTER — Ambulatory Visit: Payer: 59 | Admitting: Physical Therapy

## 2022-08-16 DIAGNOSIS — M25552 Pain in left hip: Secondary | ICD-10-CM | POA: Diagnosis not present

## 2022-08-16 NOTE — Therapy (Signed)
OUTPATIENT PHYSICAL THERAPY LOWER EXTREMITY TREATMENT   Patient Name: Jamie Frost MRN: [id number] DOB:02-Jun-1975, 47 y.o., male Today's Date: 08/16/2022  END OF SESSION:  PT End of Session - 08/16/22 0934     Visit Number 6    Number of Visits 8    Date for PT Re-Evaluation 10/18/22    PT Start Time 0932    PT Stop Time 1027    PT Time Calculation (min) 55 min    Equipment Utilized During Treatment Other (comment)   SPC   Activity Tolerance Patient tolerated treatment well    Behavior During Therapy WFL for tasks assessed/performed            Past Medical History:  Diagnosis Date   Allergic rhinitis, seasonal    Ambulates with cane    straight   Arthritis    Diabetes mellitus without complication (HCC)    type 2 - no meds   Hyperlipidemia    Hypertension    Impaired fasting glucose 12/09/2009   Obesity    Pulmonary hypoplasia    Right leg injury    wears brace LLE - per pt "foot turns over without brace"   Past Surgical History:  Procedure Laterality Date   ANKLE FUSION Left 08/09/2018   Procedure: LEFT TIBIO-CALCANEAL FUSION;  Surgeon: Nadara Mustard, MD;  Location: [facility] OR;  Service: Orthopedics;  Laterality: Left;   ANKLE FUSION Right 06/25/2020   Procedure: RIGHT TIBIOCALCANEAL FUSION;  Surgeon: Nadara Mustard, MD;  Location: [facility] OR;  Service: Orthopedics;  Laterality: Right;   bilateral inguinal hernia     COLONOSCOPY WITH PROPOFOL N/A 03/17/2021   Procedure: COLONOSCOPY WITH PROPOFOL;  Surgeon: Corbin Ade, MD;  Location: [facility];  Service: Endoscopy;  Laterality: N/A;  10:15am   I/D abcess on back  2007   1 WEEK    right great toe surgery     Patient Active Problem List   Diagnosis Date Noted   Vitamin D deficiency 07/19/2022   Right hip pain 06/22/2022   Fall 06/22/2022   Urinary frequency 06/22/2022   Incontinence 06/22/2022   Hematuria 06/22/2022   Incontinence of feces 06/22/2022   Lumbar back pain with radiculopathy affecting left  lower extremity 04/04/2022   Encounter for screening colonoscopy 01/18/2021   Ankle instability, right    Acquired equinovarus deformity of left foot    Body mass index 40.0-44.9, adult (HCC) 07/16/2018   Spastic diplegic cerebral palsy (HCC) 07/16/2018   Equinus contracture of left ankle 07/16/2018   Cavovarus deformity of foot, acquired, left 07/16/2018   Recurrent falls while walking 07/10/2018   Prediabetes 08/12/2016   Pulmonary hypoplasia 09/02/2010   Generalized anxiety disorder 10/11/2008   Hyperlipidemia LDL goal <100 09/23/2007   Morbid obesity (HCC) 09/23/2007   ALLERGIC RHINITIS, SEASONAL 09/23/2007   REFERRING PROVIDER: Fuller Canada MD  REFERRING DIAG: Pain in left hip and DDD (lumbar).  THERAPY DIAG:  Pain in left hip  Rationale for Evaluation and Treatment: Rehabilitation  ONSET DATE: 04/02/22.  SUBJECTIVE:   SUBJECTIVE STATEMENT: Pt denies any pain today.  PERTINENT HISTORY: See above, mental deficiency.    PAIN:  Are you having pain? No  PRECAUTIONS: Fall.  Patient uses a cane.  Please use gait belt if walking.  WEIGHT BEARING RESTRICTIONS: No  FALLS:  Has patient fallen in last 6 months? Yes. Number of falls 1.  LIVING ENVIRONMENT: Lives with: lives with their family Lives in: House/apartment Has following equipment at home: Single  point cane  PLOF: Independent with basic ADLs and Independent with household mobility with device  PATIENT GOALS: Not have pain.  OBJECTIVE:   DIAGNOSTIC FINDINGS:   Mild degenerative changes at L4-L5 and L5-S1.  X-rays show normal femoral neck-shaft angle normal femoral head normal acetabulum   No evidence of arthritis or fracture   Impression normal pelvis and normal hip  PALPATION: Patient reporting pain over his left buttock region and pain over left anterior thigh though not particularly tender to palpation.  He did not c/o of pain in his lumbar per palpation.  LOWER EXTREMITY ROM: In supine he  attempted to flexion his left hip but was in a great deal of pain.    LOWER EXTREMITY MMT: He performed antigravity knee extension but was unable to raise his left LE against gravity.    GAIT: Patient walked in clinic with a straight cane with gait belt utilized.  He walked with essentially minimal hip motion and knees in extension.  A "waddling-type" gait pattern.  TODAY'S TREATMENT: 08/16/22   EXERCISE LOG  Exercise Repetitions and Resistance Comments  Bike Lvl 1 x 16 mins   LAQs 3# x 20 reps   Standing marching LLE x10 reps   Standing hip abduction LLE x20 reps   Heel raises X10 reps   Ball Squeeze X2 min   Clams Red x 30 reps   Ham Curl Red x 30 reps   Supine hip flexor stretch 3 x 30 sec    Blank cell = exercise not performed today   Manual Therapy Soft Tissue Mobilization: L quad, to reduce tone     Modalities  Date: 08/16/22 Unattended Estim: Left quad, IFC 80-150 Hz, 10 mins, Pain Hot Pack: Left quad, 10 mins, Pain and Tone   ASSESSMENT:  CLINICAL IMPRESSION: Patient arrived in clinic with reports of no L thigh pain. Patient did indicate later in the treatment of continued tightness of L quad region. More standing exercises attempted today with constant multimodal cueing required to continue proper technique and maintain focus. Patient introduced to supine hip flexor stretch off EOB and patient indicated stretch session. Patient also presented with increased muscle tightness of L lateral quad region and reported tenderness with manual therapy. Normal modalities response noted following removal of the modalities.  OBJECTIVE IMPAIRMENTS: Abnormal gait, decreased activity tolerance, decreased ROM, decreased strength, and pain.   ACTIVITY LIMITATIONS: locomotion level  PERSONAL FACTORS: 1 comorbidity: mental deficiency   are also affecting patient's functional outcome.   REHAB POTENTIAL: Good  CLINICAL DECISION MAKING: Stable/uncomplicated  EVALUATION COMPLEXITY:  Low  GOALS:  SHORT TERM GOALS: Target date: 08/03/22.  Ind with a HEP. Goal status: On-going  LONG TERM GOALS: Target date: 10/18/22  Perform a left SLR with pain not > 3/10.  Goal status: On-going  2.  Eliminate left LE symptoms.  Goal status: On-going  PLAN:  PT FREQUENCY: 1-2x/week  PT DURATION: 6 weeks  PLANNED INTERVENTIONS: Therapeutic exercises, Therapeutic activity, Neuromuscular re-education, Gait training, Patient/Family education, Self Care, Electrical stimulation, Cryotherapy, Moist heat, Ultrasound, and Manual therapy  PLAN FOR NEXT SESSION: Nustep, therex, modalities and STW/M.  Marvell Fuller, PTA 08/16/2022, 10:36 AM

## 2022-08-21 ENCOUNTER — Encounter: Payer: Self-pay | Admitting: Physical Therapy

## 2022-08-21 ENCOUNTER — Ambulatory Visit: Payer: 59 | Admitting: Physical Therapy

## 2022-08-21 DIAGNOSIS — M25552 Pain in left hip: Secondary | ICD-10-CM

## 2022-08-21 NOTE — Therapy (Signed)
OUTPATIENT PHYSICAL THERAPY LOWER EXTREMITY TREATMENT   Patient Name: RUSELL CHOAT MRN: [id number] DOB:02/19/1976, 47 y.o., male Today's Date: 08/21/2022  END OF SESSION:  PT End of Session - 08/21/22 1052     Visit Number 7    Number of Visits 8    Date for PT Re-Evaluation 10/18/22    PT Start Time 1052    PT Stop Time 1152    PT Time Calculation (min) 60 min    Equipment Utilized During Treatment Other (comment)   SPC   Activity Tolerance Patient tolerated treatment well    Behavior During Therapy WFL for tasks assessed/performed            Past Medical History:  Diagnosis Date   Allergic rhinitis, seasonal    Ambulates with cane    straight   Arthritis    Diabetes mellitus without complication (HCC)    type 2 - no meds   Hyperlipidemia    Hypertension    Impaired fasting glucose 12/09/2009   Obesity    Pulmonary hypoplasia    Right leg injury    wears brace LLE - per pt "foot turns over without brace"   Past Surgical History:  Procedure Laterality Date   ANKLE FUSION Left 08/09/2018   Procedure: LEFT TIBIO-CALCANEAL FUSION;  Surgeon: Nadara Mustard, MD;  Location: [facility] OR;  Service: Orthopedics;  Laterality: Left;   ANKLE FUSION Right 06/25/2020   Procedure: RIGHT TIBIOCALCANEAL FUSION;  Surgeon: Nadara Mustard, MD;  Location: [facility] OR;  Service: Orthopedics;  Laterality: Right;   bilateral inguinal hernia     COLONOSCOPY WITH PROPOFOL N/A 03/17/2021   Procedure: COLONOSCOPY WITH PROPOFOL;  Surgeon: Corbin Ade, MD;  Location: [facility];  Service: Endoscopy;  Laterality: N/A;  10:15am   I/D abcess on back  2007   1 WEEK    right great toe surgery     Patient Active Problem List   Diagnosis Date Noted   Vitamin D deficiency 07/19/2022   Right hip pain 06/22/2022   Fall 06/22/2022   Urinary frequency 06/22/2022   Incontinence 06/22/2022   Hematuria 06/22/2022   Incontinence of feces 06/22/2022   Lumbar back pain with radiculopathy affecting left  lower extremity 04/04/2022   Encounter for screening colonoscopy 01/18/2021   Ankle instability, right    Acquired equinovarus deformity of left foot    Body mass index 40.0-44.9, adult (HCC) 07/16/2018   Spastic diplegic cerebral palsy (HCC) 07/16/2018   Equinus contracture of left ankle 07/16/2018   Cavovarus deformity of foot, acquired, left 07/16/2018   Recurrent falls while walking 07/10/2018   Prediabetes 08/12/2016   Pulmonary hypoplasia 09/02/2010   Generalized anxiety disorder 10/11/2008   Hyperlipidemia LDL goal <100 09/23/2007   Morbid obesity (HCC) 09/23/2007   ALLERGIC RHINITIS, SEASONAL 09/23/2007   REFERRING PROVIDER: Fuller Canada MD  REFERRING DIAG: Pain in left hip and DDD (lumbar).  THERAPY DIAG:  Pain in left hip  Rationale for Evaluation and Treatment: Rehabilitation  ONSET DATE: 04/02/22.  SUBJECTIVE:   SUBJECTIVE STATEMENT: Pt denies any pain today. No new complaints via Father.  PERTINENT HISTORY: See above, mental deficiency.    PAIN:  Are you having pain? No  PRECAUTIONS: Fall.  Patient uses a cane.  Please use gait belt if walking.  WEIGHT BEARING RESTRICTIONS: No  FALLS:  Has patient fallen in last 6 months? Yes. Number of falls 1.  LIVING ENVIRONMENT: Lives with: lives with their family Lives in: House/apartment Has  following equipment at home: Single point cane  PLOF: Independent with basic ADLs and Independent with household mobility with device  PATIENT GOALS: Not have pain.  OBJECTIVE:   DIAGNOSTIC FINDINGS:   Mild degenerative changes at L4-L5 and L5-S1.  X-rays show normal femoral neck-shaft angle normal femoral head normal acetabulum   No evidence of arthritis or fracture   Impression normal pelvis and normal hip  PALPATION: Patient reporting pain over his left buttock region and pain over left anterior thigh though not particularly tender to palpation.  He did not c/o of pain in his lumbar per  palpation.  LOWER EXTREMITY ROM: In supine he attempted to flexion his left hip but was in a great deal of pain.    LOWER EXTREMITY MMT: He performed antigravity knee extension but was unable to raise his left LE against gravity.    GAIT: Patient walked in clinic with a straight cane with gait belt utilized.  He walked with essentially minimal hip motion and knees in extension.  A "waddling-type" gait pattern.  TODAY'S TREATMENT: 08/21/22   EXERCISE LOG  Exercise Repetitions and Resistance Comments  Nustep Lvl 3 x 15 mins   Lunges 6" step x20 reps for stretch BLE   LAQs 4# x 20 reps   Standing marching BLE x20 reps   Standing hip abduction BLE x10 reps   Clams Red x 30 reps   Ham Curl Green x 20 reps   Supine hip flexor stretch 3 x 30 sec PT notable in mid L quad  LLE SLR Attempted but weak and reported tightness of L quad    Blank cell = exercise not performed today   Manual Therapy Soft Tissue Mobilization: L quad, to reduce tone and IASTW utilized lightly for 2 minutes     Modalities  Date: 08/21/22 Unattended Estim: Left quad, IFC 80-150 Hz, 15 mins, Pain Hot Pack: Left quad, 15 mins, Pain and Tone  *Patient requested no LE wedge or bolster   ASSESSMENT:  CLINICAL IMPRESSION: Patient presented in clinic with reports of no LLE but still has muscle tightness indicated of the L quad. Patient progressed to more standing exercises with VC to correct technique. Patient also progressed through more LE stretching functionally with lunges. Supine hip flexor stretch off EOB continued with mild overpressure for knee flexion with patient indicating an stretching sensation. Moderate muscle tightness and TP noted in L mid quad today. Patient advised that short duration IASTW session to be attempted to help aid in reduction of L quad tightness and that soreness may present. Patient states that he also has exercises he does at home outside of PT. Patient observed with RLE ER, minimal hip  flexion and step to gait pattern with AD. Patient's goal assessed during treatment with minimal goal progress due to muscle tightness and limitation with SLR. Normal modalities response noted following removal of the modalities. Patient requested no LE wedge or bolster during modalities.  OBJECTIVE IMPAIRMENTS: Abnormal gait, decreased activity tolerance, decreased ROM, decreased strength, and pain.   ACTIVITY LIMITATIONS: locomotion level  PERSONAL FACTORS: 1 comorbidity: mental deficiency   are also affecting patient's functional outcome.   REHAB POTENTIAL: Good  CLINICAL DECISION MAKING: Stable/uncomplicated  EVALUATION COMPLEXITY: Low  GOALS:  SHORT TERM GOALS: Target date: 08/03/22.  Ind with a HEP. Goal status: On-going  LONG TERM GOALS: Target date: 10/18/22  Perform a left SLR with pain not > 3/10.  Goal status: On-going  2.  Eliminate left LE symptoms.  Goal  status: On-going  PLAN:  PT FREQUENCY: 1-2x/week  PT DURATION: 6 weeks  PLANNED INTERVENTIONS: Therapeutic exercises, Therapeutic activity, Neuromuscular re-education, Gait training, Patient/Family education, Self Care, Electrical stimulation, Cryotherapy, Moist heat, Ultrasound, and Manual therapy  PLAN FOR NEXT SESSION: Nustep, therex, modalities and STW/M.  Tyron Russell Lang Zingg, PTA 08/21/2022, 11:59 AM

## 2022-08-22 ENCOUNTER — Encounter: Payer: Self-pay | Admitting: Family Medicine

## 2022-08-22 DIAGNOSIS — B350 Tinea barbae and tinea capitis: Secondary | ICD-10-CM | POA: Insufficient documentation

## 2022-08-22 DIAGNOSIS — L089 Local infection of the skin and subcutaneous tissue, unspecified: Secondary | ICD-10-CM | POA: Insufficient documentation

## 2022-08-22 DIAGNOSIS — B9689 Other specified bacterial agents as the cause of diseases classified elsewhere: Secondary | ICD-10-CM | POA: Insufficient documentation

## 2022-08-22 NOTE — Assessment & Plan Note (Signed)
Report of continued falls and receiving PT with no f/u , will arrsnge for same

## 2022-08-22 NOTE — Assessment & Plan Note (Signed)
Improved  Patient re-educated about  the importance of commitment to a  minimum of 150 minutes of exercise per week as able.  The importance of healthy food choices with portion control discussed, as well as eating regularly and within a 12 hour window most days. The need to choose "clean , green" food 50 to 75% of the time is discussed, as well as to make water the primary drink and set a goal of 64 ounces water daily.       08/15/2022   11:30 AM 06/30/2022    9:23 AM 06/16/2022   10:02 AM  Weight /BMI  Weight 256 lb 1.3 oz 263 lb 263 lb 1.9 oz  Height 5\' 7"  (1.702 m) 5\' 7"  (1.702 m) 5\' 7"  (1.702 m)  BMI 40.11 kg/m2 41.19 kg/m2 41.21 kg/m2

## 2022-08-22 NOTE — Assessment & Plan Note (Signed)
Patient educated about the importance of limiting  Carbohydrate intake , the need to commit to daily physical activity for a minimum of 30 minutes , and to commit weight loss. The fact that changes in all these areas will reduce or eliminate all together the development of diabetes is stressed.      Latest Ref Rng & Units 03/31/2022   11:30 AM 08/30/2021   10:30 AM 12/23/2020    4:36 PM 06/23/2020   11:00 AM 06/23/2020   10:22 AM  Diabetic Labs  HbA1c 4.8 - 5.6 % 5.7  5.7  5.7  5.7    Chol 100 - 199 mg/dL 161  096  045     HDL >40 mg/dL 62  72  71     Calc LDL 0 - 99 mg/dL 57  74  85     Triglycerides 0 - 149 mg/dL 46  35  50     Creatinine 0.76 - 1.27 mg/dL 9.81  1.91  4.78   2.95       08/15/2022   11:30 AM 08/08/2022    9:16 AM 06/30/2022    9:23 AM 06/16/2022   10:02 AM 04/04/2022   11:32 AM 12/27/2021   11:10 AM 08/26/2021   11:02 AM  BP/Weight  Systolic BP 126 124 116 135 123 121 122  Diastolic BP 80 81 70 81 79 80 77  Wt. (Lbs) 256.08  263 263.12  279.08 289.12  BMI 40.11 kg/m2  41.19 kg/m2 41.21 kg/m2  43.71 kg/m2 45.28 kg/m2      01/18/2016   10:45 AM 09/30/2014    2:45 PM  Foot/eye exam completion dates  Foot Form Completion Done Done

## 2022-08-22 NOTE — Progress Notes (Signed)
Jamie Frost     MRN: [id number]      DOB: 1975-11-28  Chief Complaint  Patient presents with   Follow-up    Follow up spot on back of head     HPI Jamie Frost is here for follow up and re-evaluation of chronic medical conditions, medication management and review of any available recent lab and radiology data.  Preventive health is updated, specifically  Cancer screening and Immunization.   Questions or concerns regarding consultations or procedures which the PT has had in the interim are  addressed. The PT denies any adverse reactions to current medications since the last visit.  C/o scaly itchy area on posterior head x weeks , not getting better but increasing in size  C/o unsteady gait and ongoing falls, msg sent by Aunt, has been in pY for over 1 month with Ortho f/u on file will need re eval Aunt reports excess nervousness  ROS Denies recent fever or chills. Denies sinus pressure, nasal congestion, ear pain or sore throat. Denies chest congestion, productive cough or wheezing. Denies chest pains, palpitations and leg swelling Denies abdominal pain, nausea, vomiting,diarrhea or constipation.   Denies dysuria, frequency, hesitancy or incontinence. Denies headaches, seizures, numbness, or tingling. Marland Kitchen   PE  BP 126/80 (BP Location: Right Arm, Patient Position: Sitting, Cuff Size: Large)   Pulse 80   Ht 5\' 7"  (1.702 m)   Wt 256 lb 1.3 oz (116.2 kg)   SpO2 96%   BMI 40.11 kg/m   Patient alert and oriented and in no cardiopulmonary distress.  HEENT: No facial asymmetry, EOMI,     Neck supple .  Chest: Clear to auscultation bilaterally.  CVS: S1, S2 no murmurs, no S3.Regular rate.  ABD: Soft non tender.   Ext: No edema  MS: decreased  ROM spine, shoulders, hips and knees.  Skin: tinea capitis with bacterial superinfection from scratching Psych: Good eye contact, normal affect.  not anxious or depressed appearing.  CNS: CN 2-12 intact, power,  normal  throughout.no focal deficits noted.   Assessment & Plan  Tinea capitis Terbinafine x 6 weeks, nizoral shampoo twice weekly  Bacterial skin infection Keflex x 1 week  Morbid obesity (HCC) Improved  Patient re-educated about  the importance of commitment to a  minimum of 150 minutes of exercise per week as able.  The importance of healthy food choices with portion control discussed, as well as eating regularly and within a 12 hour window most days. The need to choose "clean , green" food 50 to 75% of the time is discussed, as well as to make water the primary drink and set a goal of 64 ounces water daily.       08/15/2022   11:30 AM 06/30/2022    9:23 AM 06/16/2022   10:02 AM  Weight /BMI  Weight 256 lb 1.3 oz 263 lb 263 lb 1.9 oz  Height 5\' 7"  (1.702 m) 5\' 7"  (1.702 m) 5\' 7"  (1.702 m)  BMI 40.11 kg/m2 41.19 kg/m2 41.21 kg/m2      Prediabetes Patient educated about the importance of limiting  Carbohydrate intake , the need to commit to daily physical activity for a minimum of 30 minutes , and to commit weight loss. The fact that changes in all these areas will reduce or eliminate all together the development of diabetes is stressed.      Latest Ref Rng & Units 03/31/2022   11:30 AM 08/30/2021   10:30 AM 12/23/2020  4:36 PM 06/23/2020   11:00 AM 06/23/2020   10:22 AM  Diabetic Labs  HbA1c 4.8 - 5.6 % 5.7  5.7  5.7  5.7    Chol 100 - 199 mg/dL 409  811  914     HDL >78 mg/dL 62  72  71     Calc LDL 0 - 99 mg/dL 57  74  85     Triglycerides 0 - 149 mg/dL 46  35  50     Creatinine 0.76 - 1.27 mg/dL 2.95  6.21  3.08   6.57       08/15/2022   11:30 AM 08/08/2022    9:16 AM 06/30/2022    9:23 AM 06/16/2022   10:02 AM 04/04/2022   11:32 AM 12/27/2021   11:10 AM 08/26/2021   11:02 AM  BP/Weight  Systolic BP 126 124 116 135 123 121 122  Diastolic BP 80 81 70 81 79 80 77  Wt. (Lbs) 256.08  263 263.12  279.08 289.12  BMI 40.11 kg/m2  41.19 kg/m2 41.21 kg/m2  43.71 kg/m2 45.28  kg/m2      01/18/2016   10:45 AM 09/30/2014    2:45 PM  Foot/eye exam completion dates  Foot Form Completion Done Done      Recurrent falls while walking Report of continued falls and receiving PT with no f/u , will arrsnge for same

## 2022-08-22 NOTE — Assessment & Plan Note (Signed)
Terbinafine x 6 weeks, nizoral shampoo twice weekly

## 2022-08-22 NOTE — Assessment & Plan Note (Signed)
Keflex x 1 week ?

## 2022-08-24 ENCOUNTER — Ambulatory Visit: Payer: 59

## 2022-08-24 DIAGNOSIS — M25552 Pain in left hip: Secondary | ICD-10-CM | POA: Diagnosis not present

## 2022-08-24 NOTE — Therapy (Signed)
OUTPATIENT PHYSICAL THERAPY LOWER EXTREMITY TREATMENT   Patient Name: Jamie Frost MRN: [id number] DOB:[birth year], 47 y.o., male Today's Date: 08/24/2022  END OF SESSION:  PT End of Session - 08/24/22 1027     Visit Number 8    Number of Visits 8    Date for PT Re-Evaluation 10/18/22    PT Start Time 1016    PT Stop Time 1100    PT Time Calculation (min) 44 min    Equipment Utilized During Treatment Other (comment)   SPC   Activity Tolerance Patient tolerated treatment well    Behavior During Therapy WFL for tasks assessed/performed            Past Medical History:  Diagnosis Date   Allergic rhinitis, seasonal    Ambulates with cane    straight   Arthritis    Diabetes mellitus without complication (HCC)    type 2 - no meds   Hyperlipidemia    Hypertension    Impaired fasting glucose 12/09/2009   Obesity    Pulmonary hypoplasia    Right leg injury    wears brace LLE - per pt "foot turns over without brace"   Past Surgical History:  Procedure Laterality Date   ANKLE FUSION Left 08/09/2018   Procedure: LEFT TIBIO-CALCANEAL FUSION;  Surgeon: Nadara Mustard, MD;  Location: [facility] OR;  Service: Orthopedics;  Laterality: Left;   ANKLE FUSION Right 06/25/2020   Procedure: RIGHT TIBIOCALCANEAL FUSION;  Surgeon: Nadara Mustard, MD;  Location: [facility] OR;  Service: Orthopedics;  Laterality: Right;   bilateral inguinal hernia     COLONOSCOPY WITH PROPOFOL N/A 03/17/2021   Procedure: COLONOSCOPY WITH PROPOFOL;  Surgeon: Corbin Ade, MD;  Location: [facility];  Service: Endoscopy;  Laterality: N/A;  10:15am   I/D abcess on back  2007   1 WEEK    right great toe surgery     Patient Active Problem List   Diagnosis Date Noted   Tinea capitis 08/22/2022   Bacterial skin infection 08/22/2022   Vitamin D deficiency 07/19/2022   Right hip pain 06/22/2022   Fall 06/22/2022   Urinary frequency 06/22/2022   Incontinence 06/22/2022   Hematuria 06/22/2022   Incontinence of feces  06/22/2022   Lumbar back pain with radiculopathy affecting left lower extremity 04/04/2022   Encounter for screening colonoscopy 01/18/2021   Ankle instability, right    Acquired equinovarus deformity of left foot    Body mass index 40.0-44.9, adult (HCC) 07/16/2018   Spastic diplegic cerebral palsy (HCC) 07/16/2018   Equinus contracture of left ankle 07/16/2018   Cavovarus deformity of foot, acquired, left 07/16/2018   Recurrent falls while walking 07/10/2018   Prediabetes 08/12/2016   Pulmonary hypoplasia 09/02/2010   Generalized anxiety disorder 10/11/2008   Hyperlipidemia LDL goal <100 09/23/2007   Morbid obesity (HCC) 09/23/2007   ALLERGIC RHINITIS, SEASONAL 09/23/2007   REFERRING PROVIDER: Fuller Canada MD  REFERRING DIAG: Pain in left hip and DDD (lumbar).  THERAPY DIAG:  Pain in left hip  Rationale for Evaluation and Treatment: Rehabilitation  ONSET DATE: 04/02/22.  SUBJECTIVE:   SUBJECTIVE STATEMENT: Patient reports that he feels good today. He reports that his leg is not hurting any more.  PERTINENT HISTORY: See above, mental deficiency.    PAIN:  Are you having pain? No  PRECAUTIONS: Fall.  Patient uses a cane.  Please use gait belt if walking.  WEIGHT BEARING RESTRICTIONS: No  FALLS:  Has patient fallen in last 6 months?  Yes. Number of falls 1.  LIVING ENVIRONMENT: Lives with: lives with their family Lives in: House/apartment Has following equipment at home: Single point cane  PLOF: Independent with basic ADLs and Independent with household mobility with device  PATIENT GOALS: Not have pain.  OBJECTIVE:   DIAGNOSTIC FINDINGS:   Mild degenerative changes at L4-L5 and L5-S1.  X-rays show normal femoral neck-shaft angle normal femoral head normal acetabulum   No evidence of arthritis or fracture   Impression normal pelvis and normal hip  PALPATION: Patient reporting pain over his left buttock region and pain over left anterior thigh  though not particularly tender to palpation.  He did not c/o of pain in his lumbar per palpation.  LOWER EXTREMITY ROM: In supine he attempted to flexion his left hip but was in a great deal of pain.    LOWER EXTREMITY MMT: He performed antigravity knee extension but was unable to raise his left LE against gravity.    GAIT: Patient walked in clinic with a straight cane with gait belt utilized.  He walked with essentially minimal hip motion and knees in extension.  A "waddling-type" gait pattern.  TODAY'S TREATMENT:                                   6/20 EXERCISE LOG  Exercise Repetitions and Resistance Comments  Nustep  L4 x 18 minutes   SLR 5 reps    Thomas stretch  2 minutes   LAQ 4# x 2 minutes Alternating LE  Sit to stand 12 reps   Seated hip ADD isometric  2 minutes w/ 5 second hold             Blank cell = exercise not performed today   08/21/22   EXERCISE LOG  Exercise Repetitions and Resistance Comments  Nustep Lvl 3 x 15 mins   Lunges 6" step x20 reps for stretch BLE   LAQs 4# x 20 reps   Standing marching BLE x20 reps   Standing hip abduction BLE x10 reps   Clams Red x 30 reps   Ham Curl Green x 20 reps   Supine hip flexor stretch 3 x 30 sec PT notable in mid L quad  LLE SLR Attempted but weak and reported tightness of L quad    Blank cell = exercise not performed today   Manual Therapy Soft Tissue Mobilization: L quad, to reduce tone and IASTW utilized lightly for 2 minutes     Modalities  Date: 08/21/22 Unattended Estim: Left quad, IFC 80-150 Hz, 15 mins, Pain Hot Pack: Left quad, 15 mins, Pain and Tone  *Patient requested no LE wedge or bolster   ASSESSMENT:  CLINICAL IMPRESSION: Patient has made excellent progress with skilled physical therapy as he was able to meet all of his goals. He reported that he was not having pain anymore. He was also able to demonstrate improved left lower extremity strength as he was able to demonstrate a straight leg raise  without pain. His HEP was reviewed and he reported feeling good with these interventions. He felt comfortable being discharged with his HEP at this time.   PHYSICAL THERAPY DISCHARGE SUMMARY  Visits from Start of Care: 8  Current functional level related to goals / functional outcomes: Patient has met all of his goals for skilled physical therapy.    Remaining deficits: None    Education / Equipment: HEP and educated on  his progress with therapy   Patient agrees to discharge. Patient goals were met. Patient is being discharged due to meeting the stated rehab goals.   OBJECTIVE IMPAIRMENTS: Abnormal gait, decreased activity tolerance, decreased ROM, decreased strength, and pain.   ACTIVITY LIMITATIONS: locomotion level  PERSONAL FACTORS: 1 comorbidity: mental deficiency   are also affecting patient's functional outcome.   REHAB POTENTIAL: Good  CLINICAL DECISION MAKING: Stable/uncomplicated  EVALUATION COMPLEXITY: Low  GOALS:  SHORT TERM GOALS: Target date: 08/03/22.  Ind with a HEP. Goal status: MET  LONG TERM GOALS: Target date: 10/18/22  Perform a left SLR with pain not > 3/10.  Goal status: MET  2.  Eliminate left LE symptoms.  Goal status: MET  PLAN:  PT FREQUENCY: 1-2x/week  PT DURATION: 6 weeks  PLANNED INTERVENTIONS: Therapeutic exercises, Therapeutic activity, Neuromuscular re-education, Gait training, Patient/Family education, Self Care, Electrical stimulation, Cryotherapy, Moist heat, Ultrasound, and Manual therapy  PLAN FOR NEXT SESSION: Nustep, therex, modalities and STW/M.  Granville Lewis, PT 08/24/2022, 12:11 PM

## 2022-08-30 ENCOUNTER — Ambulatory Visit: Payer: 59 | Admitting: Family Medicine

## 2022-10-02 ENCOUNTER — Ambulatory Visit: Payer: 59 | Admitting: Urology

## 2022-10-05 NOTE — Progress Notes (Deleted)
Name: Jamie Frost DOB: 07/31/1975 MRN: [id number]  History of Present Illness: Jamie Frost is a 47 y.o. male who presents today for follow up visit at [facility]. Accompanied by his ***father Jamie Frost. - GU history: 1. OAB with urinary frequency, urgency, and urge incontinence.  At initial visit on 08/08/2022: - No evidence of clinically significant microscopic hematuria. - Leaking once every other day on average. Wears 1 pull ups overnight typically and changes his clothes if needed due to leakage during the day. Reported the urinary incontinence is not significantly bothersome.  - Reports caffeine intake (soda and tea).  - Urinating 3-4x/day and 3x/night.  - He elected to work on behavioral modifications including decreasing caffeine intake and timed voiding every 3 hours while awake.   Since last visit: ***  Today: He {Actions; denies-reports:120008} symptomatic improvement since ***decreasing caffeine intake and starting ***timed voiding.  He reports {Blank multiple:19197::"improved","persistent / unchanged"} urinary ***frequency, ***nocturia, ***urgency, and ***urge incontinence. Voiding ***x/day and ***x/night on average. Leaking ***x/day on average; using *** ***pads / ***diapers per day on average.  He {Actions; denies-reports:120008} dysuria, gross hematuria, straining to void, or sensations of incomplete emptying.   Fall Screening: Do you usually have a device to assist in your mobility? {yes/no:20286} ***cane / ***walker / ***wheelchair   Medications: Current Outpatient Medications  Medication Sig Dispense Refill   acetaminophen (TYLENOL) 500 MG tablet Take one tablet at bedtime as needed, for pain 30 tablet 5   busPIRone (BUSPAR) 7.5 MG tablet TAKE 1 TABLET BY MOUTH TWICE  DAILY (Patient taking differently: TAKE 1 TABLET BY MOUTH TWICE  DAILY) 200 tablet 2   cephALEXin (KEFLEX) 500 MG capsule Take 1 capsule (500 mg total) by mouth 2 (two)  times daily. 14 capsule 0   ferrous sulfate 325 (65 FE) MG tablet Take 325 mg by mouth daily with breakfast.     fluticasone (FLONASE) 50 MCG/ACT nasal spray Place 2 sprays into both nostrils daily. 48 g 1   ketoconazole (NIZORAL) 2 % shampoo Apply 1 Application topically 2 (two) times a week. 120 mL 1   loratadine (CLARITIN) 10 MG tablet TAKE ONE (1) TABLET BY MOUTH EVERY DAY 90 tablet 1   simvastatin (ZOCOR) 40 MG tablet Take 1 tablet (40 mg total) by mouth at bedtime. 90 tablet 1   terbinafine (LAMISIL) 250 MG tablet Take 1 tablet (250 mg total) by mouth daily. 42 tablet 0   UNABLE TO FIND Med Name: Cane x 1 Dx: m54.16 1 each 0   Vitamin D, Ergocalciferol, (DRISDOL) 1.25 MG (50000 UNIT) CAPS capsule Take 1 capsule (50,000 Units total) by mouth every 7 (seven) days. 12 capsule 3   No current facility-administered medications for this visit.    Allergies: Allergies  Allergen Reactions   Sulfonamide Derivatives Rash    On legs     Past Medical History:  Diagnosis Date   Allergic rhinitis, seasonal    Ambulates with cane    straight   Arthritis    Diabetes mellitus without complication (HCC)    type 2 - no meds   Hyperlipidemia    Hypertension    Impaired fasting glucose 12/09/2009   Obesity    Pulmonary hypoplasia    Right leg injury    wears brace LLE - per pt "foot turns over without brace"   Past Surgical History:  Procedure Laterality Date   ANKLE FUSION Left 08/09/2018   Procedure: LEFT TIBIO-CALCANEAL FUSION;  Surgeon: Nadara Mustard,  MD;  Location: [facility] OR;  Service: Orthopedics;  Laterality: Left;   ANKLE FUSION Right 06/25/2020   Procedure: RIGHT TIBIOCALCANEAL FUSION;  Surgeon: Nadara Mustard, MD;  Location: [facility] OR;  Service: Orthopedics;  Laterality: Right;   bilateral inguinal hernia     COLONOSCOPY WITH PROPOFOL N/A 03/17/2021   Procedure: COLONOSCOPY WITH PROPOFOL;  Surgeon: Corbin Ade, MD;  Location: [facility];  Service: Endoscopy;  Laterality: N/A;   10:15am   I/D abcess on back  2007   1 WEEK    right great toe surgery     Family History  Problem Relation Age of Onset   Obesity Mother    Hypertension Mother    Arthritis Mother    Colon cancer Neg Hx    Colon polyps Neg Hx    Social History   Socioeconomic History   Marital status: Single    Spouse name: Not on file   Number of children: Not on file   Years of education: Not on file   Highest education level: 12th grade  Occupational History   Not on file  Tobacco Use   Smoking status: Never   Smokeless tobacco: Never  Vaping Use   Vaping status: Never Used  Substance and Sexual Activity   Alcohol use: No   Drug use: No   Sexual activity: Not Currently  Other Topics Concern   Not on file  Social History Narrative   He enjoys reading the bible during his free time.    Social Determinants of Health   Financial Resource Strain: Low Risk  (08/13/2022)   Overall Financial Resource Strain (CARDIA)    Difficulty of Paying Living Expenses: Not hard at all  Food Insecurity: No Food Insecurity (08/13/2022)   Hunger Vital Sign    Worried About Running Out of Food in the Last Year: Never true    Ran Out of Food in the Last Year: Never true  Transportation Needs: No Transportation Needs (08/13/2022)   PRAPARE - Administrator, Civil Service (Medical): No    Lack of Transportation (Non-Medical): No  Physical Activity: Insufficiently Active (08/13/2022)   Exercise Vital Sign    Days of Exercise per Week: 1 day    Minutes of Exercise per Session: 30 min  Stress: No Stress Concern Present (08/13/2022)   [employer] of Occupational Health - Occupational Stress Questionnaire    Feeling of Stress : Not at all  Social Connections: Moderately Integrated (08/13/2022)   Social Connection and Isolation Panel [NHANES]    Frequency of Communication with Friends and Family: More than three times a week    Frequency of Social Gatherings with Friends and Family: More than three  times a week    Attends Religious Services: More than 4 times per year    Active Member of [employer] or Organizations: No    Attends Engineer, structural: More than 4 times per year    Marital Status: Never married  Intimate Partner Violence: Not At Risk (02/14/2022)   Humiliation, Afraid, Rape, and Kick questionnaire    Fear of Current or Ex-Partner: No    Emotionally Abused: No    Physically Abused: No    Sexually Abused: No    Review of Systems Constitutional: Patient ***denies any unintentional weight loss or change in strength lntegumentary: Patient ***denies any rashes or pruritus Eyes: Patient denies ***dry eyes ENT: Patient ***denies dry mouth Cardiovascular: Patient ***denies chest pain or syncope Respiratory: Patient ***denies shortness of  breath Gastrointestinal: Patient ***denies nausea, vomiting, constipation, or diarrhea Musculoskeletal: Patient ***denies muscle cramps or weakness Neurologic: Patient ***denies convulsions or seizures Psychiatric: Patient ***denies memory problems Allergic/Immunologic: Patient ***denies recent allergic reaction(s) Hematologic/Lymphatic: Patient denies bleeding tendencies Endocrine: Patient ***denies heat/cold intolerance  GU: As per HPI.  OBJECTIVE There were no vitals filed for this visit. There is no height or weight on file to calculate BMI.  Physical Examination  Constitutional: ***No obvious distress; patient is ***non-toxic appearing  Cardiovascular: ***No visible lower extremity edema.  Respiratory: The patient does ***not have audible wheezing/stridor; respirations do ***not appear labored  Gastrointestinal: Abdomen ***non-distended Musculoskeletal: ***Normal ROM of UEs  Skin: ***No obvious rashes/open sores  Neurologic: CN 2-12 grossly ***intact Psychiatric: Answered questions ***appropriately with ***normal affect  Hematologic/Lymphatic/Immunologic: ***No obvious bruises or sites of spontaneous bleeding  UA:  {Desc; negative/positive:13464} for *** WBC/hpf, *** RBC/hpf, bacteria (***) PVR: *** ml  ASSESSMENT No diagnosis found. ***  Will plan for follow up in *** months / ***1 year or sooner if needed. Pt verbalized understanding and agreement. All questions were answered.  PLAN Advised the following: 1. *** 2. ***No follow-ups on file.  No orders of the defined types were placed in this encounter.   It has been explained that the patient is to follow regularly with their PCP in addition to all other providers involved in their care and to follow instructions provided by these respective offices. Patient advised to contact urology clinic if any urologic-pertaining questions, concerns, new symptoms or problems arise in the interim period.  There are no Patient Instructions on file for this visit.  Electronically signed by:  Donnita Falls, FNP   10/05/22    4:05 PM

## 2022-10-10 ENCOUNTER — Ambulatory Visit: Payer: 59 | Admitting: Urology

## 2022-10-10 DIAGNOSIS — R351 Nocturia: Secondary | ICD-10-CM

## 2022-10-10 DIAGNOSIS — R3915 Urgency of urination: Secondary | ICD-10-CM

## 2022-10-10 DIAGNOSIS — N3941 Urge incontinence: Secondary | ICD-10-CM

## 2022-10-24 NOTE — Progress Notes (Signed)
Name: Jamie Frost DOB: Apr 23, 1975 MRN: [id number]  History of Present Illness: Mr. Jamie Frost is a 47 y.o. male who presents today for follow up visit at [facility] [place]. Accompanied by his cousin Jamie Frost. - GU history: 1. OAB with urinary frequency, urgency, and urge incontinence.  At initial visit on 08/08/2022: - No evidence of clinically significant microscopic hematuria. - Leaking once every other day on average. Wears 1 pull ups overnight typically and changes his clothes if needed due to leakage during the day. Reported the urinary incontinence is not significantly bothersome. - Reports caffeine intake (soda and tea).  - Urinating 3-4x/day and 3x/night.  - He elected to work on behavioral modifications including decreasing caffeine intake and timed voiding every 3 hours while awake.   Today: He reports significant symptomatic improvement since decreasing caffeine intake and starting timed voiding. He denies bothersome urinary frequency, nocturia, or urgency. States he is no longer having any episodes of urge incontinence.    Medications: Current Outpatient Medications  Medication Sig Dispense Refill   acetaminophen (TYLENOL) 500 MG tablet Take one tablet at bedtime as needed, for pain 30 tablet 5   busPIRone (BUSPAR) 7.5 MG tablet TAKE 1 TABLET BY MOUTH TWICE  DAILY (Patient taking differently: TAKE 1 TABLET BY MOUTH TWICE  DAILY) 200 tablet 2   ferrous sulfate 325 (65 FE) MG tablet Take 325 mg by mouth daily with breakfast.     fluticasone (FLONASE) 50 MCG/ACT nasal spray Place 2 sprays into both nostrils daily. 48 g 1   ketoconazole (NIZORAL) 2 % shampoo Apply 1 Application topically 2 (two) times a week. 120 mL 1   loratadine (CLARITIN) 10 MG tablet TAKE ONE (1) TABLET BY MOUTH EVERY DAY 90 tablet 1   simvastatin (ZOCOR) 40 MG tablet Take 1 tablet (40 mg total) by mouth at bedtime. 90 tablet 1   terbinafine (LAMISIL) 250 MG tablet Take 1 tablet (250 mg  total) by mouth daily. 42 tablet 0   UNABLE TO FIND Med Name: Cane x 1 Dx: m54.16 1 each 0   Vitamin D, Ergocalciferol, (DRISDOL) 1.25 MG (50000 UNIT) CAPS capsule Take 1 capsule (50,000 Units total) by mouth every 7 (seven) days. 12 capsule 3   cephALEXin (KEFLEX) 500 MG capsule Take 1 capsule (500 mg total) by mouth 2 (two) times daily. 14 capsule 0   No current facility-administered medications for this visit.    Allergies: Allergies  Allergen Reactions   Sulfonamide Derivatives Rash    On legs     Past Medical History:  Diagnosis Date   Allergic rhinitis, seasonal    Ambulates with cane    straight   Arthritis    Diabetes mellitus without complication (HCC)    type 2 - no meds   Hyperlipidemia    Hypertension    Impaired fasting glucose 12/09/2009   Obesity    Pulmonary hypoplasia    Right leg injury    wears brace LLE - per pt "foot turns over without brace"   Past Surgical History:  Procedure Laterality Date   ANKLE FUSION Left 08/09/2018   Procedure: LEFT TIBIO-CALCANEAL FUSION;  Surgeon: Nadara Mustard, MD;  Location: [facility] OR;  Service: Orthopedics;  Laterality: Left;   ANKLE FUSION Right 06/25/2020   Procedure: RIGHT TIBIOCALCANEAL FUSION;  Surgeon: Nadara Mustard, MD;  Location: [facility] OR;  Service: Orthopedics;  Laterality: Right;   bilateral inguinal hernia     COLONOSCOPY WITH PROPOFOL N/A 03/17/2021   Procedure:  COLONOSCOPY WITH PROPOFOL;  Surgeon: Corbin Ade, MD;  [facility];  Service: Endoscopy;  Laterality: N/A;  10:15am   I/D abcess on back  2007   1 WEEK    right great toe surgery     Family History  Problem Relation Age of Onset   Obesity Mother    Hypertension Mother    Arthritis Mother    Colon cancer Neg Hx    Colon polyps Neg Hx    Social History   Socioeconomic History   Marital status: Single    Spouse name: Not on file   Number of children: Not on file   Years of education: Not on file   Highest education level: 12th grade   Occupational History   Not on file  Tobacco Use   Smoking status: Never   Smokeless tobacco: Never  Vaping Use   Vaping status: Never Used  Substance and Sexual Activity   Alcohol use: No   Drug use: No   Sexual activity: Not Currently  Other Topics Concern   Not on file  Social History Narrative   He enjoys reading the bible during his free time.    Social Determinants of Health   Financial Resource Strain: Low Risk  (08/13/2022)   Overall Financial Resource Strain (CARDIA)    Difficulty of Paying Living Expenses: Not hard at all  Food Insecurity: No Food Insecurity (08/13/2022)   Hunger Vital Sign    Worried About Running Out of Food in the Last Year: Never true    Ran Out of Food in the Last Year: Never true  Transportation Needs: No Transportation Needs (08/13/2022)   PRAPARE - Administrator, Civil Service (Medical): No    Lack of Transportation (Non-Medical): No  Physical Activity: Insufficiently Active (08/13/2022)   Exercise Vital Sign    Days of Exercise per Week: 1 day    Minutes of Exercise per Session: 30 min  Stress: No Stress Concern Present (08/13/2022)   [employer] of Occupational Health - Occupational Stress Questionnaire    Feeling of Stress : Not at all  Social Connections: Moderately Integrated (08/13/2022)   Social Connection and Isolation Panel [NHANES]    Frequency of Communication with Friends and Family: More than three times a week    Frequency of Social Gatherings with Friends and Family: More than three times a week    Attends Religious Services: More than 4 times per year    Active Member of [employer] or Organizations: No    Attends Engineer, structural: More than 4 times per year    Marital Status: Never married  Intimate Partner Violence: Not At Risk (02/14/2022)   Humiliation, Afraid, Rape, and Kick questionnaire    Fear of Current or Ex-Partner: No    Emotionally Abused: No    Physically Abused: No    Sexually Abused: No     Review of Systems Constitutional: Patient denies any unintentional weight loss or change in strength lntegumentary: Patient denies any rashes or pruritus Cardiovascular: Patient denies chest pain or syncope Respiratory: Patient denies shortness of breath Gastrointestinal: Patient denies nausea, vomiting, constipation, or diarrhea Musculoskeletal: Patient denies muscle cramps or weakness Neurologic: Patient denies convulsions or seizures Psychiatric: Patient denies memory problems Allergic/Immunologic: Patient denies recent allergic reaction(s) Hematologic/Lymphatic: Patient denies bleeding tendencies Endocrine: Patient denies heat/cold intolerance  GU: As per HPI.  OBJECTIVE Vitals:   11/02/22 1346  BP: 133/79  Pulse: 74  Temp: 98.6 F (37  C)   There is no height or weight on file to calculate BMI.  Physical Examination  Constitutional: No obvious distress; patient is non-toxic appearing  Cardiovascular: No visible lower extremity edema.  Respiratory: The patient does not have audible wheezing/stridor; respirations do not appear labored  Gastrointestinal: Abdomen non-distended Musculoskeletal: Normal ROM of UEs  Skin: No obvious rashes/open sores  Neurologic: CN 2-12 grossly intact Psychiatric: Answered questions appropriately with normal affect  Hematologic/Lymphatic/Immunologic: No obvious bruises or sites of spontaneous bleeding  UA: no evidence of UTI or microscopic hematuria PVR: 0 ml  ASSESSMENT Urinary frequency - Plan: Urinalysis, Routine w reflex microscopic, BLADDER SCAN AMB NON-IMAGING  Urinary urgency - Plan: Urinalysis, Routine w reflex microscopic, BLADDER SCAN AMB NON-IMAGING  Urge incontinence - Plan: Urinalysis, Routine w reflex microscopic, BLADDER SCAN AMB NON-IMAGING  OAB symptoms are essentially resolved with behavioral modifications. He elected to continue those and to follow up with urology on a PRN basis. All questions were  answered.  PLAN Advised the following: 1. Continue timed voiding and minimal caffeine intake. 2. Return if symptoms worsen or fail to improve.  Orders Placed This Encounter  Procedures   Urinalysis, Routine w reflex microscopic   BLADDER SCAN AMB NON-IMAGING    It has been explained that the patient is to follow regularly with their PCP in addition to all other providers involved in their care and to follow instructions provided by these respective offices. Patient advised to contact urology clinic if any urologic-pertaining questions, concerns, new symptoms or problems arise in the interim period.  There are no Patient Instructions on file for this visit.  Electronically signed by:  Donnita Falls, FNP   11/02/22    2:45 PM

## 2022-10-25 ENCOUNTER — Encounter: Payer: 59 | Admitting: Family Medicine

## 2022-10-27 ENCOUNTER — Encounter: Payer: Self-pay | Admitting: Family Medicine

## 2022-11-02 ENCOUNTER — Encounter: Payer: Self-pay | Admitting: Urology

## 2022-11-02 ENCOUNTER — Ambulatory Visit (INDEPENDENT_AMBULATORY_CARE_PROVIDER_SITE_OTHER): Payer: 59 | Admitting: Urology

## 2022-11-02 VITALS — BP 133/79 | HR 74 | Temp 98.6°F

## 2022-11-02 DIAGNOSIS — R350 Frequency of micturition: Secondary | ICD-10-CM | POA: Diagnosis not present

## 2022-11-02 DIAGNOSIS — N3941 Urge incontinence: Secondary | ICD-10-CM | POA: Diagnosis not present

## 2022-11-02 DIAGNOSIS — R3915 Urgency of urination: Secondary | ICD-10-CM

## 2022-11-02 LAB — URINALYSIS, ROUTINE W REFLEX MICROSCOPIC
Bilirubin, UA: NEGATIVE
Glucose, UA: NEGATIVE
Ketones, UA: NEGATIVE
Leukocytes,UA: NEGATIVE
Nitrite, UA: NEGATIVE
Protein,UA: NEGATIVE
Specific Gravity, UA: 1.02 (ref 1.005–1.030)
Urobilinogen, Ur: 1 mg/dL (ref 0.2–1.0)
pH, UA: 6.5 (ref 5.0–7.5)

## 2022-11-02 LAB — BLADDER SCAN AMB NON-IMAGING: Scan Result: 0

## 2022-11-04 ENCOUNTER — Other Ambulatory Visit: Payer: Self-pay | Admitting: Family Medicine

## 2022-11-06 ENCOUNTER — Encounter: Payer: Self-pay | Admitting: Family Medicine

## 2022-11-06 DIAGNOSIS — H9190 Unspecified hearing loss, unspecified ear: Secondary | ICD-10-CM

## 2022-11-07 ENCOUNTER — Encounter: Payer: Self-pay | Admitting: Family Medicine

## 2022-11-07 ENCOUNTER — Ambulatory Visit (INDEPENDENT_AMBULATORY_CARE_PROVIDER_SITE_OTHER): Payer: 59 | Admitting: Family Medicine

## 2022-11-07 VITALS — BP 130/84 | HR 81 | Ht 67.0 in | Wt 223.1 lb

## 2022-11-07 DIAGNOSIS — E785 Hyperlipidemia, unspecified: Secondary | ICD-10-CM

## 2022-11-07 DIAGNOSIS — E559 Vitamin D deficiency, unspecified: Secondary | ICD-10-CM

## 2022-11-07 DIAGNOSIS — H9190 Unspecified hearing loss, unspecified ear: Secondary | ICD-10-CM

## 2022-11-07 DIAGNOSIS — Z23 Encounter for immunization: Secondary | ICD-10-CM | POA: Diagnosis not present

## 2022-11-07 DIAGNOSIS — R634 Abnormal weight loss: Secondary | ICD-10-CM

## 2022-11-07 DIAGNOSIS — Z0001 Encounter for general adult medical examination with abnormal findings: Secondary | ICD-10-CM | POA: Diagnosis not present

## 2022-11-07 DIAGNOSIS — I10 Essential (primary) hypertension: Secondary | ICD-10-CM

## 2022-11-07 DIAGNOSIS — R7303 Prediabetes: Secondary | ICD-10-CM

## 2022-11-07 NOTE — Assessment & Plan Note (Signed)
After obtaining informed consent, the influenza vaccine is  administered , with no adverse effect noted at the time of administration.

## 2022-11-07 NOTE — Assessment & Plan Note (Signed)
Annual exam as documented. Counseling done  re healthy lifestyle involving commitment to 150 minutes exercise per week, heart healthy diet, and attaining healthy weight.The importance of adequate sleep also discussed.  Immunization and cancer screening needs are specifically addressed at this visit.  

## 2022-11-07 NOTE — Progress Notes (Signed)
   Jamie Frost     MRN: [id number]      DOB: 27-Aug-1975  Chief Complaint  Patient presents with   Follow-up    Follow up runny nose negative for covid need pca referral    Annual Exam    HPI Jamie Frost is here for follow up and re-evaluation of chronic medical conditions, medication management and review of any available recent lab and radiology data.  Preventive health is updated, specifically  Cancer screening and Immunization.   Questions or concerns regarding consultations or procedures which the PT has had in the interim are  addressed. The PT denies any adverse reactions to current medications since the last visit.  There are no new concerns.  There are no specific complaints   ROS Denies recent fever or chills. Denies sinus pressure, nasal congestion, ear pain or sore throat. Denies chest congestion, productive cough or wheezing. Denies chest pains, palpitations and leg swelling Denies abdominal pain, nausea, vomiting,diarrhea or constipation.   Denies dysuria, frequency, hesitancy or incontinence. Denies joint pain, swelling and limitation in mobility. Denies headaches, seizures, numbness, or tingling. Denies depression, anxiety or insomnia. Denies skin break down or rash.   PE  BP 130/84 (BP Location: Right Arm, Patient Position: Sitting, Cuff Size: Large)   Pulse 81   Ht 5\' 7"  (1.702 m)   Wt 223 lb 1.9 oz (101.2 kg)   SpO2 97%   BMI 34.95 kg/m   Patient alert and oriented and in no cardiopulmonary distress.  HEENT: No facial asymmetry, EOMI,     Neck supple .  Chest: Clear to auscultation bilaterally.decreased air entry  CVS: S1, S2 no murmurs, no S3.Regular rate.  ABD: Soft non tender.   Ext: No edema  MS: decreased  ROM spine, shoulders, hips and knees.  Skin: Intact, no ulcerations or rash noted.  Psych: Good eye contact, normal affect.  not anxious or depressed appearing.  CNS: CN 2-12 intact, power,  normal throughout.no focal deficits  noted.   Assessment & Plan Encounter for Medicare annual examination with abnormal findings Annual exam as documented. Counseling done  re healthy lifestyle involving commitment to 150 minutes exercise per week, heart healthy diet, and attaining healthy weight.The importance of adequate sleep also discussed. Immunization and cancer screening needs are specifically addressed at this visit.   Immunization due After obtaining informed consent, the  influenza vaccine is  administered , with no adverse effect noted at the time of administration.   Excessive weight loss Check TSH and hBA1C  Hearing reduced Family concerned about hearing loss, refer for eval        Assessment & Plan:  Encounter for Medicare annual examination with abnormal findings Annual exam as documented. Counseling done  re healthy lifestyle involving commitment to 150 minutes exercise per week, heart healthy diet, and attaining healthy weight.The importance of adequate sleep also discussed. Immunization and cancer screening needs are specifically addressed at this visit.   Immunization due After obtaining informed consent, the  influenza vaccine is  administered , with no adverse effect noted at the time of administration.   Excessive weight loss Check TSH and hBA1C  Hearing reduced Family concerned about hearing loss, refer for eval

## 2022-11-07 NOTE — Patient Instructions (Addendum)
F/u in early December re evaluate weight , call if you need me sooner  Flu vaccine today  Vision screen today  Covid vaccine recommended and is at your pharmacy  TdaP is recommended and is at your pharmacy   CBC, HBa1C, chem 7 and EGFr , and tSH 3 to 5  days before follow  up visit  Excessive weight loss is noted,pls weigh at home regularly  Concentrate on healthy food and drink choice, vegetables and water  Thankful that walking has improved  Form for services will be completed and you will be contacted  Thanks for choosing [facility], we consider it a privelige to serve you.

## 2022-11-07 NOTE — Assessment & Plan Note (Signed)
Check TSH and hBA1C

## 2022-11-10 ENCOUNTER — Telehealth: Payer: 59 | Admitting: Physician Assistant

## 2022-11-10 DIAGNOSIS — U071 COVID-19: Secondary | ICD-10-CM

## 2022-11-10 MED ORDER — NIRMATRELVIR/RITONAVIR (PAXLOVID)TABLET
3.0000 | ORAL_TABLET | Freq: Two times a day (BID) | ORAL | 0 refills | Status: AC
Start: 2022-11-10 — End: 2022-11-15

## 2022-11-10 MED ORDER — FLUTICASONE PROPIONATE 50 MCG/ACT NA SUSP
2.0000 | Freq: Every day | NASAL | 0 refills | Status: AC
Start: 2022-11-10 — End: ?

## 2022-11-10 MED ORDER — BENZONATATE 100 MG PO CAPS
100.0000 mg | ORAL_CAPSULE | Freq: Three times a day (TID) | ORAL | 0 refills | Status: DC | PRN
Start: 2022-11-10 — End: 2023-02-06

## 2022-11-10 NOTE — Progress Notes (Signed)
Virtual Visit Consent   Jamie Frost, you are scheduled for a virtual visit with a [facility] provider today. Just as with appointments in the office, your consent must be obtained to participate. Your consent will be active for this visit and any virtual visit you may have with one of our providers in the next 365 days. If you have a MyChart account, a copy of this consent can be sent to you electronically.  As this is a virtual visit, video technology does not allow for your provider to perform a traditional examination. This may limit your provider's ability to fully assess your condition. If your provider identifies any concerns that need to be evaluated in person or the need to arrange testing (such as labs, EKG, etc.), we will make arrangements to do so. Although advances in technology are sophisticated, we cannot ensure that it will always work on either your end or our end. If the connection with a video visit is poor, the visit may have to be switched to a telephone visit. With either a video or telephone visit, we are not always able to ensure that we have a secure connection.  By engaging in this virtual visit, you consent to the provision of healthcare and authorize for your insurance to be billed (if applicable) for the services provided during this visit. Depending on your insurance coverage, you may receive a charge related to this service.  I need to obtain your verbal consent now. Are you willing to proceed with your visit today? Jamie Frost has provided verbal consent on 11/10/2022 for a virtual visit (video or telephone). [name], PA-C  Date: 11/10/2022 6:20 PM  Virtual Visit via Video Note   I, [name], connected with  Jamie Frost  ([id number], March 18, 1975) on 11/10/22 at  6:15 PM EDT by a video-enabled telemedicine application and verified that I am speaking with the correct person using two identifiers.  Location: Patient: Virtual Visit  Location Patient: Home Provider: Virtual Visit Location Provider: Home Office   I discussed the limitations of evaluation and management by telemedicine and the availability of in person appointments. The patient expressed understanding and agreed to proceed.    History of Present Illness: Jamie Frost is a 47 y.o. who identifies as a male who was assigned male at birth, and is being seen today for Covid 81.  HPI: URI  This is a new problem. The current episode started in the past 7 days (Tested positive today on at home Covid 19 test; Symptoms started about 2 days ago). The problem has been gradually worsening. There has been no fever. Associated symptoms include congestion, coughing, headaches, rhinorrhea, sinus pain and sneezing. Pertinent negatives include no diarrhea, ear pain, nausea, plugged ear sensation, sore throat, vomiting or wheezing. He has tried acetaminophen (safe tussin for bp) for the symptoms. The treatment provided no relief.     Problems:  Patient Active Problem List   Diagnosis Date Noted   Immunization due 11/07/2022   Excessive weight loss 11/07/2022   Tinea capitis 08/22/2022   Bacterial skin infection 08/22/2022   Vitamin D deficiency 07/19/2022   Right hip pain 06/22/2022   Fall 06/22/2022   Urinary frequency 06/22/2022   Incontinence 06/22/2022   Hematuria 06/22/2022   Incontinence of feces 06/22/2022   Lumbar back pain with radiculopathy affecting left lower extremity 04/04/2022   Encounter for Medicare annual examination with abnormal findings 08/26/2021   Ankle instability, right  Acquired equinovarus deformity of left foot    Body mass index 40.0-44.9, adult (HCC) 07/16/2018   Spastic diplegic cerebral palsy (HCC) 07/16/2018   Equinus contracture of left ankle 07/16/2018   Cavovarus deformity of foot, acquired, left 07/16/2018   Recurrent falls while walking 07/10/2018   Prediabetes 08/12/2016   Pulmonary hypoplasia 09/02/2010   Generalized  anxiety disorder 10/11/2008   Hyperlipidemia LDL goal <100 09/23/2007   Morbid obesity (HCC) 09/23/2007   ALLERGIC RHINITIS, SEASONAL 09/23/2007    Allergies:  Allergies  Allergen Reactions   Sulfonamide Derivatives Rash    On legs    Medications:  Current Outpatient Medications:    benzonatate (TESSALON) 100 MG capsule, Take 1 capsule (100 mg total) by mouth 3 (three) times daily as needed., Disp: 30 capsule, Rfl: 0   fluticasone (FLONASE) 50 MCG/ACT nasal spray, Place 2 sprays into both nostrils daily., Disp: 16 g, Rfl: 0   nirmatrelvir/ritonavir (PAXLOVID) 20 x 150 MG & 10 x 100MG  TABS, Take 3 tablets by mouth 2 (two) times daily for 5 days. (Take nirmatrelvir 150 mg two tablets twice daily for 5 days and ritonavir 100 mg one tablet twice daily for 5 days) Patient GFR is 85, Disp: 30 tablet, Rfl: 0   acetaminophen (TYLENOL) 500 MG tablet, Take one tablet at bedtime as needed, for pain, Disp: 30 tablet, Rfl: 5   busPIRone (BUSPAR) 7.5 MG tablet, TAKE 1 TABLET BY MOUTH TWICE  DAILY (Patient taking differently: TAKE 1 TABLET BY MOUTH TWICE  DAILY), Disp: 200 tablet, Rfl: 2   ferrous sulfate 325 (65 FE) MG tablet, Take 325 mg by mouth daily with breakfast., Disp: , Rfl:    ketoconazole (NIZORAL) 2 % shampoo, Apply 1 Application topically 2 (two) times a week., Disp: 120 mL, Rfl: 1   loratadine (CLARITIN) 10 MG tablet, TAKE ONE (1) TABLET BY MOUTH EVERY DAY, Disp: 90 tablet, Rfl: 1   simvastatin (ZOCOR) 40 MG tablet, TAKE 1 TABLET BY MOUTH AT  BEDTIME, Disp: 100 tablet, Rfl: 2   terbinafine (LAMISIL) 250 MG tablet, Take 1 tablet (250 mg total) by mouth daily., Disp: 42 tablet, Rfl: 0   UNABLE TO FIND, Med Name: Cane x 1 Dx: m54.16, Disp: 1 each, Rfl: 0   Vitamin D, Ergocalciferol, (DRISDOL) 1.25 MG (50000 UNIT) CAPS capsule, Take 1 capsule (50,000 Units total) by mouth every 7 (seven) days., Disp: 12 capsule, Rfl: 3  Observations/Objective: Patient is well-developed, well-nourished in no  acute distress.  Resting comfortably at home.  Head is normocephalic, atraumatic.  No labored breathing.  Speech is clear and coherent with logical content.  Patient is alert and oriented at baseline.    Assessment and Plan: 1. COVID-19 - nirmatrelvir/ritonavir (PAXLOVID) 20 x 150 MG & 10 x 100MG  TABS; Take 3 tablets by mouth 2 (two) times daily for 5 days. (Take nirmatrelvir 150 mg two tablets twice daily for 5 days and ritonavir 100 mg one tablet twice daily for 5 days) Patient GFR is 85  Dispense: 30 tablet; Refill: 0 - MyChart COVID-19 home monitoring program; Future - benzonatate (TESSALON) 100 MG capsule; Take 1 capsule (100 mg total) by mouth 3 (three) times daily as needed.  Dispense: 30 capsule; Refill: 0 - fluticasone (FLONASE) 50 MCG/ACT nasal spray; Place 2 sprays into both nostrils daily.  Dispense: 16 g; Refill: 0  - Continue OTC symptomatic management of choice - Will send OTC vitamins and supplement information through AVS - Paxlovid prescribed - Fluticasone for nasal congestion -  Tessalon for cough - Patient enrolled in MyChart symptom monitoring - Push fluids - Rest as needed - Discussed return precautions and when to seek in-person evaluation, sent via AVS as well   Follow Up Instructions: I discussed the assessment and treatment plan with the patient. The patient was provided an opportunity to ask questions and all were answered. The patient agreed with the plan and demonstrated an understanding of the instructions.  A copy of instructions were sent to the patient via MyChart unless otherwise noted below.    The patient was advised to call back or seek an in-person evaluation if the symptoms worsen or if the condition fails to improve as anticipated.  Time:  I spent 10 minutes with the patient via telehealth technology discussing the above problems/concerns.    [name], PA-C

## 2022-11-10 NOTE — Patient Instructions (Signed)
Jamie Frost, thank you for joining [name], PA-C for today's virtual visit.  While this provider is not your primary care provider (PCP), if your PCP is located in our provider database this encounter information will be shared with them immediately following your visit.   A [facility] MyChart account gives you access to today's visit and all your visits, tests, and labs performed at [facility] " click here if you don't have a [facility] MyChart account or go to [email or web address]  Consent: (Patient) Jamie Frost provided verbal consent for this virtual visit at the beginning of the encounter.  Current Medications:  Current Outpatient Medications:    benzonatate (TESSALON) 100 MG capsule, Take 1 capsule (100 mg total) by mouth 3 (three) times daily as needed., Disp: 30 capsule, Rfl: 0   fluticasone (FLONASE) 50 MCG/ACT nasal spray, Place 2 sprays into both nostrils daily., Disp: 16 g, Rfl: 0   nirmatrelvir/ritonavir (PAXLOVID) 20 x 150 MG & 10 x 100MG  TABS, Take 3 tablets by mouth 2 (two) times daily for 5 days. (Take nirmatrelvir 150 mg two tablets twice daily for 5 days and ritonavir 100 mg one tablet twice daily for 5 days) Patient GFR is 85, Disp: 30 tablet, Rfl: 0   acetaminophen (TYLENOL) 500 MG tablet, Take one tablet at bedtime as needed, for pain, Disp: 30 tablet, Rfl: 5   busPIRone (BUSPAR) 7.5 MG tablet, TAKE 1 TABLET BY MOUTH TWICE  DAILY (Patient taking differently: TAKE 1 TABLET BY MOUTH TWICE  DAILY), Disp: 200 tablet, Rfl: 2   ferrous sulfate 325 (65 FE) MG tablet, Take 325 mg by mouth daily with breakfast., Disp: , Rfl:    ketoconazole (NIZORAL) 2 % shampoo, Apply 1 Application topically 2 (two) times a week., Disp: 120 mL, Rfl: 1   loratadine (CLARITIN) 10 MG tablet, TAKE ONE (1) TABLET BY MOUTH EVERY DAY, Disp: 90 tablet, Rfl: 1   simvastatin (ZOCOR) 40 MG tablet, TAKE 1 TABLET BY MOUTH AT  BEDTIME, Disp: 100 tablet, Rfl: 2    terbinafine (LAMISIL) 250 MG tablet, Take 1 tablet (250 mg total) by mouth daily., Disp: 42 tablet, Rfl: 0   UNABLE TO FIND, Med Name: Cane x 1 Dx: m54.16, Disp: 1 each, Rfl: 0   Vitamin D, Ergocalciferol, (DRISDOL) 1.25 MG (50000 UNIT) CAPS capsule, Take 1 capsule (50,000 Units total) by mouth every 7 (seven) days., Disp: 12 capsule, Rfl: 3   Medications ordered in this encounter:  Meds ordered this encounter  Medications   nirmatrelvir/ritonavir (PAXLOVID) 20 x 150 MG & 10 x 100MG  TABS    Sig: Take 3 tablets by mouth 2 (two) times daily for 5 days. (Take nirmatrelvir 150 mg two tablets twice daily for 5 days and ritonavir 100 mg one tablet twice daily for 5 days) Patient GFR is 85    Dispense:  30 tablet    Refill:  0    Order Specific Question:   Supervising Provider    Answer:   Merrilee Jansky [[id number]]   benzonatate (TESSALON) 100 MG capsule    Sig: Take 1 capsule (100 mg total) by mouth 3 (three) times daily as needed.    Dispense:  30 capsule    Refill:  0    Order Specific Question:   Supervising Provider    Answer:   Merrilee Jansky [[id number]]   fluticasone (FLONASE) 50 MCG/ACT nasal spray    Sig: Place 2 sprays into both nostrils daily.  Dispense:  16 g    Refill:  0    Order Specific Question:   Supervising Provider    Answer:   Merrilee Jansky [id number]     *If you need refills on other medications prior to your next appointment, please contact your pharmacy*  Follow-Up: Call back or seek an in-person evaluation if the symptoms worsen or if the condition fails to improve as anticipated.  [facility] [phone]  Care Instructions: Can take to lessen severity: Vit C 500mg  twice daily Quercertin 250-500mg  twice daily Zinc 75-100mg  daily Melatonin 3-6 mg at bedtime Vit D3 1000-2000 IU daily Aspirin 81 mg daily with food Optional: Famotidine 20mg  daily Also can add tylenol/ibuprofen as needed for fevers and body aches May add Mucinex or  Mucinex DM as needed for cough/congestion    Isolation Instructions: You are to isolate at home until you have been fever free for at least 24 hours without a fever-reducing medication, and symptoms have been steadily improving for 24 hours. At that time,  you can end isolation but need to mask for an additional 5 days.   If you must be around other household members who do not have symptoms, you need to make sure that both you and the family members are masking consistently with a high-quality mask.  If you note any worsening of symptoms despite treatment, please seek an in-person evaluation ASAP. If you note any significant shortness of breath or any chest pain, please seek ER evaluation. Please do not delay care!   COVID-19: What to Do if You Are Sick If you test positive and are an older adult or someone who is at high risk of getting very sick from COVID-19, treatment may be available. Contact a healthcare provider right away after a positive test to determine if you are eligible, even if your symptoms are mild right now. You can also visit a Test to Treat location and, if eligible, receive a prescription from a provider. Don't delay: Treatment must be started within the first few days to be effective. If you have a fever, cough, or other symptoms, you might have COVID-19. Most people have mild illness and are able to recover at home. If you are sick: Keep track of your symptoms. If you have an emergency warning sign (including trouble breathing), call 911. Steps to help prevent the spread of COVID-19 if you are sick If you are sick with COVID-19 or think you might have COVID-19, follow the steps below to care for yourself and to help protect other people in your home and community. Stay home except to get medical care Stay home. Most people with COVID-19 have mild illness and can recover at home without medical care. Do not leave your home, except to get medical care. Do not visit public  areas and do not go to places where you are unable to wear a mask. Take care of yourself. Get rest and stay hydrated. Take over-the-counter medicines, such as acetaminophen, to help you feel better. Stay in touch with your doctor. Call before you get medical care. Be sure to get care if you have trouble breathing, or have any other emergency warning signs, or if you think it is an emergency. Avoid public transportation, ride-sharing, or taxis if possible. Get tested If you have symptoms of COVID-19, get tested. While waiting for test results, stay away from others, including staying apart from those living in your household. Get tested as soon as possible after your  symptoms start. Treatments may be available for people with COVID-19 who are at risk for becoming very sick. Don't delay: Treatment must be started early to be effective--some treatments must begin within 5 days of your first symptoms. Contact your healthcare provider right away if your test result is positive to determine if you are eligible. Self-tests are one of several options for testing for the virus that causes COVID-19 and may be more convenient than laboratory-based tests and point-of-care tests. Ask your healthcare provider or your local health department if you need help interpreting your test results. You can visit your state, tribal, local, and territorial health department's website to look for the latest local information on testing sites. Separate yourself from other people As much as possible, stay in a specific room and away from other people and pets in your home. If possible, you should use a separate bathroom. If you need to be around other people or animals in or outside of the home, wear a well-fitting mask. Tell your close contacts that they may have been exposed to COVID-19. An infected person can spread COVID-19 starting 48 hours (or 2 days) before the person has any symptoms or tests positive. By letting your close  contacts know they may have been exposed to COVID-19, you are helping to protect everyone. See COVID-19 and Animals if you have questions about pets. If you are diagnosed with COVID-19, someone from the health department may call you. Answer the call to slow the spread. Monitor your symptoms Symptoms of COVID-19 include fever, cough, or other symptoms. Follow care instructions from your healthcare provider and local health department. Your local health authorities may give instructions on checking your symptoms and reporting information. When to seek emergency medical attention Look for emergency warning signs* for COVID-19. If someone is showing any of these signs, seek emergency medical care immediately: Trouble breathing Persistent pain or pressure in the chest New confusion Inability to wake or stay awake Pale, gray, or blue-colored skin, lips, or nail beds, depending on skin tone *This list is not all possible symptoms. Please call your medical provider for any other symptoms that are severe or concerning to you. Call 911 or call ahead to your local emergency facility: Notify the operator that you are seeking care for someone who has or may have COVID-19. Call ahead before visiting your doctor Call ahead. Many medical visits for routine care are being postponed or done by phone or telemedicine. If you have a medical appointment that cannot be postponed, call your doctor's office, and tell them you have or may have COVID-19. This will help the office protect themselves and other patients. If you are sick, wear a well-fitting mask You should wear a mask if you must be around other people or animals, including pets (even at home). Wear a mask with the best fit, protection, and comfort for you. You don't need to wear the mask if you are alone. If you can't put on a mask (because of trouble breathing, for example), cover your coughs and sneezes in some other way. Try to stay at least 6 feet away  from other people. This will help protect the people around you. Masks should not be placed on young children under age 20 years, anyone who has trouble breathing, or anyone who is not able to remove the mask without help. Cover your coughs and sneezes Cover your mouth and nose with a tissue when you cough or sneeze. Throw away used tissues in a lined  trash can. Immediately wash your hands with soap and water for at least 20 seconds. If soap and water are not available, clean your hands with an alcohol-based hand sanitizer that contains at least 60% alcohol. Clean your hands often Wash your hands often with soap and water for at least 20 seconds. This is especially important after blowing your nose, coughing, or sneezing; going to the bathroom; and before eating or preparing food. Use hand sanitizer if soap and water are not available. Use an alcohol-based hand sanitizer with at least 60% alcohol, covering all surfaces of your hands and rubbing them together until they feel dry. Soap and water are the best option, especially if hands are visibly dirty. Avoid touching your eyes, nose, and mouth with unwashed hands. Handwashing Tips Avoid sharing personal household items Do not share dishes, drinking glasses, cups, eating utensils, towels, or bedding with other people in your home. Wash these items thoroughly after using them with soap and water or put in the dishwasher. Clean surfaces in your home regularly Clean and disinfect high-touch surfaces (for example, doorknobs, tables, handles, light switches, and countertops) in your "sick room" and bathroom. In shared spaces, you should clean and disinfect surfaces and items after each use by the person who is ill. If you are sick and cannot clean, a caregiver or other person should only clean and disinfect the area around you (such as your bedroom and bathroom) on an as needed basis. Your caregiver/other person should wait as long as possible (at least  several hours) and wear a mask before entering, cleaning, and disinfecting shared spaces that you use. Clean and disinfect areas that may have blood, stool, or body fluids on them. Use household cleaners and disinfectants. Clean visible dirty surfaces with household cleaners containing soap or detergent. Then, use a household disinfectant. Use a product from [employer] List N: Disinfectants for Coronavirus (COVID-19). Be sure to follow the instructions on the label to ensure safe and effective use of the product. Many products recommend keeping the surface wet with a disinfectant for a certain period of time (look at "contact time" on the product label). You may also need to wear personal protective equipment, such as gloves, depending on the directions on the product label. Immediately after disinfecting, wash your hands with soap and water for 20 seconds. For completed guidance on cleaning and disinfecting your home, visit Complete Disinfection Guidance. Take steps to improve ventilation at home Improve ventilation (air flow) at home to help prevent from spreading COVID-19 to other people in your household. Clear out COVID-19 virus particles in the air by opening windows, using air filters, and turning on fans in your home. Use this interactive tool to learn how to improve air flow in your home. When you can be around others after being sick with COVID-19 Deciding when you can be around others is different for different situations. Find out when you can safely end home isolation. For any additional questions about your care, contact your healthcare provider or state or local health department. 05/25/2020 Content source: [facility] ([facility]), Division of Viral Diseases This information is not intended to replace advice given to you by your health care provider. Make sure you discuss any questions you have with your health care provider. Document Revised:  07/08/2020 Document Reviewed: 07/08/2020 Elsevier Patient Education  2022 [employer].     If you have been instructed to have an in-person evaluation today at a local Urgent Care facility,  please use the link below. It will take you to a list of all of our available [facility], including address, phone number and hours of operation. Please do not delay care.  [facility]  If you or a family member do not have a primary care provider, use the link below to schedule a visit and establish care. When you choose a [facility] primary care physician or advanced practice provider, you gain a long-term partner in health. Find a Primary Care Provider  Learn more about [facility] in-office and virtual care options:  - Get Care Now

## 2022-11-12 ENCOUNTER — Encounter: Payer: Self-pay | Admitting: Family Medicine

## 2022-11-12 DIAGNOSIS — H9190 Unspecified hearing loss, unspecified ear: Secondary | ICD-10-CM | POA: Insufficient documentation

## 2022-11-12 NOTE — Assessment & Plan Note (Signed)
Family concerned about hearing loss, refer for eval

## 2022-12-13 ENCOUNTER — Other Ambulatory Visit: Payer: Self-pay

## 2022-12-13 DIAGNOSIS — H9190 Unspecified hearing loss, unspecified ear: Secondary | ICD-10-CM

## 2022-12-13 DIAGNOSIS — M25551 Pain in right hip: Secondary | ICD-10-CM

## 2022-12-20 ENCOUNTER — Ambulatory Visit: Payer: 59 | Admitting: Family Medicine

## 2023-02-06 ENCOUNTER — Encounter: Payer: Self-pay | Admitting: Family Medicine

## 2023-02-06 ENCOUNTER — Telehealth: Payer: 59 | Admitting: Family Medicine

## 2023-02-06 VITALS — BP 139/87 | HR 64 | Wt 238.0 lb

## 2023-02-06 DIAGNOSIS — R030 Elevated blood-pressure reading, without diagnosis of hypertension: Secondary | ICD-10-CM | POA: Insufficient documentation

## 2023-02-06 DIAGNOSIS — R7303 Prediabetes: Secondary | ICD-10-CM | POA: Diagnosis not present

## 2023-02-06 DIAGNOSIS — E785 Hyperlipidemia, unspecified: Secondary | ICD-10-CM

## 2023-02-06 DIAGNOSIS — E559 Vitamin D deficiency, unspecified: Secondary | ICD-10-CM

## 2023-02-06 DIAGNOSIS — Z6837 Body mass index (BMI) 37.0-37.9, adult: Secondary | ICD-10-CM

## 2023-02-06 DIAGNOSIS — H9190 Unspecified hearing loss, unspecified ear: Secondary | ICD-10-CM

## 2023-02-06 DIAGNOSIS — F411 Generalized anxiety disorder: Secondary | ICD-10-CM

## 2023-02-06 MED ORDER — LORATADINE 10 MG PO TABS: ORAL_TABLET | ORAL | 1 refills | Status: DC

## 2023-02-06 NOTE — Assessment & Plan Note (Signed)
DASH diet and commitment to daily physical activity for a minimum of 30 minutes discussed and encouraged, as a part of hypertension management. The importance of attaining a healthy weight is also discussed.     02/06/2023    9:21 AM 02/06/2023    9:10 AM 11/07/2022    8:53 AM 11/02/2022    1:46 PM 08/15/2022   11:30 AM 08/08/2022    9:16 AM 06/30/2022    9:23 AM  BP/Weight  Systolic BP 139 348 130 133 126 124 116  Diastolic BP 87 94 84 79 80 81 70  Wt. (Lbs)  238 223.12  256.08  263  BMI  37.28 kg/m2 34.95 kg/m2  40.11 kg/m2  41.19 kg/m2     Needs in office evaluation

## 2023-02-06 NOTE — Assessment & Plan Note (Signed)
Controlled, no change in medication  

## 2023-02-06 NOTE — Progress Notes (Signed)
Virtual Visit via Video Note  I connected with Jamie Frost on 02/06/23 at  9:00 AM EST by a video enabled telemedicine application and verified that I am speaking with the correct person using two identifiers.  Location: Patient: home Provider: officew    I discussed the limitations of evaluation and management by telemedicine and the availability of in person appointments. The patient expressed understanding and agreed to proceed.  History of Present Illness: F/u chronic problems I still need a  n appointment for hearing  Overeating and weight gain are a problem I will come for labs next week    Observations/Objective: BP 139/87   Pulse 64   Wt 238 lb (108 kg)   BMI 37.28 kg/m  Good communication with no confusion and intact memory. Alert and oriented x 3 No signs of respiratory distress during speech   Assessment and Plan:  Hyperlipidemia LDL goal <100 Hyperlipidemia:Low fat diet discussed and encouraged.   Lipid Panel  Lab Results  Component Value Date   CHOL 140 10/06/2022   HDL 72 10/06/2022   LDLCALC 58 10/06/2022   TRIG 41 10/06/2022   CHOLHDL 1.9 10/06/2022     Updated lab needed at/ before next visit.   Morbid obesity (HCC) Deteriorated  Patient re-educated about  the importance of commitment to a  minimum of 150 minutes of exercise per week as able.  The importance of healthy food choices with portion control discussed, as well as eating regularly and within a 12 hour window most days. The need to choose "clean , green" food 50 to 75% of the time is discussed, as well as to make water the primary drink and set a goal of 64 ounces water daily.       02/06/2023    9:10 AM 11/07/2022    8:53 AM 08/15/2022   11:30 AM  Weight /BMI  Weight 238 lb 223 lb 1.9 oz 256 lb 1.3 oz  Height  5\' 7"  (1.702 m) 5\' 7"  (1.702 m)  BMI 37.28 kg/m2 34.95 kg/m2 40.11 kg/m2      Vitamin D deficiency Updated lab needed at/ before next  visit.   Prediabetes Patient educated about the importance of limiting  Carbohydrate intake , the need to commit to daily physical activity for a minimum of 30 minutes , and to commit weight loss. The fact that changes in all these areas will reduce or eliminate all together the development of diabetes is stressed.      Latest Ref Rng & Units 10/06/2022    8:32 AM 03/31/2022   11:30 AM 08/30/2021   10:30 AM 12/23/2020    4:36 PM 06/23/2020   11:00 AM  Diabetic Labs  HbA1c 4.8 - 5.6 % 5.8  5.7  5.7  5.7  5.7   Chol 100 - 199 mg/dL 086  578  469  629    HDL >39 mg/dL 72  62  72  71    Calc LDL 0 - 99 mg/dL 58  57  74  85    Triglycerides 0 - 149 mg/dL 41  46  35  50    Creatinine 0.76 - 1.27 mg/dL 5.28  4.13  2.44  0.10        02/06/2023    9:21 AM 02/06/2023    9:10 AM 11/07/2022    8:53 AM 11/02/2022    1:46 PM 08/15/2022   11:30 AM 08/08/2022    9:16 AM 06/30/2022    9:23 AM  BP/Weight  Systolic BP 139 348 130 133 126 124 116  Diastolic BP 87 94 84 79 80 81 70  Wt. (Lbs)  238 223.12  256.08  263  BMI  37.28 kg/m2 34.95 kg/m2  40.11 kg/m2  41.19 kg/m2      01/18/2016   10:45 AM 09/30/2014    2:45 PM  Foot/eye exam completion dates  Foot Form Completion Done Done    Updated lab needed at/ before next visit.   Hearing reduced Neds appt scheduled  Generalized anxiety disorder Controlled, no change in medication   Elevated blood pressure reading DASH diet and commitment to daily physical activity for a minimum of 30 minutes discussed and encouraged, as a part of hypertension management. The importance of attaining a healthy weight is also discussed.     02/06/2023    9:21 AM 02/06/2023    9:10 AM 11/07/2022    8:53 AM 11/02/2022    1:46 PM 08/15/2022   11:30 AM 08/08/2022    9:16 AM 06/30/2022    9:23 AM  BP/Weight  Systolic BP 139 348 130 133 126 124 116  Diastolic BP 87 94 84 79 80 81 70  Wt. (Lbs)  238 223.12  256.08  263  BMI  37.28 kg/m2 34.95 kg/m2  40.11 kg/m2   41.19 kg/m2     Needs in office evaluation  Follow Up Instructions:    I discussed the assessment and treatment plan with the patient. The patient was provided an opportunity to ask questions and all were answered. The patient agreed with the plan and demonstrated an understanding of the instructions.   The patient was advised to call back or seek an in-person evaluation if the symptoms worsen or if the condition fails to improve as anticipated.  I provided 15 minutes of non-face-to-face time during this encounter.   Syliva Overman, MD

## 2023-02-06 NOTE — Patient Instructions (Signed)
F/U in office visit re evaluate weight and blood pressure end January call if you need me sooner  I will reach out to get info on your ENT eval for hearing , you should hear from referral staff with appt info by the end of the week, if niot , please send a message  Please work on weight loss, you are reporting 15 pound weight gain and uncontrolled blood pressure  Thanks for choosing [facility], we consider it a privelige to serve you.

## 2023-02-06 NOTE — Assessment & Plan Note (Signed)
Hyperlipidemia:Low fat diet discussed and encouraged.   Lipid Panel  Lab Results  Component Value Date   CHOL 140 10/06/2022   HDL 72 10/06/2022   LDLCALC 58 10/06/2022   TRIG 41 10/06/2022   CHOLHDL 1.9 10/06/2022     Updated lab needed at/ before next visit.

## 2023-02-06 NOTE — Assessment & Plan Note (Signed)
Updated lab needed at/ before next visit.   

## 2023-02-06 NOTE — Assessment & Plan Note (Signed)
Patient educated about the importance of limiting  Carbohydrate intake , the need to commit to daily physical activity for a minimum of 30 minutes , and to commit weight loss. The fact that changes in all these areas will reduce or eliminate all together the development of diabetes is stressed.      Latest Ref Rng & Units 10/06/2022    8:32 AM 03/31/2022   11:30 AM 08/30/2021   10:30 AM 12/23/2020    4:36 PM 06/23/2020   11:00 AM  Diabetic Labs  HbA1c 4.8 - 5.6 % 5.8  5.7  5.7  5.7  5.7   Chol 100 - 199 mg/dL 540  981  191  478    HDL >39 mg/dL 72  62  72  71    Calc LDL 0 - 99 mg/dL 58  57  74  85    Triglycerides 0 - 149 mg/dL 41  46  35  50    Creatinine 0.76 - 1.27 mg/dL 2.95  6.21  3.08  6.57        02/06/2023    9:21 AM 02/06/2023    9:10 AM 11/07/2022    8:53 AM 11/02/2022    1:46 PM 08/15/2022   11:30 AM 08/08/2022    9:16 AM 06/30/2022    9:23 AM  BP/Weight  Systolic BP 139 348 130 133 126 124 116  Diastolic BP 87 94 84 79 80 81 70  Wt. (Lbs)  238 223.12  256.08  263  BMI  37.28 kg/m2 34.95 kg/m2  40.11 kg/m2  41.19 kg/m2      01/18/2016   10:45 AM 09/30/2014    2:45 PM  Foot/eye exam completion dates  Foot Form Completion Done Done    Updated lab needed at/ before next visit.

## 2023-02-06 NOTE — Assessment & Plan Note (Signed)
Neds appt scheduled

## 2023-02-06 NOTE — Assessment & Plan Note (Signed)
Deteriorated  Patient re-educated about  the importance of commitment to a  minimum of 150 minutes of exercise per week as able.  The importance of healthy food choices with portion control discussed, as well as eating regularly and within a 12 hour window most days. The need to choose "clean , green" food 50 to 75% of the time is discussed, as well as to make water the primary drink and set a goal of 64 ounces water daily.       02/06/2023    9:10 AM 11/07/2022    8:53 AM 08/15/2022   11:30 AM  Weight /BMI  Weight 238 lb 223 lb 1.9 oz 256 lb 1.3 oz  Height  5\' 7"  (1.702 m) 5\' 7"  (1.702 m)  BMI 37.28 kg/m2 34.95 kg/m2 40.11 kg/m2

## 2023-02-07 ENCOUNTER — Encounter: Payer: Self-pay | Admitting: Family Medicine

## 2023-02-07 MED ORDER — MONTELUKAST SODIUM 10 MG PO TABS: 10.0000 mg | ORAL_TABLET | Freq: Every day | ORAL | 3 refills | Status: DC

## 2023-02-07 NOTE — Telephone Encounter (Signed)
Done

## 2023-02-07 NOTE — Addendum Note (Signed)
Addended by: Kerri Perches on: 02/07/2023 12:38 AM   Modules accepted: Orders

## 2023-03-16 ENCOUNTER — Ambulatory Visit: Payer: 59 | Attending: Family Medicine | Admitting: Audiologist

## 2023-03-16 DIAGNOSIS — H9193 Unspecified hearing loss, bilateral: Secondary | ICD-10-CM

## 2023-03-16 NOTE — Procedures (Signed)
  [facility] [address] [place], [place]  [address] [phone]  AUDIOLOGICAL  EVALUATION  NAME: Jamie Frost     DOB:   06/07/1975      MRN: [id number]                                                                                     DATE: 03/16/2023     REFERENT: Antonetta Rollene BRAVO, MD STATUS: Outpatient DIAGNOSIS: Decrease Hearing   History: Urho was seen for an audiological evaluation. Cousin accompanied Eliazer. His family feels he is not hearing well. Oron reports hearing well, he feels he has no difficulty. Abrar denies pain, pressure, or tinnitus  Azariel no history of hazardous noise exposure.  Medical history shows no risk for hearing loss.    Evaluation:  Otoscopy showed a clear view of the tympanic membranes, bilaterally Tympanometry results were consistent with normal middle ear function, bilaterally   Audiometric testing was completed using Conventional Audiometry techniques with insert earphones and supraural headphones. Test results are consistent with normal hearing bilaterally with 25dB at [facility] bilaterally. Speech Recognition Thresholds were obtained at 15 dB HL in the right ear and at 15dB HL in the left ear. Word Recognition Testing was completed at  40dB SL and [place] scored 100% on each ear.   Results:  The test results were reviewed with [name] and his cousin. Deniz has normal hearing in each ear.  Audiogram printed and provided to [employer].    Recommendations: No further testing is recommended at this time unless new concerns arise.    19 minutes spent testing and counseling on results.   If you have any questions please feel free to contact me at [phone].  Lauraine Ka Au.D.  Audiologist   03/16/2023  9:27 AM  Cc: Antonetta Rollene BRAVO, MD

## 2023-03-21 ENCOUNTER — Encounter: Payer: Self-pay | Admitting: Family Medicine

## 2023-03-21 DIAGNOSIS — L309 Dermatitis, unspecified: Secondary | ICD-10-CM

## 2023-03-27 ENCOUNTER — Other Ambulatory Visit: Payer: Self-pay

## 2023-03-27 DIAGNOSIS — L309 Dermatitis, unspecified: Secondary | ICD-10-CM

## 2023-03-27 NOTE — Telephone Encounter (Signed)
Referral sent 

## 2023-04-21 ENCOUNTER — Other Ambulatory Visit: Payer: Self-pay | Admitting: Family Medicine

## 2023-05-01 ENCOUNTER — Encounter: Payer: Self-pay | Admitting: Family Medicine

## 2023-05-16 ENCOUNTER — Ambulatory Visit (INDEPENDENT_AMBULATORY_CARE_PROVIDER_SITE_OTHER)

## 2023-05-16 VITALS — Ht 67.0 in | Wt 231.0 lb

## 2023-05-16 DIAGNOSIS — Z0000 Encounter for general adult medical examination without abnormal findings: Secondary | ICD-10-CM | POA: Diagnosis not present

## 2023-05-16 NOTE — Patient Instructions (Signed)
 Jamie Frost , Thank you for taking time to come for your Medicare Wellness Visit. I appreciate your ongoing commitment to your health goals. Please review the following plan we discussed and let me know if I can assist you in the future.   Referrals/Orders/Follow-Ups/Clinician Recommendations:  Next Medicare Annual Wellness Visit:   May 20, 2024 at 3:50 pm video visit.   This is a list of the screening recommended for you and due dates:  Health Maintenance  Topic Date Due   DTaP/Tdap/Td vaccine (3 - Td or Tdap) 12/29/2020   COVID-19 Vaccine (3 - 2024-25 season) 11/05/2022   Medicare Annual Wellness Visit  05/15/2024   Colon Cancer Screening  03/18/2031   Flu Shot  Completed   Hepatitis C Screening  Completed   HIV Screening  Completed   Pneumococcal Vaccination  Aged Out   HPV Vaccine  Aged Out    Advanced directives: (Declined) Advance directive discussed with you today. Even though you declined this today, please call our office should you change your mind, and we can give you the proper paperwork for you to fill out.  Next Medicare Annual Wellness Visit scheduled for next year: yes  Understanding Your Risk for Falls Millions of people have serious injuries from falls each year. It is important to understand your risk of falling. Talk with your health care provider about your risk and what you can do to lower it. If you do have a serious fall, make sure to tell your provider. Falling once raises your risk of falling again. How can falls affect me? Serious injuries from falls are common. These include: Broken bones, such as hip fractures. Head injuries, such as traumatic brain injuries (TBI) or concussions. A fear of falling can cause you to avoid activities and stay at home. This can make your muscles weaker and raise your risk for a fall. What can increase my risk? There are a number of risk factors that increase your risk for falling. The more risk factors you have, the higher  your risk of falling. Serious injuries from a fall happen most often to people who are older than 49 years old. Teenagers and young adults ages 76-29 are also at higher risk. Common risk factors include: Weakness in the lower body. Being generally weak or confused due to long-term (chronic) illness. Dizziness or balance problems. Poor vision. Medicines that cause dizziness or drowsiness. These may include: Medicines for your blood pressure, heart, anxiety, insomnia, or swelling (edema). Pain medicines. Muscle relaxants. Other risk factors include: Drinking alcohol. Having had a fall in the past. Having foot pain or wearing improper footwear. Working at a dangerous job. Having any of the following in your home: Tripping hazards, such as floor clutter or loose rugs. Poor lighting. Pets. Having dementia or memory loss. What actions can I take to lower my risk of falling?     Physical activity Stay physically fit. Do strength and balance exercises. Consider taking a regular class to build strength and balance. Yoga and tai chi are good options. Vision Have your eyes checked every year and your prescription for glasses or contacts updated as needed. Shoes and walking aids Wear non-skid shoes. Wear shoes that have rubber soles and low heels. Do not wear high heels. Do not walk around the house in socks or slippers. Use a cane or walker as told by your provider. Home safety Attach secure railings on both sides of your stairs. Install grab bars for your bathtub, shower, and toilet.  Use a non-skid mat in your bathtub or shower. Attach bath mats securely with double-sided, non-slip rug tape. Use good lighting in all rooms. Keep a flashlight near your bed. Make sure there is a clear path from your bed to the bathroom. Use night-lights. Do not use throw rugs. Make sure all carpeting is taped or tacked down securely. Remove all clutter from walkways and stairways, including extension  cords. Repair uneven or broken steps and floors. Avoid walking on icy or slippery surfaces. Walk on the grass instead of on icy or slick sidewalks. Use ice melter to get rid of ice on walkways in the winter. Use a cordless phone. Questions to ask your health care provider Can you help me check my risk for a fall? Do any of my medicines make me more likely to fall? Should I take a vitamin D supplement? What exercises can I do to improve my strength and balance? Should I make an appointment to have my vision checked? Do I need a bone density test to check for weak bones (osteoporosis)? Would it help to use a cane or a walker? Where to find more information Centers for Disease Control and Prevention, [employer]: TonerPromos.no Community-Based Fall Prevention Programs: TonerPromos.no [employer] on Aging: BaseRingTones.pl Contact a health care provider if: You fall at home. You are afraid of falling at home. You feel weak, drowsy, or dizzy. This information is not intended to replace advice given to you by your health care provider. Make sure you discuss any questions you have with your health care provider. Document Revised: 10/24/2021 Document Reviewed: 10/24/2021 Elsevier Patient Education  2024 [employer].

## 2023-05-16 NOTE — Progress Notes (Signed)
 Because this visit was a virtual/telehealth visit,  certain criteria was not obtained, such a blood pressure, CBG if applicable, and timed get up and go. Any medications not marked as "taking" were not mentioned during the medication reconciliation part of the visit. Any vitals not documented were not able to be obtained due to this being a telehealth visit or patient was unable to self-report a recent blood pressure reading due to a lack of equipment at home via telehealth. Vitals that have been documented are verbally provided by the patient.   Subjective:   Jamie Frost is a 48 y.o. who presents for a Medicare Wellness preventive visit.  Visit Complete: Virtual I connected with  [name] on 05/16/23 by a audio enabled telemedicine application and verified that I am speaking with the correct person using two identifiers.  Patient Location: Home  Provider Location: Home Office  I discussed the limitations of evaluation and management by telemedicine. The patient expressed understanding and agreed to proceed.  Vital Signs: Because this visit was a virtual/telehealth visit, some criteria may be missing or patient reported. Any vitals not documented were not able to be obtained and vitals that have been documented are patient reported.  VideoDeclined- This patient declined Librarian, academic. Therefore the visit was completed with audio only.  AWV Questionnaire: No: Patient Medicare AWV questionnaire was not completed prior to this visit.  Cardiac Risk Factors include: advanced age (>56men, >39 women);sedentary lifestyle;obesity (BMI >30kg/m2);male gender;hypertension;dyslipidemia     Objective:    Today's Vitals   05/16/23 1505  Weight: 231 lb (104.8 kg)  Height: 5\' 7"  (1.702 m)   Body mass index is 36.18 kg/m.     05/16/2023    3:11 PM 02/14/2022   11:36 AM 05/30/2021   12:41 PM 03/17/2021    6:53 AM 03/15/2021   10:02 AM 01/03/2021     1:22 PM 06/23/2020    9:59 AM  Advanced Directives  Does Patient Have a Medical Advance Directive? No No No No No No No  Would patient like information on creating a medical advance directive? No - Patient declined No - Patient declined No - Patient declined No - Patient declined No - Patient declined No - Patient declined Yes (MAU/Ambulatory/Procedural Areas - Information given)    Current Medications (verified) Outpatient Encounter Medications as of 05/16/2023  Medication Sig   acetaminophen (TYLENOL) 500 MG tablet Take one tablet at bedtime as needed, for pain   busPIRone (BUSPAR) 7.5 MG tablet TAKE 1 TABLET BY MOUTH TWICE  DAILY   ferrous sulfate 325 (65 FE) MG tablet Take 325 mg by mouth daily with breakfast.   fluticasone (FLONASE) 50 MCG/ACT nasal spray Place 2 sprays into both nostrils daily.   ketoconazole (NIZORAL) 2 % shampoo Apply 1 Application topically 2 (two) times a week.   montelukast (SINGULAIR) 10 MG tablet Take 1 tablet (10 mg total) by mouth at bedtime.   simvastatin (ZOCOR) 40 MG tablet TAKE 1 TABLET BY MOUTH AT  BEDTIME   UNABLE TO FIND Med Name: Cane x 1 Dx: Z61.09   No facility-administered encounter medications on file as of 05/16/2023.    Allergies (verified) Sulfonamide derivatives   History: Past Medical History:  Diagnosis Date   Allergic rhinitis, seasonal    Ambulates with cane    straight   Arthritis    Diabetes mellitus without complication (HCC)    type 2 - no meds   Hyperlipidemia    Hypertension  Impaired fasting glucose 12/09/2009   Obesity    Pulmonary hypoplasia    Right leg injury    wears brace LLE - per pt "foot turns over without brace"   Past Surgical History:  Procedure Laterality Date   ANKLE FUSION Left 08/09/2018   Procedure: LEFT TIBIO-CALCANEAL FUSION;  Surgeon: Nadara Mustard, MD;  Location: [facility] OR;  Service: Orthopedics;  Laterality: Left;   ANKLE FUSION Right 06/25/2020   Procedure: RIGHT TIBIOCALCANEAL FUSION;  Surgeon:  Nadara Mustard, MD;  Location: [facility] OR;  Service: Orthopedics;  Laterality: Right;   bilateral inguinal hernia     COLONOSCOPY WITH PROPOFOL N/A 03/17/2021   Procedure: COLONOSCOPY WITH PROPOFOL;  Surgeon: Corbin Ade, MD;  Location: [facility] SUITE;  Service: Endoscopy;  Laterality: N/A;  10:15am   I/D abcess on back  2007   1 WEEK    right great toe surgery     Family History  Problem Relation Age of Onset   Obesity Mother    Hypertension Mother    Arthritis Mother    Colon cancer Neg Hx    Colon polyps Neg Hx    Social History   Socioeconomic History   Marital status: Single    Spouse name: Not on file   Number of children: Not on file   Years of education: Not on file   Highest education level: 12th grade  Occupational History   Not on file  Tobacco Use   Smoking status: Never   Smokeless tobacco: Never  Vaping Use   Vaping status: Never Used  Substance and Sexual Activity   Alcohol use: No   Drug use: No   Sexual activity: Not Currently  Other Topics Concern   Not on file  Social History Narrative   He enjoys reading the bible during his free time.    Social Drivers of Corporate investment banker Strain: Low Risk  (05/16/2023)   Overall Financial Resource Strain (CARDIA)    Difficulty of Paying Living Expenses: Not hard at all  Food Insecurity: No Food Insecurity (05/16/2023)   Hunger Vital Sign    Worried About Running Out of Food in the Last Year: Never true    Ran Out of Food in the Last Year: Never true  Transportation Needs: No Transportation Needs (05/16/2023)   PRAPARE - Administrator, Civil Service (Medical): No    Lack of Transportation (Non-Medical): No  Physical Activity: Patient Declined (05/16/2023)   Exercise Vital Sign    Days of Exercise per Week: Patient declined    Minutes of Exercise per Session: Patient declined  Stress: No Stress Concern Present (05/16/2023)   [employer] of Occupational Health - Occupational Stress  Questionnaire    Feeling of Stress : Not at all  Social Connections: Socially Isolated (05/16/2023)   Social Connection and Isolation Panel [NHANES]    Frequency of Communication with Friends and Family: More than three times a week    Frequency of Social Gatherings with Friends and Family: More than three times a week    Attends Religious Services: Never    Database administrator or Organizations: No    Attends Engineer, structural: Never    Marital Status: Never married    Tobacco Counseling Counseling given: Yes    Clinical Intake:  Pre-visit preparation completed: Yes  Pain : No/denies pain     BMI - recorded: 36.18 Nutritional Status: BMI > 30  Obese Nutritional Risks: None  Diabetes: No  How often do you need to have someone help you when you read instructions, pamphlets, or other written materials from your doctor or pharmacy?: 3 - Sometimes  Interpreter Needed?: No  Information entered by :: Maryjean Ka CMA   Activities of Daily Living     05/16/2023    3:09 PM  In your present state of health, do you have any difficulty performing the following activities:  Hearing? 1  Comment caregivers state he has difficulty however, when patient was tested they didn't recommend hearing aids.  Vision? 0  Comment [facility] [place]  Difficulty concentrating or making decisions? 1  Walking or climbing stairs? 1  Comment uses a cane  Dressing or bathing? 0  Doing errands, shopping? 1  Preparing Food and eating ? Y  Using the Toilet? N  In the past six months, have you accidently leaked urine? N  Do you have problems with loss of bowel control? N  Managing your Medications? Y  Managing your Finances? Y  Housekeeping or managing your Housekeeping? Y    Patient Care Team: Kerri Perches, MD as PCP - General  Indicate any recent Medical Services you may have received from other than [facility] providers in the past year (date may be approximate).      Assessment:   This is a routine wellness examination for Tarvaris.  Hearing/Vision screen Hearing Screening - Comments:: Caregivers state he has difficulty hearing from what he present at home, however, when he had a hearing test, they didn't find anything wrong and did not recommend hearing aids.  Vision Screening - Comments:: Wears rx glasses - up to date with routine eye exams  Patient sees [facility] in [place]   Goals Addressed             This Visit's Progress    Patient Stated       To stay healthy and well.        Depression Screen     05/16/2023    3:13 PM 11/07/2022    8:55 AM 08/15/2022   11:31 AM 04/04/2022   11:34 AM 02/14/2022   11:36 AM 02/14/2022   11:35 AM 02/14/2022   11:34 AM  PHQ 2/9 Scores  PHQ - 2 Score 0 0 2 0 0 0 0  PHQ- 9 Score   6        Fall Risk     05/16/2023    3:12 PM 11/07/2022    8:55 AM 08/15/2022   11:31 AM 06/16/2022   10:03 AM 04/04/2022   11:34 AM  Fall Risk   Falls in the past year? 1 0 1 1 1   Number falls in past yr: 1 0 1 1 0  Injury with Fall? 0 0 1 1 1   Risk for fall due to : History of fall(s);Impaired balance/gait;Orthopedic patient;Impaired mobility No Fall Risks History of fall(s);Impaired balance/gait Impaired balance/gait History of fall(s);Impaired balance/gait  Follow up Falls prevention discussed;Education provided Falls evaluation completed Falls evaluation completed  Falls evaluation completed    MEDICARE RISK AT HOME:  Medicare Risk at Home Any stairs in or around the home?: No If so, are there any without handrails?: No Home free of loose throw rugs in walkways, pet beds, electrical cords, etc?: Yes Adequate lighting in your home to reduce risk of falls?: Yes Life alert?: No Use of a cane, walker or w/c?: Yes Grab bars in the bathroom?: Yes Shower chair or bench in shower?: Yes  Elevated toilet seat or a handicapped toilet?: Yes  TIMED UP AND GO:  Was the test performed?  No  Cognitive Function: Unable  to complete    05/16/2023    3:13 PM 01/03/2021    1:23 PM 08/20/2017   10:43 AM  MMSE - Mini Mental State Exam  Not completed: Unable to complete Unable to complete   Orientation to time   5  Orientation to Place   5  Registration   3  Attention/ Calculation   5  Recall   2  Language- name 2 objects   2  Language- repeat   1  Language- follow 3 step command   3  Language- read & follow direction   1  Write a sentence   1  Copy design   0  Total score   28        02/14/2022   11:37 AM 01/03/2021    1:23 PM 12/15/2019   10:30 AM 08/22/2018    3:25 PM 08/20/2017   10:29 AM  6CIT Screen  What Year? 0 points 0 points 0 points 0 points 0 points  What month? 0 points 0 points 0 points 0 points 0 points  What time? 0 points 0 points 0 points 0 points 0 points  Count back from 20 2 points 4 points 4 points 2 points 4 points  Months in reverse 2 points 2 points 4 points 4 points 4 points  Repeat phrase 0 points 0 points 0 points 2 points 4 points  Total Score 4 points 6 points 8 points 8 points 12 points    Immunizations Immunization History  Administered Date(s) Administered   Influenza Split 12/30/2010, 11/29/2011   Influenza Whole 01/19/2007, 12/24/2008, 11/19/2009   Influenza, Seasonal, Injecte, Preservative Fre 11/07/2022   Influenza,inj,Quad PF,6+ Mos 12/16/2012, 12/17/2013, 12/21/2014, 12/20/2015, 12/12/2016, 01/01/2018, 12/30/2018, 04/07/2020, 12/23/2020, 12/27/2021   Moderna Sars-Covid-2 Vaccination 05/29/2019, 06/26/2019   PNEUMOCOCCAL CONJUGATE-20 12/23/2020   Pneumococcal Polysaccharide-23 07/01/2010, 09/15/2015   Td 12/10/2002   Tdap 12/30/2010    Screening Tests Health Maintenance  Topic Date Due   DTaP/Tdap/Td (3 - Td or Tdap) 12/29/2020   COVID-19 Vaccine (3 - 2024-25 season) 11/05/2022   Medicare Annual Wellness (AWV)  05/15/2024   Colonoscopy  03/18/2031   INFLUENZA VACCINE  Completed   Hepatitis C Screening  Completed   HIV Screening  Completed    Pneumococcal Vaccine 34-106 Years old  Aged Out   HPV VACCINES  Aged Out    Health Maintenance  Health Maintenance Due  Topic Date Due   DTaP/Tdap/Td (3 - Td or Tdap) 12/29/2020   COVID-19 Vaccine (3 - 2024-25 season) 11/05/2022   Health Maintenance Items Addressed: Health Maintenance is up to date.   Additional Screening:  Vision Screening: Recommended annual ophthalmology exams for early detection of glaucoma and other disorders of the eye.  Dental Screening: Recommended annual dental exams for proper oral hygiene  Community Resource Referral / Chronic Care Management: CRR required this visit?  No   CCM required this visit?  No     Plan:     I have personally reviewed and noted the following in the patient's chart:   Medical and social history Use of alcohol, tobacco or illicit drugs  Current medications and supplements including opioid prescriptions. Patient is not currently taking opioid prescriptions. Functional ability and status Nutritional status Physical activity Advanced directives List of other physicians Hospitalizations, surgeries, and ER visits in previous 12 months Vitals Screenings to  include cognitive, depression, and falls Referrals and appointments  In addition, I have reviewed and discussed with patient certain preventive protocols, quality metrics, and best practice recommendations. A written personalized care plan for preventive services as well as general preventive health recommendations were provided to patient.     Jordan Hawks Kobie Matkins, CMA   05/16/2023   After Visit Summary: (MyChart) Due to this being a telephonic visit, the after visit summary with patients personalized plan was offered to patient via MyChart   Notes: Nothing significant to report at this time.

## 2023-05-23 LAB — CBC
Hematocrit: 39.5 % (ref 37.5–51.0)
Hemoglobin: 11.9 g/dL — ABNORMAL LOW (ref 13.0–17.7)
MCH: 26.7 pg (ref 26.6–33.0)
MCHC: 30.1 g/dL — ABNORMAL LOW (ref 31.5–35.7)
MCV: 89 fL (ref 79–97)
Platelets: 217 10*3/uL (ref 150–450)
RBC: 4.46 x10E6/uL (ref 4.14–5.80)
RDW: 14 % (ref 11.6–15.4)
WBC: 7.2 10*3/uL (ref 3.4–10.8)

## 2023-05-23 LAB — BMP8+EGFR
BUN/Creatinine Ratio: 17 (ref 9–20)
BUN: 16 mg/dL (ref 6–24)
CO2: 24 mmol/L (ref 20–29)
Calcium: 9.5 mg/dL (ref 8.7–10.2)
Chloride: 101 mmol/L (ref 96–106)
Creatinine, Ser: 0.93 mg/dL (ref 0.76–1.27)
Glucose: 80 mg/dL (ref 70–99)
Potassium: 4.6 mmol/L (ref 3.5–5.2)
Sodium: 140 mmol/L (ref 134–144)
eGFR: 101 mL/min/{1.73_m2} (ref >59)

## 2023-05-23 LAB — HEMOGLOBIN A1C
Est. average glucose Bld gHb Est-mCnc: 126 mg/dL
Hgb A1c MFr Bld: 6 % — ABNORMAL HIGH (ref 4.8–5.6)

## 2023-05-23 LAB — TSH: TSH: 1.51 u[IU]/mL (ref 0.450–4.500)

## 2023-06-16 ENCOUNTER — Other Ambulatory Visit: Payer: Self-pay | Admitting: Family Medicine

## 2023-07-24 ENCOUNTER — Ambulatory Visit (INDEPENDENT_AMBULATORY_CARE_PROVIDER_SITE_OTHER): Admitting: Family Medicine

## 2023-07-24 VITALS — BP 128/82 | HR 75 | Resp 18 | Ht 67.0 in | Wt 225.1 lb

## 2023-07-24 DIAGNOSIS — R7303 Prediabetes: Secondary | ICD-10-CM

## 2023-07-24 DIAGNOSIS — E785 Hyperlipidemia, unspecified: Secondary | ICD-10-CM | POA: Diagnosis not present

## 2023-07-24 DIAGNOSIS — Z125 Encounter for screening for malignant neoplasm of prostate: Secondary | ICD-10-CM | POA: Insufficient documentation

## 2023-07-24 DIAGNOSIS — B350 Tinea barbae and tinea capitis: Secondary | ICD-10-CM

## 2023-07-24 DIAGNOSIS — F411 Generalized anxiety disorder: Secondary | ICD-10-CM

## 2023-07-24 DIAGNOSIS — E559 Vitamin D deficiency, unspecified: Secondary | ICD-10-CM

## 2023-07-24 MED ORDER — KETOCONAZOLE 2 % EX SHAM: 1.0000 | MEDICATED_SHAMPOO | CUTANEOUS | 1 refills | Status: AC

## 2023-07-24 NOTE — Progress Notes (Signed)
 Jamie Frost     MRN: [id number]      DOB: [birth year]  Chief Complaint  Patient presents with   Medical Management of Chronic Issues    Follow up     HPI Jamie Frost is here for follow up and re-evaluation of chronic medical conditions, medication management and review of any available recent lab and radiology data.  Preventive health is updated, specifically  Cancer screening and Immunization.   Hearing eval is NORMAL.and urology eval for hematuria normal b ladder scan Jamie Frost denies any adverse reactions to current medications since Jamie last visit.  There are no new concerns.  Doing well has changed food choice with excelllent weight loss.  ROS Denies recent fever or chills. Denies sinus pressure, nasal congestion, ear pain or sore throat. Denies chest congestion, productive cough or wheezing. Denies chest pains, palpitations and leg swelling Denies abdominal pain, nausea, vomiting,diarrhea or constipation.   Denies dysuria, frequency, hesitancy or incontinence. Chronic  limitation in mobility. Denies headaches, seizures, numbness, or tingling. Denies depression,uncontrolled  anxiety or insomnia. Denies skin break down or rash.   PE  BP 128/82   Pulse 75   Resp 18   Ht 5\' 7"  (1.702 m)   Wt 225 lb 1.9 oz (102.1 kg)   SpO2 97%   BMI 35.26 kg/m   Patient alert and oriented and in no cardiopulmonary distress.  HEENT: No facial asymmetry, EOMI,     Neck supple .  Chest: Clear to auscultation bilaterally.  CVS: S1, S2 no murmurs, no S3.Regular rate.  ABD: Soft non tender.   Ext: No edema  MS: decreased  ROM spine, shoulders, hips and knees.  Skin: Intact, no ulcerations or rash noted.  Psych: Good eye contact, normal affect. not anxious or depressed appearing.  CNS: CN 2-12 intact, power,  normal throughout.no focal deficits noted.   Assessment & Plan  Hyperlipidemia LDL goal <100 Hyperlipidemia:Low fat diet discussed and encouraged.   Lipid  Panel  Lab Results  Component Value Date   CHOL 140 10/06/2022   HDL 72 10/06/2022   LDLCALC 58 10/06/2022   TRIG 41 10/06/2022   CHOLHDL 1.9 10/06/2022     Updated lab needed at/ before next visit.   Tinea capitis Refill ketoconazole  shampoo,  Vitamin D  deficiency Updated lab needed at/ before next visit.   Prediabetes Patient educated about Jamie importance of limiting  Carbohydrate intake , Jamie need to commit to daily physical activity for a minimum of 30 minutes , and to commit weight loss. Jamie fact that changes in all these areas will reduce or eliminate all together Jamie development of diabetes is stressed.  Updated lab needed at/ before next visit.      Latest Ref Rng & Units 05/22/2023    9:46 AM 10/06/2022    8:32 AM 03/31/2022   11:30 AM 08/30/2021   10:30 AM 12/23/2020    4:36 PM  Diabetic Labs  HbA1c 4.8 - 5.6 % 6.0  5.8  5.7  5.7  5.7   Chol 100 - 199 mg/dL  409  811  914  782   HDL >39 mg/dL  72  62  72  71   Calc LDL 0 - 99 mg/dL  58  57  74  85   Triglycerides 0 - 149 mg/dL  41  46  35  50   Creatinine 0.76 - 1.27 mg/dL 9.56  2.13  0.86  5.78  0.95  07/24/2023    9:52 AM 05/16/2023    3:05 PM 02/06/2023    9:21 AM 02/06/2023    9:10 AM 11/07/2022    8:53 AM 11/02/2022    1:46 PM 08/15/2022   11:30 AM  BP/Weight  Systolic BP 128 -- 139 348 130 133 126  Diastolic BP 82 -- 87 94 84 79 80  Wt. (Lbs) 225.12 231  238 223.12  256.08  BMI 35.26 kg/m2 36.18 kg/m2  37.28 kg/m2 34.95 kg/m2  40.11 kg/m2      01/18/2016   10:45 AM 09/30/2014    2:45 PM  Foot/eye exam completion dates  Foot Form Completion Done Done      Morbid obesity (HCC) IMPROVED  Patient re-educated about  Jamie importance of commitment to a  minimum of 150 minutes of exercise per week as able.  Jamie importance of healthy food choices with portion control discussed, as well as eating regularly and within a 12 hour window most days. Jamie need to choose "clean , green" food 50 to 75% of  Jamie time is discussed, as well as to make water Jamie primary drink and set a goal of 64 ounces water daily.       07/24/2023    9:52 AM 05/16/2023    3:05 PM 02/06/2023    9:10 AM  Weight /BMI  Weight 225 lb 1.9 oz 231 lb 238 lb  Height 5\' 7"  (1.702 m) 5\' 7"  (1.702 m)   BMI 35.26 kg/m2 36.18 kg/m2 37.28 kg/m2      Generalized anxiety disorder Controlled, no change in medication

## 2023-07-24 NOTE — Assessment & Plan Note (Signed)
 Updated lab needed at/ before next visit.

## 2023-07-24 NOTE — Patient Instructions (Addendum)
 Annual exam September 5 or after, call if you need me sooner.  Fasting labs  1 WEEK BEFORE SEPTEMEBR APPT PSA lipid CMP and EGFR vitamin D  CBC and HbA1c.  Please get your Tdap vaccine today on your way home if possible it is 2 years past you and important that you get this.  Continue chronic medications as before, ketoconazole  shampoo is refilled   It is important that you exercise regularly at least 30 minutes 5 times a week. If you develop chest pain, have severe difficulty breathing, or feel very tired, stop exercising immediately and seek medical attention    Think about what you will eat, plan ahead. Choose " clean, green, fresh or frozen" over canned, processed or packaged foods which are more sugary, salty and fatty. 70 to 75% of food eaten should be vegetables and fruit. Three meals at set times with snacks allowed between meals, but they must be fruit or vegetables. Aim to eat over a 12 hour period , example 7 am to 7 pm, and STOP after  your last meal of the day. Drink water,generally about 64 ounces per day, no other drink is as healthy. Fruit juice is best enjoyed in a healthy way, by EATING the fruit.  CONGRATS ON WEIGHT LOSS WITH CHANGE IN FOOD CHOICE, KEEP IT UP!  Thanks for choosing [facility], we consider it a privelige to serve you.

## 2023-07-24 NOTE — Assessment & Plan Note (Signed)
 Refill ketoconazole  shampoo,

## 2023-07-24 NOTE — Assessment & Plan Note (Signed)
 Controlled, no change in medication

## 2023-07-24 NOTE — Assessment & Plan Note (Signed)
 Patient educated about the importance of limiting  Carbohydrate intake , the need to commit to daily physical activity for a minimum of 30 minutes , and to commit weight loss. The fact that changes in all these areas will reduce or eliminate all together the development of diabetes is stressed.  Updated lab needed at/ before next visit.      Latest Ref Rng & Units 05/22/2023    9:46 AM 10/06/2022    8:32 AM 03/31/2022   11:30 AM 08/30/2021   10:30 AM 12/23/2020    4:36 PM  Diabetic Labs  HbA1c 4.8 - 5.6 % 6.0  5.8  5.7  5.7  5.7   Chol 100 - 199 mg/dL  409  811  914  782   HDL >39 mg/dL  72  62  72  71   Calc LDL 0 - 99 mg/dL  58  57  74  85   Triglycerides 0 - 149 mg/dL  41  46  35  50   Creatinine 0.76 - 1.27 mg/dL 9.56  2.13  0.86  5.78  0.95       07/24/2023    9:52 AM 05/16/2023    3:05 PM 02/06/2023    9:21 AM 02/06/2023    9:10 AM 11/07/2022    8:53 AM 11/02/2022    1:46 PM 08/15/2022   11:30 AM  BP/Weight  Systolic BP 128 -- 139 348 130 133 126  Diastolic BP 82 -- 87 94 84 79 80  Wt. (Lbs) 225.12 231  238 223.12  256.08  BMI 35.26 kg/m2 36.18 kg/m2  37.28 kg/m2 34.95 kg/m2  40.11 kg/m2      01/18/2016   10:45 AM 09/30/2014    2:45 PM  Foot/eye exam completion dates  Foot Form Completion Done Done

## 2023-07-24 NOTE — Assessment & Plan Note (Signed)
 IMPROVED  Patient re-educated about  the importance of commitment to a  minimum of 150 minutes of exercise per week as able.  The importance of healthy food choices with portion control discussed, as well as eating regularly and within a 12 hour window most days. The need to choose "clean , green" food 50 to 75% of the time is discussed, as well as to make water the primary drink and set a goal of 64 ounces water daily.       07/24/2023    9:52 AM 05/16/2023    3:05 PM 02/06/2023    9:10 AM  Weight /BMI  Weight 225 lb 1.9 oz 231 lb 238 lb  Height 5\' 7"  (1.702 m) 5\' 7"  (1.702 m)   BMI 35.26 kg/m2 36.18 kg/m2 37.28 kg/m2

## 2023-07-24 NOTE — Assessment & Plan Note (Signed)
 Hyperlipidemia:Low fat diet discussed and encouraged.   Lipid Panel  Lab Results  Component Value Date   CHOL 140 10/06/2022   HDL 72 10/06/2022   LDLCALC 58 10/06/2022   TRIG 41 10/06/2022   CHOLHDL 1.9 10/06/2022     Updated lab needed at/ before next visit.

## 2023-07-31 ENCOUNTER — Other Ambulatory Visit: Payer: Self-pay | Admitting: Family Medicine

## 2023-08-10 IMAGING — DX DG ANKLE COMPLETE 3+V*R*
3 series · 3 of 3 positions shown · non-contrast
Comparison: 09/23/2020

CLINICAL DATA: Right ankle pain after fall.  Prior surgery.

EXAM:
RIGHT ANKLE - COMPLETE 3+ VIEW

[ankle ap]
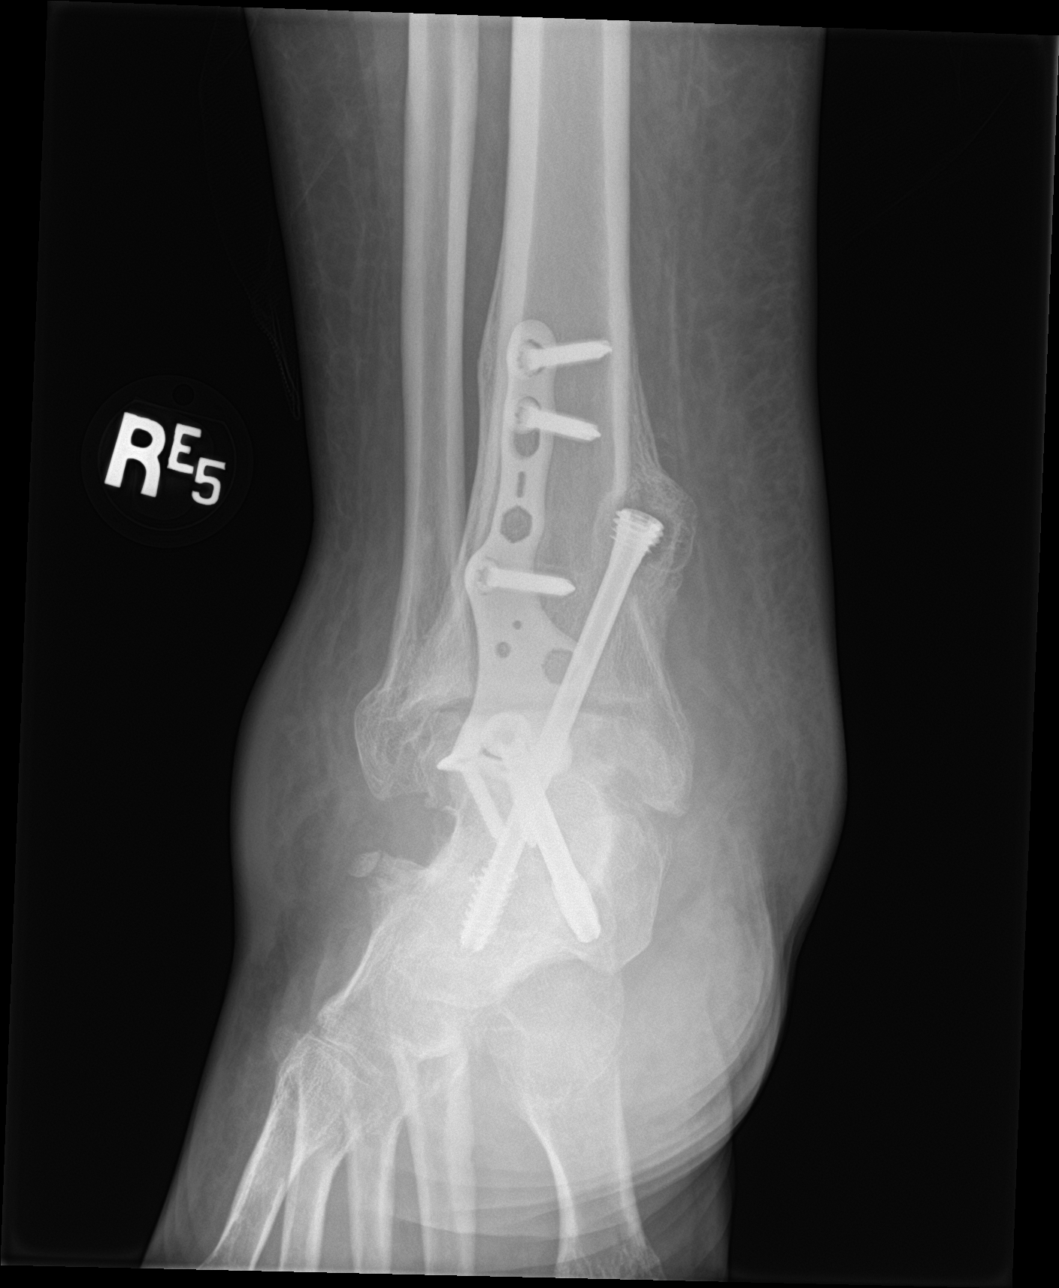

[ankle obl]
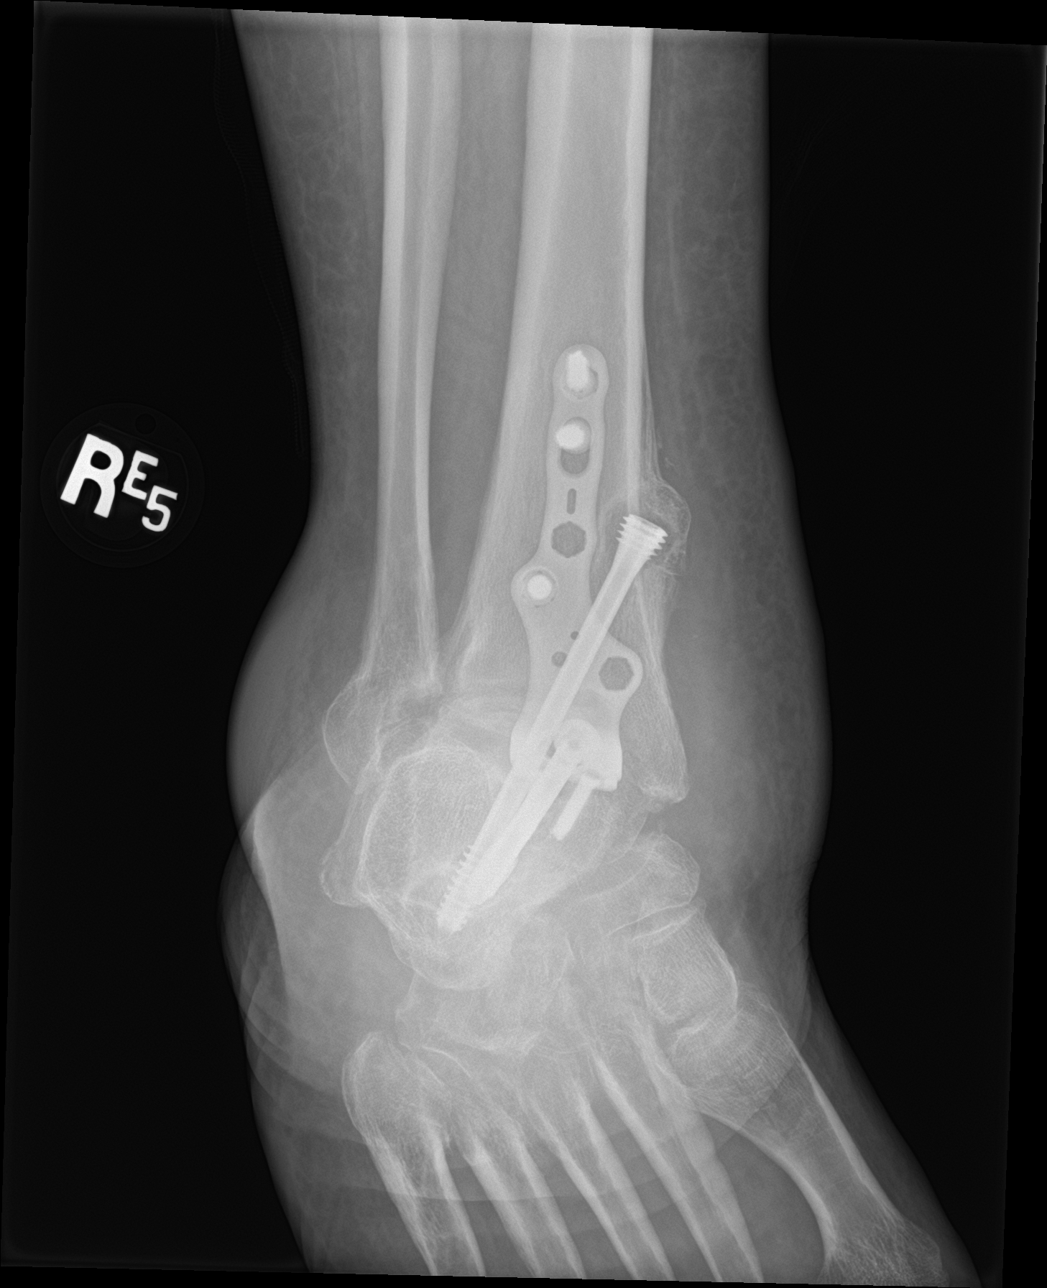

[ankle lat]
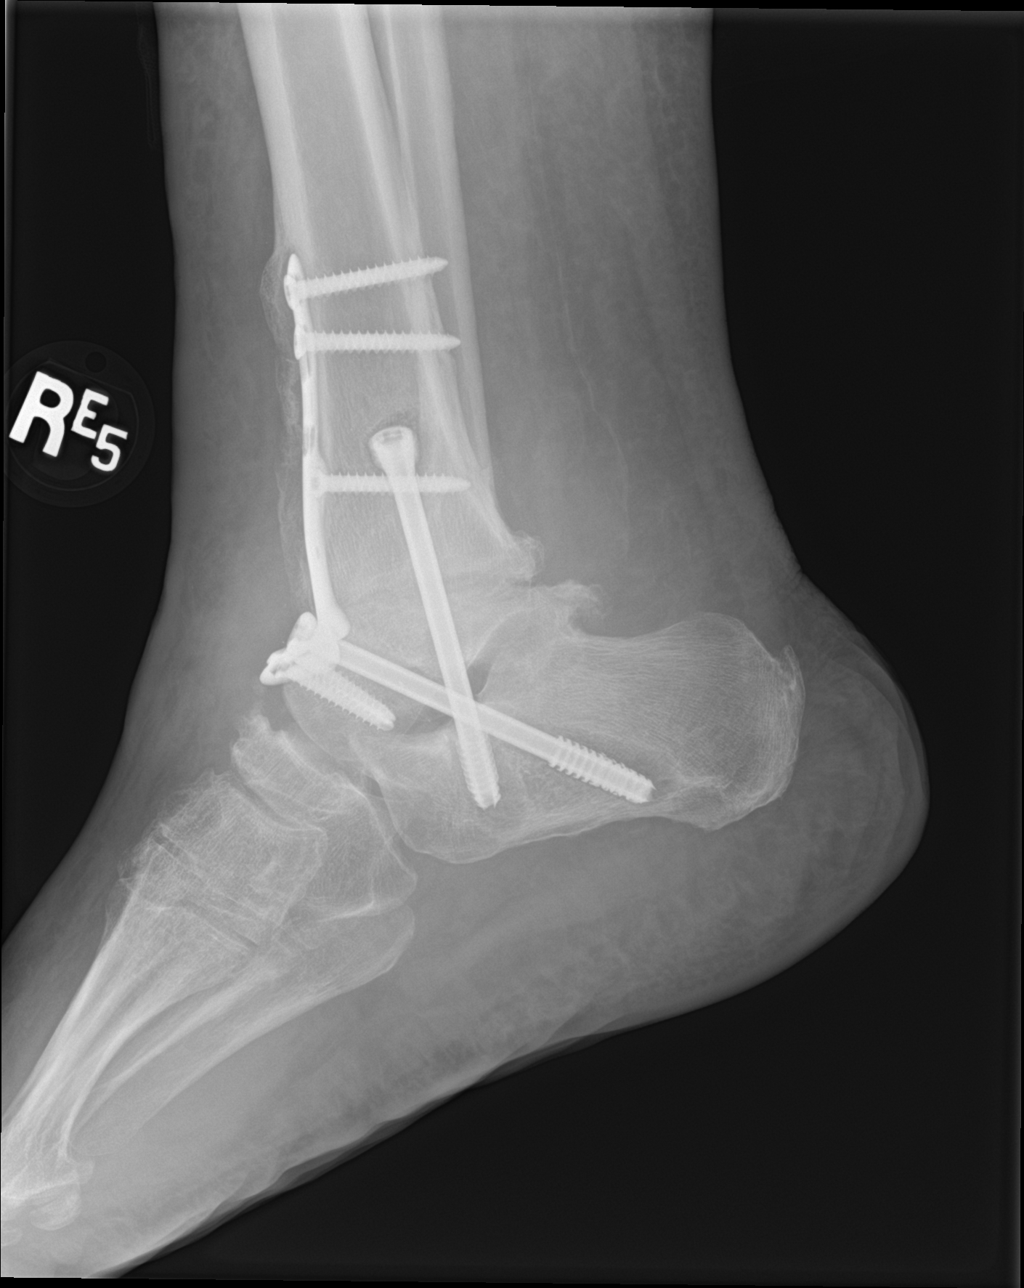

[3 of 3 positions shown; findings below may reference images not displayed]

FINDINGS: Tibiotalar fusion with plate and screw fixator. There are 2 lag
screws spanning the subtalar joint, both which are demonstrating
increased lucency especially around the threaded portion of the
screw fixators indicating loosening or infection. The vertically
oriented screw extending through the proximal tibia, talus, and into
the neck of the calcaneus also has a proximal threaded portion with
surrounding lucency.

Posterior subtalar joint spurring posteriorly. There is also notable
dorsal spurring in the midfoot. Equivocal widening of the
talonavicular joint. Soft tissue density anterior to the fused
tibiotalar joint on the lateral projection along with progressive
subcutaneous edema overlying the malleoli, especially laterally.
Scalloped medial margin of the lateral malleolus appears chronic.
Progressive thick periostitis in the distal tibia.

Achilles calcaneal spur noted.
IMPRESSION: 1. Increased subcutaneous edema diffusely along the ankle.
2. Progressive lucency along the threaded portions of the subtalar
lag screws favoring lucency or infection. The vertical
tibia-talus-calcaneal screw also has proximal threading with
surrounding progressive lucency and adjacent periostitis.
3. Increased periostitis in the distal tibia, with ossification now
extending anterior to the anterior tibiotalar fusion plate.
4. Substantial dorsal midfoot spurring.
5. Mild prominence of the talonavicular joint with reason the
possibility of Chopart joint effusion.

## 2023-09-20 ENCOUNTER — Encounter: Payer: Self-pay | Admitting: Family Medicine

## 2023-09-21 ENCOUNTER — Telehealth: Payer: Self-pay

## 2023-09-21 ENCOUNTER — Ambulatory Visit: Payer: Self-pay

## 2023-09-21 ENCOUNTER — Ambulatory Visit
Admission: RE | Admit: 2023-09-21 | Discharge: 2023-09-21 | Disposition: A | Discharge status: Home / Self Care | Source: Ambulatory Visit | Attending: Nurse Practitioner | Admitting: Nurse Practitioner

## 2023-09-21 ENCOUNTER — Telehealth: Payer: Self-pay | Admitting: Nurse Practitioner

## 2023-09-21 ENCOUNTER — Ambulatory Visit (HOSPITAL_COMMUNITY)
Admission: RE | Admit: 2023-09-21 | Discharge: 2023-09-21 | Disposition: A | Discharge status: Home / Self Care | Source: Ambulatory Visit | Attending: *Deleted | Admitting: *Deleted

## 2023-09-21 VITALS — BP 123/76 | HR 76 | Temp 98.0°F | Resp 18

## 2023-09-21 DIAGNOSIS — R600 Localized edema: Secondary | ICD-10-CM | POA: Diagnosis not present

## 2023-09-21 DIAGNOSIS — L03116 Cellulitis of left lower limb: Secondary | ICD-10-CM

## 2023-09-21 DIAGNOSIS — M7989 Other specified soft tissue disorders: Secondary | ICD-10-CM | POA: Insufficient documentation

## 2023-09-21 MED ORDER — DOXYCYCLINE HYCLATE 100 MG PO TABS: 100.0000 mg | ORAL_TABLET | Freq: Two times a day (BID) | ORAL | 0 refills | Status: AC

## 2023-09-21 NOTE — ED Provider Notes (Signed)
 [facility]    CSN: [id number] Arrival date & time: 09/21/23  1238      History   Chief Complaint Chief Complaint  Patient presents with   Leg Swelling    Entered by patient    HPI Jamie Frost is a 48 y.o. male.   The history is provided by the patient.   Patient presents for complaints of swelling of the left lower extremity that is been present for the past week.  Patient also complains of redness and peeling of skin in the left lower extremity.  He denies fever, chills, shortness of breath, difficulty breathing, chest pain, abdominal pain, injury, or trauma.  Patient further denies recent travel, or history of smoking.  Of note, he does have history of diabetes, hyperlipidemia, and hypertension.  Patient also with history of pulmonary hypoplasia and obesity..  Patient does wear TED hose to the bilateral lower extremities at baseline.  He also ambulates with a cane.  Patient's family reports patient did reach out to his PCP and advised that he be seen at urgent care as there were no available appointments.  Past Medical History:  Diagnosis Date   Allergic rhinitis, seasonal    Ambulates with cane    straight   Arthritis    Diabetes mellitus without complication (HCC)    type 2 - no meds   Hyperlipidemia    Hypertension    Impaired fasting glucose 12/09/2009   Obesity    Pulmonary hypoplasia    Right leg injury    wears brace LLE - per pt foot turns over without brace    Patient Active Problem List   Diagnosis Date Noted   Special screening, prostate cancer 07/24/2023   Hearing reduced 11/12/2022   Immunization due 11/07/2022   Excessive weight loss 11/07/2022   Tinea capitis 08/22/2022   Vitamin D  deficiency 07/19/2022   Right hip pain 06/22/2022   Fall 06/22/2022   Urinary frequency 06/22/2022   Incontinence 06/22/2022   Hematuria 06/22/2022   Incontinence of feces 06/22/2022   Lumbar back pain with radiculopathy affecting left lower  extremity 04/04/2022   Encounter for Medicare annual examination with abnormal findings 08/26/2021   Ankle instability, right    Acquired equinovarus deformity of left foot    Spastic diplegic cerebral palsy (HCC) 07/16/2018   Equinus contracture of left ankle 07/16/2018   Cavovarus deformity of foot, acquired, left 07/16/2018   Recurrent falls while walking 07/10/2018   Prediabetes 08/12/2016   Pulmonary hypoplasia 09/02/2010   Generalized anxiety disorder 10/11/2008   Hyperlipidemia LDL goal <100 09/23/2007   Morbid obesity (HCC) 09/23/2007   ALLERGIC RHINITIS, SEASONAL 09/23/2007    Past Surgical History:  Procedure Laterality Date   ANKLE FUSION Left 08/09/2018   Procedure: LEFT TIBIO-CALCANEAL FUSION;  Surgeon: Harden Jerona GAILS, MD;  Location: [facility] OR;  Service: Orthopedics;  Laterality: Left;   ANKLE FUSION Right 06/25/2020   Procedure: RIGHT TIBIOCALCANEAL FUSION;  Surgeon: Harden Jerona GAILS, MD;  Location: [facility] OR;  Service: Orthopedics;  Laterality: Right;   bilateral inguinal hernia     COLONOSCOPY WITH PROPOFOL  N/A 03/17/2021   Procedure: COLONOSCOPY WITH PROPOFOL ;  Surgeon: Shaaron Lamar HERO, MD;  Location: [facility];  Service: Endoscopy;  Laterality: N/A;  10:15am   I/D abcess on back  2007   1 WEEK    right great toe surgery         Home Medications    Prior to Admission medications   Medication  Sig Start Date End Date Taking? Authorizing Provider  busPIRone  (BUSPAR ) 7.5 MG tablet TAKE 1 TABLET BY MOUTH TWICE  DAILY 04/23/23  Yes Antonetta Rollene BRAVO, MD  ferrous sulfate 325 (65 FE) MG tablet Take 325 mg by mouth daily with breakfast.   Yes [provider]  fluticasone  (FLONASE ) 50 MCG/ACT nasal spray Place 2 sprays into both nostrils daily. 11/10/22  Yes Vivienne Delon HERO, PA-C  ketoconazole  (NIZORAL ) 2 % shampoo Apply 1 Application topically 2 (two) times a week. 07/26/23  Yes Antonetta Rollene BRAVO, MD  montelukast  (SINGULAIR ) 10 MG tablet TAKE 1 TABLET BY MOUTH  EVERYDAY AT BEDTIME 06/18/23  Yes Antonetta Rollene BRAVO, MD  simvastatin  (ZOCOR ) 40 MG tablet TAKE 1 TABLET BY MOUTH AT  BEDTIME 08/01/23  Yes Antonetta Rollene BRAVO, MD  UNABLE TO FIND Med Name: [place] x 1 Dx: rosalynd 04/18/22   Antonetta Rollene BRAVO, MD    Family History Family History  Problem Relation Age of Onset   Obesity Mother    Hypertension Mother    Arthritis Mother    Colon cancer Neg Hx    Colon polyps Neg Hx     Social History Social History   Tobacco Use   Smoking status: Never   Smokeless tobacco: Never  Vaping Use   Vaping status: Never Used  Substance Use Topics   Alcohol use: No   Drug use: No     Allergies   Sulfonamide derivatives   Review of Systems Review of Systems Per HPI  Physical Exam Triage Vital Signs ED Triage Vitals  Encounter Vitals Group     BP 09/21/23 1245 123/76     Girls Systolic BP Percentile --      Girls Diastolic BP Percentile --      Boys Systolic BP Percentile --      Boys Diastolic BP Percentile --      Pulse Rate 09/21/23 1245 76     Resp 09/21/23 1245 18     Temp 09/21/23 1245 98 F (36.7 C)     Temp Source 09/21/23 1245 Oral     SpO2 09/21/23 1245 95 %     Weight --      Height --      Head Circumference --      Peak Flow --      Pain Score 09/21/23 1247 0     Pain Loc --      Pain Education --      Exclude from Growth Chart --    No data found.  Updated Vital Signs BP 123/76 (BP Location: Right Arm)   Pulse 76   Temp 98 F (36.7 C) (Oral)   Resp 18   SpO2 95%   Visual Acuity Right Eye Distance:   Left Eye Distance:   Bilateral Distance:    Right Eye Near:   Left Eye Near:    Bilateral Near:     Physical Exam Vitals and nursing note reviewed.  Constitutional:      General: He is not in acute distress.    Appearance: Normal appearance.  HENT:     Head: Normocephalic.  Eyes:     Extraocular Movements: Extraocular movements intact.     Pupils: Pupils are equal, round, and reactive to light.   Cardiovascular:     Rate and Rhythm: Normal rate and regular rhythm.     Pulses: Normal pulses.     Heart sounds: Normal heart sounds.  Pulmonary:     Effort:  Pulmonary effort is normal.     Breath sounds: Normal breath sounds.  Abdominal:     General: Bowel sounds are normal.     Palpations: Abdomen is soft.  Musculoskeletal:     Cervical back: Normal range of motion.     Left lower leg: No deformity, lacerations or tenderness. Edema present.     Left ankle: No tenderness. Normal range of motion. Normal pulse.     Comments: Erythema noted to left lower extremity/ankle.   Skin:    General: Skin is warm and dry.  Neurological:     General: No focal deficit present.     Mental Status: He is alert and oriented to person, place, and time.  Psychiatric:        Mood and Affect: Mood normal.        Behavior: Behavior normal.      UC Treatments / Results  Labs (all labs ordered are listed, but only abnormal results are displayed) Labs Reviewed - No data to display  EKG   Radiology No results found.  Procedures Procedures (including critical care time)  Medications Ordered in UC Medications - No data to display  Initial Impression / Assessment and Plan / UC Course  I have reviewed the triage vital signs and the nursing notes.  Pertinent labs & imaging results that were available during my care of the patient were reviewed by me and considered in my medical decision making (see chart for details).  Ultrasound of the left lower extremity is pending.  Will treat for possible cellulitis if the ultrasound is negative for DVT with doxycycline  100 mg.  Supportive care recommendations were provided and discussed with patient's family to include continuing use of TED hose, elevating the lower extremities above the level of the heart is much as possible, and to monitor for worsening symptoms.  Indications for ER evaluation was provided to the patient's family.  Family was also advised  the patient should follow-up with his PCP within the next 5 to 7 days for reevaluation.  Family was in agreement with this plan of care and verbalizes understanding.  All questions were answered.  Patient stable for discharge.  Final Clinical Impressions(s) / UC Diagnoses   Final diagnoses:  Edema of left lower extremity     Discharge Instructions      Please go to [facility] for an ultrasound of his left lower leg.  You will need to go to the main entrance of the hospital to get to the radiology department.  You will be contacted when the results of the ultrasound are received.  You also have access to your results via MyChart. If the ultrasound of the left leg are negative, please start the antibiotics prescribed to cover for cellulitis. Continue use of TED hose as previously prescribed. Elevate the lower extremities above the level of the heart is much as possible to help with pain or swelling. Go to the emergency department immediately if he experiences increased redness, swelling, shortness of breath, chest pain, or difficulty breathing. Recommend follow-up with his PCP within the next 5 to 7 days for reevaluation. Follow-up as needed.     ED Prescriptions   None    PDMP not reviewed this encounter.   Gilmer Etta PARAS, NP 09/21/23 1334

## 2023-09-21 NOTE — ED Triage Notes (Signed)
 Pt presents with left leg and ankle swelling with redness and peeling of skin on left ankle. and x 1 week.

## 2023-09-21 NOTE — Telephone Encounter (Signed)
 FYI Only or Action Required?: FYI only for provider.  Patient was last seen in primary care on 07/24/2023 by Antonetta Rollene BRAVO, MD.  Called Nurse Triage reporting Leg Swelling.  Symptoms began several days ago.  Interventions attempted: Nothing.  Symptoms are: stable.  Triage Disposition: See HCP Within 4 Hours (Or PCP Triage)  Patient/caregiver understands and will follow disposition?: Yes                             Copied from CRM [id number]. Topic: Clinical - Red Word Triage >> Sep 21, 2023  9:40 AM Turkey B wrote: [facility] that prompted transfer to Nurse Triage: pt left leg is swollen and peeling Reason for Disposition  [1] Thigh, calf, or ankle swelling AND [2] only 1 side  Answer Assessment - Initial Assessment Questions 1. ONSET: When did the swelling start? (e.g., minutes, hours, days)     Couple days ago 2. LOCATION: What part of the leg is swollen?  Are both legs swollen or just one leg?     Left leg 3. SEVERITY: How bad is the swelling? (e.g., localized; mild, moderate, severe)     Foot/ankle to calf 4. REDNESS: Is there redness or signs of infection?     Redness and peeling 5. PAIN: Is the swelling painful to touch? If Yes, ask: How painful is it?   (Scale 1-10; mild, moderate or severe)     Denies  6. FEVER: Do you have a fever? If Yes, ask: What is it, how was it measured, and when did it start?      Denies 7. CAUSE: What do you think is causing the leg swelling?     Unsure  8. MEDICAL HISTORY: Do you have a history of blood clots (e.g., DVT), cancer, heart failure, kidney disease, or liver failure?     Denies 9. RECURRENT SYMPTOM: Have you had leg swelling before? If Yes, ask: When was the last time? What happened that time?     Swelling has happened before 10. OTHER SYMPTOMS: Do you have any other symptoms? (e.g., chest pain, difficulty breathing)     Denies pain, denies drainage, states patient is  able to walk normally, denies difficulty breathing, denies chest pain    No availability in office today. Advised UC. Patient going to UC today.  Protocols used: Leg Swelling and Edema-A-AH

## 2023-09-21 NOTE — Discharge Instructions (Signed)
 Please go to [facility] for an ultrasound of his left lower leg.  You will need to go to the main entrance of the hospital to get to the radiology department.  You will be contacted when the results of the ultrasound are received.  You also have access to your results via MyChart. If the ultrasound of the left leg are negative, please start the antibiotics prescribed to cover for cellulitis. Continue use of TED hose as previously prescribed. Elevate the lower extremities above the level of the heart is much as possible to help with pain or swelling. Go to the emergency department immediately if he experiences increased redness, swelling, shortness of breath, chest pain, or difficulty breathing. Recommend follow-up with his PCP within the next 5 to 7 days for reevaluation. Follow-up as needed.

## 2023-09-21 NOTE — Telephone Encounter (Signed)
 Called patients caregiver to inform them that the patient's ultrasound was negative for DVT. Informed them that Doxycycline  script was sent in to his preferred pharmacy for cellulitis. Caregiver verbalized understanding.  Pt identified with two identifiers.

## 2023-09-22 NOTE — Telephone Encounter (Signed)
 Prescription for doxycycline  100 mg sent to the patient's preferred pharmacy for lower extremity cellulitis.

## 2023-09-24 ENCOUNTER — Ambulatory Visit (HOSPITAL_COMMUNITY): Payer: Self-pay

## 2023-10-05 ENCOUNTER — Ambulatory Visit: Payer: Self-pay | Admitting: Nurse Practitioner

## 2023-10-16 ENCOUNTER — Ambulatory Visit (INDEPENDENT_AMBULATORY_CARE_PROVIDER_SITE_OTHER): Payer: Self-pay | Admitting: Family Medicine

## 2023-10-16 ENCOUNTER — Encounter: Payer: Self-pay | Admitting: Family Medicine

## 2023-10-16 VITALS — BP 121/78 | HR 79 | Resp 16 | Ht 67.0 in | Wt 216.0 lb

## 2023-10-16 DIAGNOSIS — E785 Hyperlipidemia, unspecified: Secondary | ICD-10-CM

## 2023-10-16 DIAGNOSIS — F411 Generalized anxiety disorder: Secondary | ICD-10-CM

## 2023-10-16 NOTE — Progress Notes (Signed)
   Jamie Frost     MRN: [id number]      DOB: [birth year]  Chief Complaint  Patient presents with   Follow-up    Follow up from UC on 07/18 for leg swelling, has resolved     HPI Jamie Frost is here for follow up of UC visit on 7/18 for swollen left leg, and re-evaluation of chronic medical conditions, medication management and review of any available recent lab and radiology data.  Records from visit are reviewed including venous do[pler report negative for dVT The PT denies any adverse reactions to current medications since the last visit.  There are no new concerns.  There are no specific complaints   ROS Denies recent fever or chills. Denies sinus pressure, nasal congestion, ear pain or sore throat. Denies chest congestion, productive cough or wheezing. Denies chest pains, palpitations and leg swelling Denies abdominal pain, nausea, vomiting,diarrhea or constipation.   Denies dysuria, frequency, hesitancy or incontinence. Denies uncontrolled joint pain, swelling and does have  limitation in mobility. Denies headaches, seizures, numbness, or tingling. Denies depression, anxiety or insomnia. Denies skin break down or rash.   PE  BP 121/78   Pulse 79   Resp 16   Ht 5' 7 (1.702 m)   Wt 216 lb (98 kg)   SpO2 97%   BMI 33.83 kg/m   Patient alert and oriented and in no cardiopulmonary distress.  HEENT: No facial asymmetry, EOMI,     Neck supple .  Chest: Clear to auscultation bilaterally.  CVS: S1, S2 no murmurs, no S3.Regular rate.  ABD: Soft non tender.   Ext: No edema  MS: decreased  ROM spine, shoulders, hips and knees.  Skin: Intact, erythematous open lesions on anterior leg resembling scratch marks, no drainage from areas  Psych: Good eye contact, normal affect. Memory intact not anxious or depressed appearing.  CNS: CN 2-12 intact, power,  normal throughout.no focal deficits noted.   Assessment & Plan  Hyperlipidemia LDL goal  <100 Hyperlipidemia:Low fat diet discussed and encouraged.   Lipid Panel  Lab Results  Component Value Date   CHOL 140 10/06/2022   HDL 72 10/06/2022   LDLCALC 58 10/06/2022   TRIG 41 10/06/2022   CHOLHDL 1.9 10/06/2022     Updated lab needed at/ before next visit.   Morbid obesity (HCC)  Patient re-educated about  the importance of commitment to a  minimum of 150 minutes of exercise per week as able.  The importance of healthy food choices with portion control discussed, as well as eating regularly and within a 12 hour window most days. The need to choose clean , green food 50 to 75% of the time is discussed, as well as to make water the primary drink and set a goal of 64 ounces water daily.       10/16/2023    1:42 PM 07/24/2023    9:52 AM 05/16/2023    3:05 PM  Weight /BMI  Weight 216 lb 225 lb 1.9 oz 231 lb  Height 5' 7 (1.702 m) 5' 7 (1.702 m) 5' 7 (1.702 m)  BMI 33.83 kg/m2 35.26 kg/m2 36.18 kg/m2    improved  Generalized anxiety disorder Controlled, no change in medication   Allergic rhinitis due to allergen Controlled, no change in medication

## 2023-10-16 NOTE — Assessment & Plan Note (Signed)
 Controlled, no change in medication

## 2023-10-16 NOTE — Patient Instructions (Addendum)
 Keep appointment as before  Hep B series to be arranged by Nursing staff at checkout Labs ordered May 20 today  Thanks for choosing [facility], we consider it a privelige to serve you.

## 2023-10-16 NOTE — Assessment & Plan Note (Signed)
 Hyperlipidemia:Low fat diet discussed and encouraged.   Lipid Panel  Lab Results  Component Value Date   CHOL 140 10/06/2022   HDL 72 10/06/2022   LDLCALC 58 10/06/2022   TRIG 41 10/06/2022   CHOLHDL 1.9 10/06/2022     Updated lab needed at/ before next visit.

## 2023-10-16 NOTE — Assessment & Plan Note (Signed)
  Patient re-educated about  the importance of commitment to a  minimum of 150 minutes of exercise per week as able.  The importance of healthy food choices with portion control discussed, as well as eating regularly and within a 12 hour window most days. The need to choose clean , green food 50 to 75% of the time is discussed, as well as to make water the primary drink and set a goal of 64 ounces water daily.       10/16/2023    1:42 PM 07/24/2023    9:52 AM 05/16/2023    3:05 PM  Weight /BMI  Weight 216 lb 225 lb 1.9 oz 231 lb  Height 5' 7 (1.702 m) 5' 7 (1.702 m) 5' 7 (1.702 m)  BMI 33.83 kg/m2 35.26 kg/m2 36.18 kg/m2    improved

## 2023-10-17 ENCOUNTER — Ambulatory Visit: Payer: Self-pay | Admitting: Family Medicine

## 2023-10-17 LAB — CBC WITH DIFFERENTIAL/PLATELET
Basophils Absolute: 0 x10E3/uL (ref 0.0–0.2)
Basos: 1 %
EOS (ABSOLUTE): 0.1 x10E3/uL (ref 0.0–0.4)
Eos: 1 %
Hematocrit: 42.3 % (ref 37.5–51.0)
Hemoglobin: 12.7 g/dL — ABNORMAL LOW (ref 13.0–17.7)
Immature Grans (Abs): 0 x10E3/uL (ref 0.0–0.1)
Immature Granulocytes: 0 %
Lymphocytes Absolute: 1.8 x10E3/uL (ref 0.7–3.1)
Lymphs: 23 %
MCH: 27 pg (ref 26.6–33.0)
MCHC: 30 g/dL — ABNORMAL LOW (ref 31.5–35.7)
MCV: 90 fL (ref 79–97)
Monocytes Absolute: 0.8 x10E3/uL (ref 0.1–0.9)
Monocytes: 10 %
Neutrophils Absolute: 5 x10E3/uL (ref 1.4–7.0)
Neutrophils: 65 %
Platelets: 247 x10E3/uL (ref 150–450)
RBC: 4.7 x10E6/uL (ref 4.14–5.80)
RDW: 13.6 % (ref 11.6–15.4)
WBC: 7.8 x10E3/uL (ref 3.4–10.8)

## 2023-10-17 LAB — CMP14+EGFR
ALT: 24 IU/L (ref 0–44)
AST: 27 IU/L (ref 0–40)
Albumin: 4.5 g/dL (ref 4.1–5.1)
Alkaline Phosphatase: 110 IU/L (ref 44–121)
BUN/Creatinine Ratio: 14 (ref 9–20)
BUN: 14 mg/dL (ref 6–24)
Bilirubin Total: 0.7 mg/dL (ref 0.0–1.2)
CO2: 25 mmol/L (ref 20–29)
Calcium: 9.9 mg/dL (ref 8.7–10.2)
Chloride: 102 mmol/L (ref 96–106)
Creatinine, Ser: 0.97 mg/dL (ref 0.76–1.27)
Globulin, Total: 3.2 g/dL (ref 1.5–4.5)
Glucose: 92 mg/dL (ref 70–99)
Potassium: 5.1 mmol/L (ref 3.5–5.2)
Sodium: 140 mmol/L (ref 134–144)
Total Protein: 7.7 g/dL (ref 6.0–8.5)
eGFR: 96 mL/min/1.73 (ref >59)

## 2023-10-17 LAB — PSA: Prostate Specific Ag, Serum: 0.6 ng/mL (ref 0.0–4.0)

## 2023-10-17 LAB — LIPID PANEL
Chol/HDL Ratio: 2 ratio (ref 0.0–5.0)
Cholesterol, Total: 146 mg/dL (ref 100–199)
HDL: 74 mg/dL (ref >39)
LDL Chol Calc (NIH): 63 mg/dL (ref 0–99)
Triglycerides: 37 mg/dL (ref 0–149)
VLDL Cholesterol Cal: 9 mg/dL (ref 5–40)

## 2023-10-17 LAB — VITAMIN D 25 HYDROXY (VIT D DEFICIENCY, FRACTURES): Vit D, 25-Hydroxy: 28.2 ng/mL — ABNORMAL LOW (ref 30.0–100.0)

## 2023-10-17 LAB — HEMOGLOBIN A1C
Est. average glucose Bld gHb Est-mCnc: 114 mg/dL
Hgb A1c MFr Bld: 5.6 % (ref 4.8–5.6)

## 2023-10-21 ENCOUNTER — Other Ambulatory Visit: Payer: Self-pay | Admitting: Family Medicine

## 2023-11-22 ENCOUNTER — Encounter: Admitting: Family Medicine

## 2023-12-27 ENCOUNTER — Ambulatory Visit: Admitting: Orthopedic Surgery

## 2023-12-27 ENCOUNTER — Encounter: Payer: Self-pay | Admitting: Orthopedic Surgery

## 2023-12-27 ENCOUNTER — Ambulatory Visit (INDEPENDENT_AMBULATORY_CARE_PROVIDER_SITE_OTHER): Admitting: Orthopedic Surgery

## 2023-12-27 VITALS — BP 121/78 | Ht 67.0 in | Wt 216.0 lb

## 2023-12-27 DIAGNOSIS — M5410 Radiculopathy, site unspecified: Secondary | ICD-10-CM | POA: Diagnosis not present

## 2023-12-27 DIAGNOSIS — M51361 Other intervertebral disc degeneration, lumbar region with lower extremity pain only: Secondary | ICD-10-CM | POA: Diagnosis not present

## 2023-12-27 MED ORDER — IBUPROFEN 400 MG PO TABS: 400.0000 mg | ORAL_TABLET | Freq: Three times a day (TID) | ORAL | 0 refills | Status: DC | PRN

## 2023-12-27 MED ORDER — GABAPENTIN 100 MG PO CAPS: 100.0000 mg | ORAL_CAPSULE | Freq: Three times a day (TID) | ORAL | 2 refills | Status: DC

## 2023-12-27 NOTE — Progress Notes (Signed)
    12/27/2023   Chief Complaint  Patient presents with   Back Pain    Down left leg all the way down leg     No diagnosis found.  What pharmacy do you use ? _____[facility] ______________________  DOI/DOS/ Date: ongoing   Unchanged

## 2023-12-27 NOTE — Patient Instructions (Signed)
 While we are working on your approval for MRI please go ahead and call to schedule your appointment with [name] within at least one (1) week.   Central Scheduling [phone]

## 2023-12-27 NOTE — Progress Notes (Signed)
   Patient: Jamie Frost           Date of Birth: 1975-07-17           MRN: [id number] Visit Date: 12/27/2023 Requested by: Antonetta Rollene BRAVO, MD [address] [place],  [place] [address] PCP: Antonetta Rollene BRAVO, MD  Encounter Diagnoses  Name Primary?   Degeneration of intervertebral disc of lumbar region with lower extremity pain Yes   Radicular pain of left lower extremity     Assessment and plan:  48 year old male presents with worsening lower back and left leg pain after a course of physical therapy and Tylenol  for medication  Recommend MRI to define the neural elements and to see which nerve is actually bothering him it is presumptively L5   Meds ordered this encounter  Medications   ibuprofen (ADVIL) 400 MG tablet    Sig: Take 1 tablet (400 mg total) by mouth every 8 (eight) hours as needed.    Dispense:  90 tablet    Refill:  0   gabapentin  (NEURONTIN ) 100 MG capsule    Sig: Take 1 capsule (100 mg total) by mouth 3 (three) times daily.    Dispense:  90 capsule    Refill:  2   Return after scan  Chief Complaint  Patient presents with   Back Pain    Down left leg all the way down leg     History:  48 year old male with some type of mental deficiency but is very active and knowledgeable about his condition presents back to us  with consistent persistent and worsening left leg pain from his lower back to his knee  He has had a course of Tylenol   He had physical therapy he did not improve  Focused exam findings:  Lumbar spine really nontender lumbar spine negative exam of his left hip in terms of range of motion. Does use a cane His reflexes were good His sensation was intact The only positive reaction we had was when we did a straight leg raise at 45 degrees  No results found.   Imaging was done previously and he has degenerative disc disease FINDINGS: There is no evidence of lumbar spine fracture. Alignment is normal. Intervertebral disc  spaces are maintained. Mild endplate osteophytes are seen at L4-L5 at L5-S1. Soft tissues are within normal limits.   IMPRESSION: Mild degenerative changes at L4-L5 and L5-S1.     Electronically Signed   By: Greig Pique M.D.   On: 04/04/2022 23:02

## 2023-12-31 ENCOUNTER — Ambulatory Visit: Admitting: Orthopedic Surgery

## 2024-01-08 ENCOUNTER — Ambulatory Visit (HOSPITAL_COMMUNITY)
Admission: RE | Admit: 2024-01-08 | Discharge: 2024-01-08 | Disposition: A | Discharge status: Home / Self Care | Source: Ambulatory Visit | Attending: Orthopedic Surgery | Admitting: Orthopedic Surgery

## 2024-01-08 DIAGNOSIS — M47816 Spondylosis without myelopathy or radiculopathy, lumbar region: Secondary | ICD-10-CM | POA: Diagnosis not present

## 2024-01-08 DIAGNOSIS — M4316 Spondylolisthesis, lumbar region: Secondary | ICD-10-CM

## 2024-01-08 DIAGNOSIS — M48061 Spinal stenosis, lumbar region without neurogenic claudication: Secondary | ICD-10-CM | POA: Diagnosis not present

## 2024-01-08 DIAGNOSIS — M5410 Radiculopathy, site unspecified: Secondary | ICD-10-CM | POA: Insufficient documentation

## 2024-01-08 DIAGNOSIS — M51361 Other intervertebral disc degeneration, lumbar region with lower extremity pain only: Secondary | ICD-10-CM | POA: Diagnosis present

## 2024-01-16 ENCOUNTER — Other Ambulatory Visit: Payer: Self-pay | Admitting: Orthopedic Surgery

## 2024-01-16 DIAGNOSIS — M51361 Other intervertebral disc degeneration, lumbar region with lower extremity pain only: Secondary | ICD-10-CM

## 2024-01-16 DIAGNOSIS — M5410 Radiculopathy, site unspecified: Secondary | ICD-10-CM

## 2024-02-23 ENCOUNTER — Encounter: Payer: Self-pay | Admitting: Family Medicine

## 2024-02-25 ENCOUNTER — Telehealth: Admitting: Physician Assistant

## 2024-02-25 ENCOUNTER — Encounter: Payer: Self-pay | Admitting: Family Medicine

## 2024-02-25 ENCOUNTER — Other Ambulatory Visit: Payer: Self-pay

## 2024-02-25 DIAGNOSIS — R600 Localized edema: Secondary | ICD-10-CM

## 2024-02-25 DIAGNOSIS — L03115 Cellulitis of right lower limb: Secondary | ICD-10-CM

## 2024-02-25 MED ORDER — BUSPIRONE HCL 7.5 MG PO TABS: 7.5000 mg | ORAL_TABLET | Freq: Two times a day (BID) | ORAL | 2 refills | Status: AC

## 2024-02-25 NOTE — Progress Notes (Signed)
" °  Because of substantial leg swelling in addition to the infection, I feel your condition warrants further evaluation and I recommend that you be seen in a face-to-face visit.   NOTE: There will be NO CHARGE for this E-Visit   If you are having a true medical emergency, please call 911.     For an urgent face to face visit, [facility] Frost has multiple urgent care centers for your convenience.  Click the link below for the full list of locations and hours, walk-in wait times, appointment scheduling options and driving directions:  Urgent Care - [place], [place], [place], [place], [place], [place]  [place]     Your MyChart E-visit questionnaire answers were reviewed by a board certified advanced clinical practitioner to complete your personal care plan based on your specific symptoms.    Thank you for using e-Visits.    "

## 2024-02-25 NOTE — Telephone Encounter (Signed)
 Appt made.

## 2024-02-26 ENCOUNTER — Ambulatory Visit

## 2024-02-26 VITALS — BP 116/75 | HR 90 | Resp 18 | Ht 67.0 in | Wt 214.1 lb

## 2024-02-26 DIAGNOSIS — L03115 Cellulitis of right lower limb: Secondary | ICD-10-CM | POA: Diagnosis not present

## 2024-02-26 DIAGNOSIS — R600 Localized edema: Secondary | ICD-10-CM | POA: Diagnosis not present

## 2024-02-26 MED ORDER — CEPHALEXIN 500 MG PO CAPS: 500.0000 mg | ORAL_CAPSULE | Freq: Three times a day (TID) | ORAL | 0 refills | Status: AC

## 2024-02-26 NOTE — Progress Notes (Signed)
 "  Established Patient Office Visit  Subjective   Patient ID: Jamie Frost, male    DOB: 1975-09-09  Age: 48 y.o. MRN: [id number]  Chief Complaint  Patient presents with   Leg Swelling    Pt presents with rt leg swelling and redness that started 5 days ago    HPI  Discussed the use of AI scribe software for clinical note transcription with the patient, who gave verbal consent to proceed.  History of Present Illness    Jamie Frost is a 48 year old male who presents with redness and swelling of the leg. He is accompanied by his father.  Lower extremity redness and swelling - Redness and swelling of the leg for the past three weeks - Swelling is intermittent - No drainage from the affected area - Consistently wears compression socks, applying them first thing in the morning - Elevates leg regularly - No issues with the contralateral leg  Pruritic skin lesions - Itchy spots present on arm and leg - Manages pruritus with lotion - Dermatology appointment scheduled for March next year     Patient Active Problem List   Diagnosis Date Noted   Cellulitis of right lower extremity 03/03/2024   Lower extremity edema 03/03/2024   Special screening, prostate cancer 07/24/2023   Hearing reduced 11/12/2022   Immunization due 11/07/2022   Excessive weight loss 11/07/2022   Tinea capitis 08/22/2022   Vitamin D  deficiency 07/19/2022   Right hip pain 06/22/2022   Fall 06/22/2022   Urinary frequency 06/22/2022   Incontinence 06/22/2022   Hematuria 06/22/2022   Incontinence of feces 06/22/2022   Lumbar back pain with radiculopathy affecting left lower extremity 04/04/2022   Encounter for Medicare annual examination with abnormal findings 08/26/2021   Ankle instability, right    Acquired equinovarus deformity of left foot    Spastic diplegic cerebral palsy (HCC) 07/16/2018   Equinus contracture of left ankle 07/16/2018   Cavovarus deformity of foot, acquired, left  07/16/2018   Recurrent falls while walking 07/10/2018   Prediabetes 08/12/2016   Pulmonary hypoplasia 09/02/2010   Generalized anxiety disorder 10/11/2008   Hyperlipidemia LDL goal <100 09/23/2007   Morbid obesity (HCC) 09/23/2007   Allergic rhinitis due to allergen 09/23/2007    ROS    Objective:     BP 116/75   Pulse 90   Resp 18   Ht 5' 7 (1.702 m)   Wt 214 lb 1.9 oz (97.1 kg)   SpO2 99%   BMI 33.54 kg/m  BP Readings from Last 3 Encounters:  02/26/24 116/75  12/27/23 121/78  10/16/23 121/78   Wt Readings from Last 3 Encounters:  02/26/24 214 lb 1.9 oz (97.1 kg)  12/27/23 216 lb (98 kg)  10/16/23 216 lb (98 kg)      Physical Exam Vitals and nursing note reviewed.  Constitutional:      Appearance: Normal appearance.  HENT:     Head: Normocephalic.  Eyes:     Extraocular Movements: Extraocular movements intact.     Pupils: Pupils are equal, round, and reactive to light.  Cardiovascular:     Rate and Rhythm: Normal rate and regular rhythm.  Pulmonary:     Effort: Pulmonary effort is normal.     Breath sounds: Normal breath sounds.  Musculoskeletal:     Cervical back: Normal range of motion and neck supple.  Neurological:     Mental Status: He is alert and oriented to person, place, and time.  Psychiatric:  Mood and Affect: Mood normal.        Thought Content: Thought content normal.        The 10-year ASCVD risk score (Arnett DK, et al., 2019) is: 3.4%    Assessment & Plan:   Problem List Items Addressed This Visit       Other   Cellulitis of right lower extremity - Primary   Cellulitis with redness and swelling for three weeks. No drainage. Allergic to sulfa drugs. - Prescribed antibiotic TID for one week. - Advised leg elevation when sitting. - Recommended discontinuing compression socks until resolution. - Ordered labs for kidney function, electrolytes, and BMP.      Relevant Medications   cephALEXin  (KEFLEX ) 500 MG capsule    Other Relevant Orders   Basic Metabolic Panel (BMET) (Completed)   B Nat Peptide (Completed)   Lower extremity edema   Cellulitis with redness and swelling for three weeks. No drainage. Allergic to sulfa drugs. - Prescribed antibiotic TID for one week. - Advised leg elevation when sitting. - Recommended discontinuing compression socks until resolution. - Ordered labs for kidney function, electrolytes, and BMP.      Relevant Orders   Basic Metabolic Panel (BMET) (Completed)   No follow-ups on file.    Leita Longs, FNP  "

## 2024-02-27 ENCOUNTER — Other Ambulatory Visit: Payer: Self-pay | Admitting: Orthopedic Surgery

## 2024-02-27 DIAGNOSIS — M5410 Radiculopathy, site unspecified: Secondary | ICD-10-CM

## 2024-02-27 DIAGNOSIS — M51361 Other intervertebral disc degeneration, lumbar region with lower extremity pain only: Secondary | ICD-10-CM

## 2024-02-29 LAB — BASIC METABOLIC PANEL WITH GFR
BUN/Creatinine Ratio: 14 (ref 9–20)
BUN: 13 mg/dL (ref 6–24)
CO2: 24 mmol/L (ref 20–29)
Calcium: 9.5 mg/dL (ref 8.7–10.2)
Chloride: 99 mmol/L (ref 96–106)
Creatinine, Ser: 0.9 mg/dL (ref 0.76–1.27)
Glucose: 87 mg/dL (ref 70–99)
Potassium: 4.6 mmol/L (ref 3.5–5.2)
Sodium: 140 mmol/L (ref 134–144)
eGFR: 105 mL/min/1.73

## 2024-02-29 LAB — BRAIN NATRIURETIC PEPTIDE: BNP: 15.4 pg/mL (ref 0.0–100.0)

## 2024-03-01 ENCOUNTER — Other Ambulatory Visit: Payer: Self-pay | Admitting: Family Medicine

## 2024-03-03 ENCOUNTER — Ambulatory Visit: Payer: Self-pay

## 2024-03-03 DIAGNOSIS — L03115 Cellulitis of right lower limb: Secondary | ICD-10-CM | POA: Insufficient documentation

## 2024-03-03 DIAGNOSIS — R600 Localized edema: Secondary | ICD-10-CM | POA: Insufficient documentation

## 2024-03-03 NOTE — Assessment & Plan Note (Signed)
 Cellulitis with redness and swelling for three weeks. No drainage. Allergic to sulfa drugs. - Prescribed antibiotic TID for one week. - Advised leg elevation when sitting. - Recommended discontinuing compression socks until resolution. - Ordered labs for kidney function, electrolytes, and BMP.

## 2024-03-05 ENCOUNTER — Encounter: Payer: Self-pay | Admitting: Family Medicine

## 2024-03-05 ENCOUNTER — Ambulatory Visit (INDEPENDENT_AMBULATORY_CARE_PROVIDER_SITE_OTHER): Admitting: Family Medicine

## 2024-03-05 VITALS — BP 120/72 | HR 76 | Resp 16 | Ht 67.0 in | Wt 215.0 lb

## 2024-03-05 DIAGNOSIS — E785 Hyperlipidemia, unspecified: Secondary | ICD-10-CM | POA: Diagnosis not present

## 2024-03-05 DIAGNOSIS — Z0001 Encounter for general adult medical examination with abnormal findings: Secondary | ICD-10-CM | POA: Diagnosis not present

## 2024-03-05 DIAGNOSIS — Z23 Encounter for immunization: Secondary | ICD-10-CM | POA: Diagnosis not present

## 2024-03-05 MED ORDER — SIMVASTATIN 20 MG PO TABS: 20.0000 mg | ORAL_TABLET | Freq: Every day | ORAL | 1 refills | Status: AC

## 2024-03-05 NOTE — Progress Notes (Unsigned)
" ° °  Jamie Frost     MRN: [id number]      DOB: Aug 04, 1975  Chief Complaint  Patient presents with   Annual Exam    Cpe     HPI: Patient is in for annual physical exam. No other health concerns are expressed or addressed at the visit. Recent labs, if available are reviewed. Immunization is reviewed , and  updated if needed.    PE; Pleasant male, alert and oriented x 3, in no cardio-pulmonary distress. Afebrile. HEENT No facial trauma or asymetry. Sinuses non tender. EOMI External ears normal,  Neck: supple, no adenopathy,JVD or thyromegaly.No bruits.  Chest: Clear to ascultation bilaterally.No crackles or wheezes. Non tender to palpation  Cardiovascular system; Heart sounds normal,  S1 and  S2 ,no S3.  No murmur, or thrill. Apical beat not displaced Peripheral pulses normal.  Abdomen: Soft, non tender, no organomegaly or masses. No bruits. Bowel sounds normal. No guarding, tenderness or rebound.    Musculoskeletal exam: Full ROM of spine, hips , shoulders and knees. No deformity ,swelling or crepitus noted. No muscle wasting or atrophy.   Neurologic: Cranial nerves 2 to 12 intact. Power, tone ,sensation and reflexes normal throughout. No disturbance in gait. No tremor.  Skin: Intact, no ulceration, erythema , scaling or rash noted. Pigmentation normal throughout  Psych; Normal mood and affect. Judgement and concentration normal   Assessment & Plan:  No problem-specific Assessment & Plan notes found for this encounter.  "

## 2024-03-05 NOTE — Patient Instructions (Addendum)
 F/U mid  March   Flu and Hep B#1 today  Nurse visit for Hep B#2 in 2 months  Fasting lipid, cmp and EGFr , CBC,  hBA1C and TSH 1 week before next visit  REduce dose of simvasatin to 20 mg every day, start today if you get the new medication and stop the 40 mg tablet. New script is sent to [facility] in [facility] on excellent labs and continued weight loss keep up the great work1  All the best for 2026!  Thanks for choosing [facility], we consider it a privelige to serve you.

## 2024-03-06 ENCOUNTER — Encounter: Payer: Self-pay | Admitting: Family Medicine

## 2024-03-06 NOTE — Assessment & Plan Note (Signed)
 Hyperlipidemia:Low fat diet discussed and encouraged.   Lipid Panel  Lab Results  Component Value Date   CHOL 146 10/16/2023   HDL 74 10/16/2023   LDLCALC 63 10/16/2023   TRIG 37 10/16/2023   CHOLHDL 2.0 10/16/2023     Reduce dose of zocor 

## 2024-03-06 NOTE — Assessment & Plan Note (Signed)
 After obtaining informed consent, the influenza and Hep B #1  vaccines are   administered , with no adverse effect noted at the time of administration.

## 2024-03-06 NOTE — Assessment & Plan Note (Signed)

## 2024-03-10 ENCOUNTER — Ambulatory Visit: Payer: Self-pay

## 2024-04-29 ENCOUNTER — Ambulatory Visit: Payer: Self-pay

## 2024-05-20 ENCOUNTER — Ambulatory Visit

## 2024-05-28 ENCOUNTER — Ambulatory Visit: Payer: Self-pay | Admitting: Family Medicine
